# Patient Record
Sex: Male | Born: 1945
Health system: Southern US, Community
[De-identification: ages and names within clinical notes are randomized; demographics above are authoritative.]

## PROBLEM LIST (undated history)

## (undated) DIAGNOSIS — I10 Essential (primary) hypertension: Secondary | ICD-10-CM

## (undated) DIAGNOSIS — G8929 Other chronic pain: Secondary | ICD-10-CM

## (undated) DIAGNOSIS — R51 Headache: Secondary | ICD-10-CM

## (undated) DIAGNOSIS — I639 Cerebral infarction, unspecified: Secondary | ICD-10-CM

## (undated) HISTORY — DX: Essential (primary) hypertension: I10

## (undated) HISTORY — DX: Cerebral infarction, unspecified: I63.9

## (undated) HISTORY — DX: Headache: R51

## (undated) HISTORY — PX: HERNIA REPAIR: SHX51

## (undated) HISTORY — DX: Other chronic pain: G89.29

---

## 2016-03-18 ENCOUNTER — Ambulatory Visit: Payer: Self-pay | Admitting: Family Medicine

## 2016-03-25 ENCOUNTER — Ambulatory Visit (INDEPENDENT_AMBULATORY_CARE_PROVIDER_SITE_OTHER): Payer: Medicare Other | Admitting: Family Medicine

## 2016-03-25 ENCOUNTER — Encounter: Payer: Self-pay | Admitting: Family Medicine

## 2016-03-25 VITALS — BP 204/112 | HR 110 | Temp 98.3°F | Ht 70.0 in | Wt 224.4 lb

## 2016-03-25 DIAGNOSIS — R000 Tachycardia, unspecified: Secondary | ICD-10-CM

## 2016-03-25 DIAGNOSIS — I10 Essential (primary) hypertension: Secondary | ICD-10-CM

## 2016-03-25 DIAGNOSIS — N5089 Other specified disorders of the male genital organs: Secondary | ICD-10-CM

## 2016-03-25 DIAGNOSIS — Z1159 Encounter for screening for other viral diseases: Secondary | ICD-10-CM

## 2016-03-25 DIAGNOSIS — Z13220 Encounter for screening for lipoid disorders: Secondary | ICD-10-CM | POA: Diagnosis not present

## 2016-03-25 DIAGNOSIS — N509 Disorder of male genital organs, unspecified: Secondary | ICD-10-CM

## 2016-03-25 DIAGNOSIS — Z1211 Encounter for screening for malignant neoplasm of colon: Secondary | ICD-10-CM | POA: Diagnosis not present

## 2016-03-25 DIAGNOSIS — I517 Cardiomegaly: Secondary | ICD-10-CM

## 2016-03-25 LAB — LIPID PANEL
CHOLESTEROL: 142 mg/dL (ref 0–200)
HDL: 48.8 mg/dL (ref >39.00)
LDL CALC: 69 mg/dL (ref 0–99)
NonHDL: 92.75
TRIGLYCERIDES: 118 mg/dL (ref 0.0–149.0)
Total CHOL/HDL Ratio: 3
VLDL: 23.6 mg/dL (ref 0.0–40.0)

## 2016-03-25 LAB — COMPREHENSIVE METABOLIC PANEL
ALBUMIN: 4.2 g/dL (ref 3.5–5.2)
ALK PHOS: 75 U/L (ref 39–117)
ALT: 18 U/L (ref 0–53)
AST: 23 U/L (ref 0–37)
BUN: 13 mg/dL (ref 6–23)
CHLORIDE: 107 meq/L (ref 96–112)
CO2: 23 mEq/L (ref 19–32)
Calcium: 9.5 mg/dL (ref 8.4–10.5)
Creatinine, Ser: 1.29 mg/dL (ref 0.40–1.50)
GFR: 70.28 mL/min (ref >60.00)
Glucose, Bld: 93 mg/dL (ref 70–99)
POTASSIUM: 4 meq/L (ref 3.5–5.1)
SODIUM: 137 meq/L (ref 135–145)
TOTAL PROTEIN: 8.2 g/dL (ref 6.0–8.3)
Total Bilirubin: 0.8 mg/dL (ref 0.2–1.2)

## 2016-03-25 LAB — MICROALBUMIN / CREATININE URINE RATIO
Creatinine,U: 204 mg/dL
MICROALB UR: 175.4 mg/dL — AB (ref 0.0–1.9)
Microalb Creat Ratio: 86 mg/g — ABNORMAL HIGH (ref 0.0–30.0)

## 2016-03-25 LAB — HEPATITIS C ANTIBODY: HCV AB: REACTIVE — AB

## 2016-03-25 MED ORDER — LISINOPRIL 20 MG PO TABS: 20.0000 mg | ORAL_TABLET | Freq: Every day | ORAL | 0 refills | Status: DC

## 2016-03-25 NOTE — Patient Instructions (Signed)

## 2016-03-25 NOTE — Progress Notes (Signed)
Chief Complaint  Patient presents with  . Establish Care       New Patient Visit SUBJECTIVE: HPI: Andrew Cuevas is an 71 y.o.male who is being seen for establishing care.  The patient was previously seen at office in [place]. He has not seen a physician in many years.  Pt has never had a colonoscopy.  He has no known medical concerns and takes no medications routinely.  Blood pressure He does not monitor BP at home and has never been told he has had an elevated BP. He does not take any BP medications. He does not routinely exercise. His diet could be improved. He specifically denies vision changes, nose bleeds, chest pain, jaw pain, arm pain, or shortness of breath.  He denies exertional symptoms. He has have a family history of high blood pressure. Ddenies any history of heart attack, stroke, coronary artery disease, or peripheral vascular disease.  He also notes these been experiencing a hernia over the past couple months. It is not painful and his bowel movements are normal. There are no skin changes over the area.  No Known Allergies  Past Medical History:  Diagnosis Date  . Chronic headaches    Past Surgical History:  Procedure Laterality Date  . HERNIA REPAIR     Social History   Social History  . Marital status: Widowed   Social History Main Topics  . Smoking status: Current Some Day Smoker    Packs/day: 0.50    Types: Cigarettes  . Smokeless tobacco: Never Used  . Alcohol use No  . Drug use: No   Family History  Problem Relation Age of Onset  . Hypertension Mother   . Arthritis Mother   . Alzheimer's disease Father     Current Outpatient Prescriptions:  .  Cholecalciferol (VITAMIN D3) 5000 units CAPS, Take by mouth., Disp: , Rfl:  .  lisinopril (PRINIVIL,ZESTRIL) 20 MG tablet, Take 1 tablet (20 mg total) by mouth daily., Disp: 30 tablet, Rfl: 0  ROS Cardiovascular: Denies chest pain  Respiratory: Denies dyspnea   OBJECTIVE: BP (!) 204/112 (BP  Location: Left Arm)   Pulse (!) 110   Temp 98.3 F (36.8 C) (Oral)   Ht 5\' 10"  (1.778 m)   Wt 224 lb 6.4 oz (101.8 kg)   BMI 32.20 kg/m   Constitutional: -  VS reviewed -  Well developed, well nourished, appears stated age -  No apparent distress  Psychiatric: -  Oriented to person, place, and time -  Memory intact -  Affect and mood normal -  Fluent conversation, good eye contact -  Judgment and insight age appropriate  Eye: -  Conjunctivae clear, no discharge -  Pupils symmetric, round, reactive to light  ENMT: -  Ears are patent b/l without erythema or discharge. TM's are shiny and clear b/l without evidence of effusion or infection. -  Oral mucosa without lesions, tongue and uvula midline    Tonsils not enlarged, no erythema, no exudate, trachea midline    Pharynx moist, no lesions, no erythema  Neck: -  No gross swelling, no palpable masses -  Thyroid midline, not enlarged, mobile, no palpable masses  Cardiovascular: -  RRR, no murmurs -  No LE edema  Respiratory: -  Normal respiratory effort, no accessory muscle use, no retraction -  Breath sounds equal, no wheezes, no ronchi, no crackles  GU: -  Right testicle is normal, left testicle is very large, does not transmit light, and is nontender to palpation -  I do not appreciate any bulges or signs of hernia and inguinal canal bilaterally   Neurological:  -  CN II - XII grossly intact -  Sensation grossly intact to light touch, equal bilaterally  Musculoskeletal: -  No clubbing, no cyanosis -  Gait normal  Skin: -  No significant lesion on inspection -  Warm and dry to palpation   ASSESSMENT/PLAN: Severe uncontrolled hypertension - Plan: Comprehensive metabolic panel, Microalbumin / creatinine urine ratio, EKG 12-Lead, lisinopril (PRINIVIL,ZESTRIL) 20 MG tablet  Need for hepatitis C screening test - Plan: Hepatitis C Antibody  Screening cholesterol level - Plan: Lipid panel  Essential hypertension  Special  screening for malignant neoplasms, colon - Plan: Ambulatory referral to Gastroenterology  Tachycardia  Testicular mass - Plan: [place] Art/Ven Flow Abd Pelv Doppler, US Scrotum, CANCELED: US Scrotum  LVH (left ventricular hypertrophy) - Plan: ECHOCARDIOGRAM COMPLETE  Patient instructed to sign release of records form from his previous PCP. The patient's blood pressure is very high. He is not having any signs or symptoms that would make me believe he needs further evaluation in the emergency department. After review of his EKG, I will get an echo to assess the his heart further. There are some abnormalities in addition to suggestion of LV hypertrophy based on voltage criteria in V5. He continues to deny any exertional symptoms. These are likely chronic changes stemming from not having seen a physician in many years. [place] to further characterize L testicle. I am not convinced that it is a hernia based on PE.  Patient should return in 1 week- will likely add another agent, HCTZ. We'll recheck electrolytes in the meantime. Both he and his brother were instructed to seek care in the ED if he starts having chest pain, SOB, jaw/arm pain. The patient voiced understanding and agreement to the plan.   Jilda Roche [place], DO 03/25/16  12:12 PM

## 2016-03-25 NOTE — Progress Notes (Signed)
Pre visit review using our clinic review tool, if applicable. No additional management support is needed unless otherwise documented below in the visit note. 

## 2016-03-26 ENCOUNTER — Telehealth: Payer: Self-pay | Admitting: Family Medicine

## 2016-03-26 DIAGNOSIS — R9431 Abnormal electrocardiogram [ECG] [EKG]: Secondary | ICD-10-CM

## 2016-03-26 NOTE — Progress Notes (Signed)
Cardiology Office Note   Date:  03/28/2016   ID:  Andrew Cuevas, DOB June 19, 1945, MRN [id number]  PCP:  Sharlene Dory, DO  Cardiologist:   Rollene Rotunda, MD  Referring:  Sharlene Dory, DO  Chief Complaint  Patient presents with  . Hypertension      History of Present Illness: Andrew Cuevas is a 71 y.o. male who presents for evaluation of difficult to control hypertension. He moved here from [place] just recently. Very reports having had a CVA but he doesn't have any details. He apparently has had hypertension. He hasn't been on medications.  He recently saw a new primary provider and was noted to have a blood pressure 204/112. He was started on Prinivil. Labs ordered which demonstrated an unremarkable basic metabolic profile. Echocardiography is pending. EKG demonstrated LVH with repolarization changes.  He was sent here for further management of his significant hypertension.  The patient has had a headache off-and-on for the last few months. He otherwise denies cardiovascular symptoms. He's not had any chest pressure, neck or arm discomfort. He doesn't have palpitations, presyncope or syncope. He denies any shortness of breath, PND or orthopnea. He has no weight gain or edema. He doesn't report any prior cardiac history.   Past Medical History:  Diagnosis Date  . Chronic headaches   . CVA (cerebral vascular accident) (HCC)   . HTN (hypertension), malignant     Past Surgical History:  Procedure Laterality Date  . HERNIA REPAIR Bilateral      Current Outpatient Prescriptions  Medication Sig Dispense Refill  . Cholecalciferol (VITAMIN D3) 5000 units CAPS Take by mouth.    Marland Kitchen lisinopril (PRINIVIL,ZESTRIL) 20 MG tablet Take 1 tablet (20 mg total) by mouth daily. 30 tablet 0  . cloNIDine (CATAPRES) 0.1 MG tablet Take 1 tablet (0.1 mg total) by mouth 2 (two) times daily. 60 tablet 11   Current Facility-Administered Medications  Medication Dose Route Frequency  Provider Last Rate Last Dose  . cloNIDine (CATAPRES) tablet 0.1 mg  0.1 mg Oral Daily Rollene Rotunda, MD   0.1 mg at 03/27/16 4696    Allergies:   Patient has no known allergies.    Social History:  The patient  reports that he has been smoking Cigarettes.  He has been smoking about 0.50 packs per day. He has never used smokeless tobacco. He reports that he does not drink alcohol or use drugs.   Family History:  The patient's family history includes Alzheimer's disease in his father; Arthritis in his mother; Hypertension in his mother.    ROS:  Please see the history of present illness.   Otherwise, review of systems are positive for none.   All other systems are reviewed and negative.    PHYSICAL EXAM: VS:  BP (!) 175/105 (BP Location: Left Arm, Patient Position: Supine, Cuff Size: Large)   Pulse 77   Ht 5\' 10"  (1.778 m)   Wt 224 lb 12.8 oz (102 kg)   BMI 32.26 kg/m  , BMI Body mass index is 32.26 kg/m. GENERAL:  Well appearing HEENT:  Pupils equal round and reactive, fundi not visualized, oral mucosa unremarkable NECK:  No jugular venous distention, waveform within normal limits, carotid upstroke brisk and symmetric, no bruits, no thyromegaly LYMPHATICS:  No cervical, inguinal adenopathy LUNGS:  Clear to auscultation bilaterally BACK:  No CVA tenderness CHEST:  Unremarkable HEART:  PMI not displaced or sustained,S1 and S2 within normal limits, no S3, positive S4, no clicks, no  rubs, no murmurs ABD:  Flat, positive bowel sounds normal in frequency in pitch, no bruits, no rebound, no guarding, no midline pulsatile mass, no hepatomegaly, no splenomegaly EXT:  2 plus pulses throughout, no edema, no cyanosis no clubbing SKIN:  No rashes no nodules NEURO:  Cranial nerves II through XII grossly intact, motor grossly intact throughout PSYCH:  Cognitively intact, oriented to person place and time    EKG:  EKG is not ordered today.  EKG 03/25/16 with NSR, rate 104, axis within normal  limits, left ventricular hypertrophy with repolarization changes, right bundle branch block     Recent Labs: 03/25/2016: ALT 18; BUN 13; Creatinine, Ser 1.29; Potassium 4.0; Sodium 137    Lipid Panel    Component Value Date/Time   CHOL 142 03/25/2016 1141   TRIG 118.0 03/25/2016 1141   HDL 48.80 03/25/2016 1141   CHOLHDL 3 03/25/2016 1141   VLDL 23.6 03/25/2016 1141   LDLCALC 69 03/25/2016 1141      Wt Readings from Last 3 Encounters:  03/27/16 224 lb 12.8 oz (102 kg)  03/25/16 224 lb 6.4 oz (101.8 kg)      Other studies Reviewed: Additional studies/ records that were reviewed today include: Labs. Review of the above records demonstrates:  Please see elsewhere in the note.     ASSESSMENT AND PLAN:  DIFFICULT TO CONTROL HTN:   The patient's blood pressure down using clonidine in the office. Because this seemed to work and he has significantly elevated blood pressures are going to start with clonidine 0.1 mg twice daily. Ultimately of probably like to get him off of this medication as we increase his other regimen. I suspect she'll need 3 or 4 drugs for management. I'll await the results of the echocardiogram. At this point, considering further imaging pending his response to blood pressure medications. I like to manage him in conjunction with Sharlene DoryNicholas Paul Wendling, DO.  They will be seeing him next week and he should have a basic metabolic profile. We might consider adding spironolactone as the next agent suspecting he has hyperreninemia.  We would need to watch his last surgery closely. He will come back here in 2 weeks for further blood pressure management as well. He was given instructions on when necessary use of the clonidine which is another added benefit to prescribing this medication. He's going to keep a blood pressure diary.  ABNORMAL EKG:    His EKG is likely related to LVH with repolarization. No further testing is planned at this point but I will await the results of the  echo.   Current medicines are reviewed at length with the patient today.  The patient does not have concerns regarding medicines.  The following changes have been made:  As above.   Labs/ tests ordered today include:  No orders of the defined types were placed in this encounter.    Disposition:   FU with APP in 2 weeks.      Signed, Rollene RotundaJames Zephaniah Enyeart, MD  03/28/2016 3:07 PM    [facility]

## 2016-03-26 NOTE — Telephone Encounter (Signed)
Called pt to inform him that I am referring him to cardiology for further evaluation of his abn EKG. He voiced understanding and agreement.

## 2016-03-27 ENCOUNTER — Encounter: Payer: Self-pay | Admitting: Cardiology

## 2016-03-27 ENCOUNTER — Ambulatory Visit (INDEPENDENT_AMBULATORY_CARE_PROVIDER_SITE_OTHER): Payer: Medicare Other | Admitting: Cardiology

## 2016-03-27 VITALS — BP 175/105 | HR 77 | Ht 70.0 in | Wt 224.8 lb

## 2016-03-27 DIAGNOSIS — I119 Hypertensive heart disease without heart failure: Secondary | ICD-10-CM

## 2016-03-27 DIAGNOSIS — I10 Essential (primary) hypertension: Secondary | ICD-10-CM | POA: Diagnosis not present

## 2016-03-27 MED ORDER — CLONIDINE HCL 0.1 MG PO TABS: 0.1000 mg | ORAL_TABLET | Freq: Two times a day (BID) | ORAL | 11 refills | Status: DC

## 2016-03-27 MED ORDER — CLONIDINE HCL 0.1 MG PO TABS
0.1000 mg | ORAL_TABLET | Freq: Every day | ORAL | Status: DC
  Administered 2016-03-27: 0.1 mg via ORAL

## 2016-03-27 NOTE — Patient Instructions (Addendum)
Medication Instructions:  START- Clonidine 0.1 mg twice a day take an extra dose if Systolic greater than 180  Labwork: None Ordered  Testing/Procedures: None Ordered  Follow-Up: Your physician recommends that you schedule a follow-up appointment in: 2 Weeks with APP   Any Other Special Instructions Will Be Listed Below (If Applicable).   If you need a refill on your cardiac medications before your next appointment, please call your pharmacy.

## 2016-03-28 ENCOUNTER — Encounter: Payer: Self-pay | Admitting: Cardiology

## 2016-03-28 DIAGNOSIS — I119 Hypertensive heart disease without heart failure: Secondary | ICD-10-CM | POA: Insufficient documentation

## 2016-03-28 DIAGNOSIS — I10 Essential (primary) hypertension: Secondary | ICD-10-CM | POA: Insufficient documentation

## 2016-03-31 LAB — HEPATITIS C RNA QUANTITATIVE

## 2016-04-01 ENCOUNTER — Ambulatory Visit: Payer: Medicare Other | Admitting: Family Medicine

## 2016-04-01 ENCOUNTER — Ambulatory Visit (HOSPITAL_BASED_OUTPATIENT_CLINIC_OR_DEPARTMENT_OTHER): Payer: Medicare Other

## 2016-04-01 ENCOUNTER — Ambulatory Visit (HOSPITAL_BASED_OUTPATIENT_CLINIC_OR_DEPARTMENT_OTHER): Admission: RE | Admit: 2016-04-01 | Payer: Medicare Other | Source: Ambulatory Visit

## 2016-04-01 ENCOUNTER — Other Ambulatory Visit (HOSPITAL_BASED_OUTPATIENT_CLINIC_OR_DEPARTMENT_OTHER): Payer: Medicare Other

## 2016-04-02 ENCOUNTER — Emergency Department (HOSPITAL_COMMUNITY): Payer: Medicare Other

## 2016-04-02 ENCOUNTER — Encounter (HOSPITAL_COMMUNITY): Payer: Self-pay

## 2016-04-02 ENCOUNTER — Inpatient Hospital Stay (HOSPITAL_COMMUNITY)
Admission: EM | Admit: 2016-04-02 | Discharge: 2016-04-08 | DRG: 065 | Disposition: A | Discharge status: Rehabilitation Facility | Payer: Medicare Other | Attending: Neurology | Admitting: Neurology

## 2016-04-02 DIAGNOSIS — F1410 Cocaine abuse, uncomplicated: Secondary | ICD-10-CM

## 2016-04-02 DIAGNOSIS — Z8249 Family history of ischemic heart disease and other diseases of the circulatory system: Secondary | ICD-10-CM | POA: Diagnosis not present

## 2016-04-02 DIAGNOSIS — Z6831 Body mass index (BMI) 31.0-31.9, adult: Secondary | ICD-10-CM

## 2016-04-02 DIAGNOSIS — D62 Acute posthemorrhagic anemia: Secondary | ICD-10-CM | POA: Diagnosis not present

## 2016-04-02 DIAGNOSIS — Z720 Tobacco use: Secondary | ICD-10-CM

## 2016-04-02 DIAGNOSIS — R066 Hiccough: Secondary | ICD-10-CM | POA: Diagnosis not present

## 2016-04-02 DIAGNOSIS — I6930 Unspecified sequelae of cerebral infarction: Secondary | ICD-10-CM | POA: Diagnosis not present

## 2016-04-02 DIAGNOSIS — R0902 Hypoxemia: Secondary | ICD-10-CM

## 2016-04-02 DIAGNOSIS — I161 Hypertensive emergency: Secondary | ICD-10-CM | POA: Diagnosis present

## 2016-04-02 DIAGNOSIS — F17210 Nicotine dependence, cigarettes, uncomplicated: Secondary | ICD-10-CM | POA: Diagnosis present

## 2016-04-02 DIAGNOSIS — N183 Chronic kidney disease, stage 3 (moderate): Secondary | ICD-10-CM | POA: Diagnosis not present

## 2016-04-02 DIAGNOSIS — G8929 Other chronic pain: Secondary | ICD-10-CM

## 2016-04-02 DIAGNOSIS — E669 Obesity, unspecified: Secondary | ICD-10-CM | POA: Diagnosis present

## 2016-04-02 DIAGNOSIS — I129 Hypertensive chronic kidney disease with stage 1 through stage 4 chronic kidney disease, or unspecified chronic kidney disease: Secondary | ICD-10-CM | POA: Diagnosis present

## 2016-04-02 DIAGNOSIS — I69393 Ataxia following cerebral infarction: Secondary | ICD-10-CM | POA: Diagnosis not present

## 2016-04-02 DIAGNOSIS — R938 Abnormal findings on diagnostic imaging of other specified body structures: Secondary | ICD-10-CM | POA: Diagnosis not present

## 2016-04-02 DIAGNOSIS — R51 Headache: Secondary | ICD-10-CM | POA: Diagnosis present

## 2016-04-02 DIAGNOSIS — I619 Nontraumatic intracerebral hemorrhage, unspecified: Secondary | ICD-10-CM

## 2016-04-02 DIAGNOSIS — F17200 Nicotine dependence, unspecified, uncomplicated: Secondary | ICD-10-CM | POA: Diagnosis not present

## 2016-04-02 DIAGNOSIS — N433 Hydrocele, unspecified: Secondary | ICD-10-CM | POA: Diagnosis present

## 2016-04-02 DIAGNOSIS — I639 Cerebral infarction, unspecified: Secondary | ICD-10-CM | POA: Diagnosis present

## 2016-04-02 DIAGNOSIS — I69398 Other sequelae of cerebral infarction: Secondary | ICD-10-CM | POA: Diagnosis not present

## 2016-04-02 DIAGNOSIS — E538 Deficiency of other specified B group vitamins: Secondary | ICD-10-CM

## 2016-04-02 DIAGNOSIS — F331 Major depressive disorder, recurrent, moderate: Secondary | ICD-10-CM | POA: Diagnosis not present

## 2016-04-02 DIAGNOSIS — I610 Nontraumatic intracerebral hemorrhage in hemisphere, subcortical: Principal | ICD-10-CM | POA: Diagnosis present

## 2016-04-02 DIAGNOSIS — R29810 Facial weakness: Secondary | ICD-10-CM | POA: Diagnosis present

## 2016-04-02 DIAGNOSIS — I10 Essential (primary) hypertension: Secondary | ICD-10-CM | POA: Diagnosis not present

## 2016-04-02 DIAGNOSIS — R05 Cough: Secondary | ICD-10-CM | POA: Diagnosis not present

## 2016-04-02 DIAGNOSIS — I5189 Other ill-defined heart diseases: Secondary | ICD-10-CM

## 2016-04-02 DIAGNOSIS — G8191 Hemiplegia, unspecified affecting right dominant side: Secondary | ICD-10-CM | POA: Diagnosis present

## 2016-04-02 DIAGNOSIS — N4400 Torsion of testis, unspecified: Secondary | ICD-10-CM | POA: Diagnosis present

## 2016-04-02 DIAGNOSIS — F329 Major depressive disorder, single episode, unspecified: Secondary | ICD-10-CM | POA: Diagnosis not present

## 2016-04-02 DIAGNOSIS — R471 Dysarthria and anarthria: Secondary | ICD-10-CM | POA: Diagnosis present

## 2016-04-02 DIAGNOSIS — Z820 Family history of epilepsy and other diseases of the nervous system: Secondary | ICD-10-CM | POA: Diagnosis not present

## 2016-04-02 DIAGNOSIS — N182 Chronic kidney disease, stage 2 (mild): Secondary | ICD-10-CM | POA: Diagnosis present

## 2016-04-02 DIAGNOSIS — I6789 Other cerebrovascular disease: Secondary | ICD-10-CM | POA: Diagnosis not present

## 2016-04-02 LAB — I-STAT TROPONIN, ED: TROPONIN I, POC: 0 ng/mL (ref 0.00–0.08)

## 2016-04-02 LAB — HEPATIC FUNCTION PANEL
ALK PHOS: 64 U/L (ref 38–126)
ALT: 17 U/L (ref 17–63)
AST: 24 U/L (ref 15–41)
Albumin: 3.8 g/dL (ref 3.5–5.0)
BILIRUBIN DIRECT: 0.1 mg/dL (ref 0.1–0.5)
BILIRUBIN INDIRECT: 0.7 mg/dL (ref 0.3–0.9)
BILIRUBIN TOTAL: 0.8 mg/dL (ref 0.3–1.2)
TOTAL PROTEIN: 7.6 g/dL (ref 6.5–8.1)

## 2016-04-02 LAB — CBC WITH DIFFERENTIAL/PLATELET
BASOS ABS: 0 10*3/uL (ref 0.0–0.1)
Basophils Relative: 0 %
EOS PCT: 1 %
Eosinophils Absolute: 0 10*3/uL (ref 0.0–0.7)
HEMATOCRIT: 37.2 % — AB (ref 39.0–52.0)
Hemoglobin: 12 g/dL — ABNORMAL LOW (ref 13.0–17.0)
LYMPHS ABS: 2.7 10*3/uL (ref 0.7–4.0)
LYMPHS PCT: 53 %
MCH: 25.4 pg — ABNORMAL LOW (ref 26.0–34.0)
MCHC: 32.3 g/dL (ref 30.0–36.0)
MCV: 78.6 fL (ref 78.0–100.0)
MONO ABS: 0.5 10*3/uL (ref 0.1–1.0)
MONOS PCT: 10 %
NEUTROS ABS: 1.9 10*3/uL (ref 1.7–7.7)
Neutrophils Relative %: 36 %
PLATELETS: 234 10*3/uL (ref 150–400)
RBC: 4.73 MIL/uL (ref 4.22–5.81)
RDW: 16 % — AB (ref 11.5–15.5)
WBC: 5.2 10*3/uL (ref 4.0–10.5)

## 2016-04-02 LAB — BASIC METABOLIC PANEL
ANION GAP: 10 (ref 5–15)
BUN: 13 mg/dL (ref 6–20)
CALCIUM: 9.5 mg/dL (ref 8.9–10.3)
CO2: 20 mmol/L — AB (ref 22–32)
Chloride: 105 mmol/L (ref 101–111)
Creatinine, Ser: 1.39 mg/dL — ABNORMAL HIGH (ref 0.61–1.24)
GFR calc Af Amer: 57 mL/min — ABNORMAL LOW (ref >60)
GFR calc non Af Amer: 49 mL/min — ABNORMAL LOW (ref >60)
GLUCOSE: 92 mg/dL (ref 65–99)
Potassium: 3.6 mmol/L (ref 3.5–5.1)
Sodium: 135 mmol/L (ref 135–145)

## 2016-04-02 LAB — PROTIME-INR
INR: 1.02
Prothrombin Time: 13.4 seconds (ref 11.4–15.2)

## 2016-04-02 LAB — MRSA PCR SCREENING: MRSA BY PCR: NEGATIVE

## 2016-04-02 MED ORDER — SENNOSIDES-DOCUSATE SODIUM 8.6-50 MG PO TABS
1.0000 | ORAL_TABLET | Freq: Two times a day (BID) | ORAL | Status: DC
  Administered 2016-04-02 – 2016-04-08 (×11): 1 via ORAL
  Filled 2016-04-02 (×11): qty 1

## 2016-04-02 MED ORDER — PANTOPRAZOLE SODIUM 40 MG IV SOLR
40.0000 mg | Freq: Every day | INTRAVENOUS | Status: DC
  Administered 2016-04-02 – 2016-04-03 (×2): 40 mg via INTRAVENOUS
  Filled 2016-04-02 (×2): qty 40

## 2016-04-02 MED ORDER — ACETAMINOPHEN 325 MG PO TABS
650.0000 mg | ORAL_TABLET | ORAL | Status: DC | PRN
  Administered 2016-04-06: 650 mg via ORAL
  Filled 2016-04-02: qty 2

## 2016-04-02 MED ORDER — NICARDIPINE HCL IN NACL 20-0.86 MG/200ML-% IV SOLN
3.0000 mg/h | INTRAVENOUS | Status: DC
  Administered 2016-04-02: 11.5 mg/h via INTRAVENOUS
  Administered 2016-04-02: 14 mg/h via INTRAVENOUS
  Administered 2016-04-02: 3 mg/h via INTRAVENOUS
  Administered 2016-04-03 (×2): 4.5 mg/h via INTRAVENOUS
  Administered 2016-04-03: 10 mg/h via INTRAVENOUS
  Administered 2016-04-03: 9.5 mg/h via INTRAVENOUS
  Administered 2016-04-03: 4.5 mg/h via INTRAVENOUS
  Administered 2016-04-03: 5 mg/h via INTRAVENOUS
  Administered 2016-04-03: 9.5 mg/h via INTRAVENOUS
  Administered 2016-04-04: 8 mg/h via INTRAVENOUS
  Administered 2016-04-04: 15 mg/h via INTRAVENOUS
  Administered 2016-04-04: 6 mg/h via INTRAVENOUS
  Filled 2016-04-02 (×5): qty 200
  Filled 2016-04-02: qty 400
  Filled 2016-04-02: qty 200
  Filled 2016-04-02: qty 600
  Filled 2016-04-02: qty 400

## 2016-04-02 MED ORDER — STROKE: EARLY STAGES OF RECOVERY BOOK
Freq: Once | Status: AC
  Administered 2016-04-02
  Filled 2016-04-02 (×2): qty 1

## 2016-04-02 MED ORDER — ACETAMINOPHEN 650 MG RE SUPP: 650.0000 mg | RECTAL | Status: DC | PRN

## 2016-04-02 MED ORDER — ACETAMINOPHEN 160 MG/5ML PO SOLN: 650.0000 mg | ORAL | Status: DC | PRN

## 2016-04-02 NOTE — ED Provider Notes (Signed)
[facility]-EMERGENCY DEPT Provider Note   CSN: [id number] Arrival date & time: 04/02/16  1255     History   Chief Complaint No chief complaint on file.   HPI Andrew Cuevas is a 71 y.o. male.  Andrew Cuevas is a 71 y.o. Male with a history of CVA, and HTN who presents to the ED complaining of scrotal pain for the past one to two months. He reports his pain is worse on his left inguinal area and radiates into his scrotum. He is a poor historian. He also reports a headache for several days. He has a history of a CVA and reports history of some right arm weakness. He denies any trouble speaking. He just recently established care with family medicine last week. He denies any trouble urinating. He denies fevers, chest pain, shortness of breath, abdominal pain, nausea, vomiting, difficulty urinating, dysuria, hematuria or rashes.   The history is provided by the patient and medical records. No language interpreter was used.    Past Medical History:  Diagnosis Date  . Chronic headaches   . CVA (cerebral vascular accident) (HCC)   . HTN (hypertension), malignant     Patient Active Problem List   Diagnosis Date Noted  . Stroke (cerebrum) (HCC) 04/02/2016  . Essential hypertension, malignant 03/28/2016  . Hypertensive heart disease without heart failure 03/28/2016    Past Surgical History:  Procedure Laterality Date  . HERNIA REPAIR Bilateral        Home Medications    Prior to Admission medications   Medication Sig Start Date End Date Taking? Authorizing Provider  acetaminophen (TYLENOL) 325 MG tablet Take 650 mg by mouth every 6 (six) hours as needed for mild pain.   Yes Historical Provider, MD  cloNIDine (CATAPRES) 0.1 MG tablet Take 1 tablet (0.1 mg total) by mouth 2 (two) times daily. 03/27/16  Yes Rollene Rotunda, MD  lisinopril (PRINIVIL,ZESTRIL) 20 MG tablet Take 1 tablet (20 mg total) by mouth daily. 03/25/16  Yes Sharlene Dory, DO    Family History Family History    Problem Relation Age of Onset  . Hypertension Mother   . Arthritis Mother   . Alzheimer's disease Father     Social History Social History  Substance Use Topics  . Smoking status: Current Some Day Smoker    Packs/day: 0.50    Types: Cigarettes  . Smokeless tobacco: Never Used  . Alcohol use No     Allergies   Patient has no known allergies.   Review of Systems Review of Systems  Constitutional: Negative for chills and fever.  HENT: Negative for congestion and sore throat.   Eyes: Negative for visual disturbance.  Respiratory: Negative for cough and shortness of breath.   Cardiovascular: Negative for chest pain.  Gastrointestinal: Negative for abdominal pain, diarrhea, nausea and vomiting.  Genitourinary: Positive for scrotal swelling and testicular pain. Negative for difficulty urinating, dysuria, hematuria and penile pain.  Musculoskeletal: Negative for back pain.  Skin: Negative for rash.  Neurological: Positive for headaches. Negative for dizziness.     Physical Exam Updated Vital Signs BP 122/65   Pulse 80   Temp 97.8 F (36.6 C) (Axillary)   Resp 12   SpO2 91%   Physical Exam  Constitutional: He appears well-developed and well-nourished. No distress.  Nontoxic appearing.  HENT:  Head: Normocephalic and atraumatic.  Mouth/Throat: Oropharynx is clear and moist.  Eyes: Conjunctivae and EOM are normal. Pupils are equal, round, and reactive to light. Right eye exhibits no  discharge. Left eye exhibits no discharge.  Neck: Neck supple.  Cardiovascular: Normal rate, regular rhythm, normal heart sounds and intact distal pulses.  Exam reveals no gallop and no friction rub.   No murmur heard. Pulmonary/Chest: Effort normal and breath sounds normal. No respiratory distress. He has no wheezes. He has no rales.  Abdominal: Soft. Bowel sounds are normal. He exhibits no mass. There is no tenderness. There is no guarding.  Abdomen is soft and nontender to palpation.   Genitourinary: Penis normal.  Genitourinary Comments: Patient has left-sided scrotal mass concerning for hernia versus hydrocele. Penis is uncircumcised. No GU rashes noted. No crepitus.  Musculoskeletal: He exhibits no edema.  Lymphadenopathy:    He has no cervical adenopathy.  Neurological: He is alert. Coordination normal.  Patient mumbles at times and can be difficult to understand. He has right grip strength weakness and some difficulty with right finger to nose. Unequal smile. Normal tongue protrusion. No drooling. Mild right leg weakness and normal left leg strength.   Skin: Skin is warm and dry. Capillary refill takes less than 2 seconds. No rash noted. He is not diaphoretic. No erythema. No pallor.  Psychiatric: He has a normal mood and affect. His behavior is normal.  Nursing note and vitals reviewed.    ED Treatments / Results  Labs (all labs ordered are listed, but only abnormal results are displayed) Labs Reviewed  CBC WITH DIFFERENTIAL/PLATELET - Abnormal; Notable for the following:       Result Value   Hemoglobin 12.0 (*)    HCT 37.2 (*)    MCH 25.4 (*)    RDW 16.0 (*)    All other components within normal limits  BASIC METABOLIC PANEL - Abnormal; Notable for the following:    CO2 20 (*)    Creatinine, Ser 1.39 (*)    GFR calc non Af Amer 49 (*)    GFR calc Af Amer 57 (*)    All other components within normal limits  MRSA PCR SCREENING  PROTIME-INR  HEPATIC FUNCTION PANEL  I-STAT TROPOININ, ED    EKG  EKG Interpretation  Date/Time:  Thursday April 02 2016 15:50:33 EST Ventricular Rate:  73 PR Interval:    QRS Duration: 151 QT Interval:  399 QTC Calculation: 440 R Axis:   56 Text Interpretation:  Sinus rhythm Probable left atrial enlargement Right bundle branch block Repol abnrm, probable ischemia, lateral leads Left ventricular hypertrophy Confirmed by Lincoln Brigham [phone]) on 04/02/2016 4:18:20 PM       Radiology Ct Head Wo Contrast  Result Date:  04/02/2016 CLINICAL DATA:  Headache, hypertension, facial droop EXAM: CT HEAD WITHOUT CONTRAST TECHNIQUE: Contiguous axial images were obtained from the base of the skull through the vertex without intravenous contrast. COMPARISON:  None. FINDINGS: Brain: There is and acute left basal ganglia hemorrhage measuring 4.4 x 1.6 cm with some surrounding edema. The ventricular system is diffusely prominent as are the cortical sulci indicative of diffuse atrophy. Moderate small vessel ischemic changes noted in the periventricular white matter. Normal variation cavum septum is noted. Vascular: No vascular abnormality is seen on this unenhanced study. The left middle cerebral artery is slightly more dense medially in the right of questionable significance. If further assessment is warranted MRI would be recommended. Skull: No acute calvarial abnormality is noted. Sinuses/Orbits: There does appear to be a small left frontal sinus osteoma present. Some mucosal thickening is present within a few left ethmoid air cells and there is fluid layering in the  posterior right maxillary sinus. Other: None IMPRESSION: 1. Acute left basal ganglial hemorrhage with mild surrounding edema. This may be hypertensive in origin. 2. Slightly more dense left middle cerebral artery proximally of questionable significance. Consider MRI if warranted. 3. Diffuse atrophy and small vessel disease. 4. Fluid within the right maxillary sinus. I called this report to Dr. Marijo File at the time of interpretation. Electronically Signed   By: Dwyane Dee M.D.   On: 04/02/2016 17:09   US Scrotum  Result Date: 04/02/2016 CLINICAL DATA:  One month history of left-sided scrotal pain. EXAM: SCROTAL ULTRASOUND DOPPLER ULTRASOUND OF THE TESTICLES TECHNIQUE: Complete ultrasound examination of the testicles, epididymis, and other scrotal structures was performed. Color and spectral Doppler ultrasound were also utilized to evaluate blood flow to the testicles. COMPARISON:   None. FINDINGS: Right testicle Measurements: 4.2 x 2.4 x 2.5 cm. Symmetric and homogeneous echotexture without focal lesion. Tiny, 3 mm, intratesticular cyst is noted. Patent arterial and venous blood flow. Left testicle Measurements: 5.6 x 2.8 x 2.7 cm. 1.3 x 1.1 x 1.5 cm intratesticular cyst. This is in capsulated and well circumscribed and consistent with a benign lesion. Right epididymis: Multiple epididymal cysts. The largest measures 1.5 x 1.3 x 1.3 cm. Left epididymis:  Multiple small epididymal cysts. Hydrocele:  Very large left hydrocele. Varicocele:  None visualized. Pulsed Doppler interrogation of both testes demonstrates normal low resistance arterial and venous waveforms bilaterally. IMPRESSION: 1. Small benign-appearing bilateral intratesticular cysts. 2. Numerous bilateral epididymal cysts. 3. Very large left-sided hydrocele. Electronically Signed   By: Rudie Meyer M.D.   On: 04/02/2016 17:08   [place] Art/ven Flow Abd Pelv Doppler  Result Date: 04/02/2016 CLINICAL DATA:  One month history of left-sided scrotal pain. EXAM: SCROTAL ULTRASOUND DOPPLER ULTRASOUND OF THE TESTICLES TECHNIQUE: Complete ultrasound examination of the testicles, epididymis, and other scrotal structures was performed. Color and spectral Doppler ultrasound were also utilized to evaluate blood flow to the testicles. COMPARISON:  None. FINDINGS: Right testicle Measurements: 4.2 x 2.4 x 2.5 cm. Symmetric and homogeneous echotexture without focal lesion. Tiny, 3 mm, intratesticular cyst is noted. Patent arterial and venous blood flow. Left testicle Measurements: 5.6 x 2.8 x 2.7 cm. 1.3 x 1.1 x 1.5 cm intratesticular cyst. This is in capsulated and well circumscribed and consistent with a benign lesion. Right epididymis: Multiple epididymal cysts. The largest measures 1.5 x 1.3 x 1.3 cm. Left epididymis:  Multiple small epididymal cysts. Hydrocele:  Very large left hydrocele. Varicocele:  None visualized. Pulsed Doppler interrogation  of both testes demonstrates normal low resistance arterial and venous waveforms bilaterally. IMPRESSION: 1. Small benign-appearing bilateral intratesticular cysts. 2. Numerous bilateral epididymal cysts. 3. Very large left-sided hydrocele. Electronically Signed   By: Rudie Meyer M.D.   On: 04/02/2016 17:08    Procedures Procedures (including critical care time)  Medications Ordered in ED Medications  nicardipine (CARDENE) 20mg  in 0.86% saline IV infusion (0.1 mg/ml) (14 mg/hr Intravenous New Bag/Given 04/02/16 2244)   stroke: mapping our early stages of recovery book (not administered)  acetaminophen (TYLENOL) tablet 650 mg (not administered)    Or  acetaminophen (TYLENOL) solution 650 mg (not administered)    Or  acetaminophen (TYLENOL) suppository 650 mg (not administered)  senna-docusate (Senokot-S) tablet 1 tablet (1 tablet Oral Given 04/02/16 2157)  pantoprazole (PROTONIX) injection 40 mg (40 mg Intravenous Given 04/02/16 2157)     Initial Impression / Assessment and Plan / ED Course  I have reviewed the triage vital signs and the  nursing notes.  Pertinent labs & imaging results that were available during my care of the patient were reviewed by me and considered in my medical decision making (see chart for details).   This  is a 71 y.o. Male with a history of CVA, and HTN who presents to the ED complaining of scrotal pain for the past one to two months. He reports his pain is worse on his left inguinal area and radiates into his scrotum. He is a poor historian. He also reports a headache for several days. He has a history of a CVA and reports history of some right arm weakness. He denies any trouble speaking.  On exam the patient is nontoxic-appearing. He is hypertensive in the 200s systolic. He is hard to understand at times and is mumbling speech. He has unequal smile. Right arm and leg weakness. Patient is unable to tell me how long this has been present for. He does have a  history of previous CVA. No mention of weakness in his most recent family medicine visit. Initially, unable to get ahold of family. No family at bedside. Will obtain stat CT head.  CT head shows acute left basal ganglial hemorrhage with mild surrounding edema. Likely hypertensive in origin. Patient is hypertensive. Place consult to neurology. Will start on Cardene drip with goal of systolic BP<140.   I consulted with neurohospitalist Dr. Amada JupiterKirkpatrick who will be by to see the patient. He agrees with plan for Cardene drip and goal of SBP <140.   I was able to get in contact with his brother who reports he has been off balance for a few days, but he is not sure if this was due to his scrotal pain. He denies any recent speech changes. This is likely new.   Scrotal ultrasound shows a very large left-sided hydrocele. No torsion.  Dr. Amada JupiterKirkpatrick at bedside and will admit patient to ICU. Patient and family agree with plan.   This patient was discussed with and evaluated by Dr. Madilyn Hookees who agrees with assessment and plan.    CRITICAL CARE Performed by: Lawana Chambersansie,Aracelys Glade Duncan   Total critical care time: 45 minutes  Critical care time was exclusive of separately billable procedures and treating other patients.  Critical care was necessary to treat or prevent imminent or life-threatening deterioration.  Critical care was time spent personally by me on the following activities: development of treatment plan with patient and/or surrogate as well as nursing, discussions with consultants, evaluation of patient's response to treatment, examination of patient, obtaining history from patient or surrogate, ordering and performing treatments and interventions, ordering and review of laboratory studies, ordering and review of radiographic studies, pulse oximetry and re-evaluation of patient's condition.  Final Clinical Impressions(s) / ED Diagnoses   Final diagnoses:  Left hydrocele  Hemorrhagic stroke [facility](HCC)     New Prescriptions Current Discharge Medication List       Everlene FarrierWilliam Shariyah Eland, PA-C 04/02/16 2355    Tilden FossaElizabeth Rees, MD 04/07/16 1037

## 2016-04-02 NOTE — ED Notes (Signed)
Patient transported to Ultrasound 

## 2016-04-02 NOTE — ED Triage Notes (Signed)
Patient here with scrotal pain intermittently x 1 month. denies dysuria. Found on arrival to be hypertensive, states that he takes his BP meds daily, alert and oriented.

## 2016-04-02 NOTE — ED Notes (Signed)
Brother at bedside speaking with EDP.

## 2016-04-02 NOTE — ED Notes (Signed)
Attempted report 

## 2016-04-02 NOTE — H&P (Addendum)
Neurology H&P  CC: Right-sided weakness  History is obtained from: Patient  HPI: Andrew Cuevas is a 71 y.o. male with an unclear last seen well given that he did not really notice the symptoms that much but does state that he has had worse difficult to walking recently. It is suspected that after arriving here, he developed right-sided mild hemiparesis but has been stable over the past few hours. A CT was done which showed a basal ganglia hemorrhage.  He actually presented due to testicular pain which the ED evaluated and feels is hydrocele with no need for acute management.   LKW: Unclear tpa given?: no, ICH ICH Score: 0   ROS: A 14 point ROS was performed and is negative except as noted in the HPI.  Past Medical History:  Diagnosis Date  . Chronic headaches   . CVA (cerebral vascular accident) (HCC)   . HTN (hypertension), malignant      Family History  Problem Relation Age of Onset  . Hypertension Mother   . Arthritis Mother   . Alzheimer's disease Father      Social History:  reports that he has been smoking Cigarettes.  He has been smoking about 0.50 packs per day. He has never used smokeless tobacco. He reports that he does not drink alcohol or use drugs.   Exam: Current vital signs: BP 150/65   Pulse 99   Temp 97.5 F (36.4 C) (Oral)   Resp 14   SpO2 97%  Vital signs in last 24 hours: Temp:  [97.5 F (36.4 C)] 97.5 F (36.4 C) (02/01 1307) Pulse Rate:  [72-99] 99 (02/01 1835) Resp:  [13-30] 14 (02/01 1835) BP: (148-245)/(65-133) 150/65 (02/01 1835) SpO2:  [96 %-100 %] 97 % (02/01 1835)  Physical Exam  Constitutional: Appears well-developed and well-nourished.  Psych: Affect appropriate to situation Eyes: No scleral injection HENT: No OP obstrucion Head: Normocephalic.  Cardiovascular: Normal rate and regular rhythm.  Respiratory: Effort normal and breath sounds normal to anterior ascultation GI: Soft.  No distension. There is no tenderness.  Skin:  WDI  Neuro: Mental Status: Patient is awake, alert, interactive and appropriate Patient is able to give a clear and coherent history. No signs of aphasia or neglect Cranial Nerves: II: Visual Fields are full. Pupils are equal, round, and reactive to light.   III,IV, VI: EOMI without ptosis or diploplia.  V: Facial sensation is symmetric to temperature VII: Facial movement with mild right facial weakness VIII: hearing is intact to voice X: Uvula elevates symmetrically XI: Shoulder shrug is symmetric. XII: tongue is midline without atrophy or fasciculations.  Motor: Tone is normal. Bulk is normal. 5/5 strength was present on the left. On the right, he has a mild 4/5 paresis of the arm and leg.  Sensory: Sensation is symmetric to light touch and temperature in the arms and legs. Cerebellar: FNF consistent with weakness on the right, intact on the left   I have reviewed labs in epic and the results pertinent to this consultation are: Cr 1.4  I have reviewed the images obtained:CT head- left BG hemorrhage.   Impression: 71 year old male with left basal ganglia hemorrhage. I suspect that this is a hypertensive hemorrhage. He is being admitted to ICU for further management.  Recommendations: 1) Admit to ICU 2) no antiplatelets or anticoagulants 3) blood pressure control with goal systolic 120 -140 4) Frequent neuro checks 5) If symptoms worsen or there is decreased mental status, repeat stat head CT 6) PT,OT,ST  This patient is critically ill and at significant risk of neurological worsening, death and care requires constant monitoring of vital signs, hemodynamics,respiratory and cardiac monitoring, neurological assessment, discussion with family, other specialists and medical decision making of high complexity. I spent 45 minutes of neurocritical care time  in the care of  this patient.  Ritta SlotMcNeill Kirkpatrick, MD [facility] [phone]  If 7pm- 7am, please page  neurology on call as listed in AMION. 04/02/2016  7:02 PM

## 2016-04-03 ENCOUNTER — Inpatient Hospital Stay (HOSPITAL_COMMUNITY): Payer: Medicare Other

## 2016-04-03 DIAGNOSIS — I161 Hypertensive emergency: Secondary | ICD-10-CM

## 2016-04-03 DIAGNOSIS — N5082 Scrotal pain: Secondary | ICD-10-CM

## 2016-04-03 DIAGNOSIS — I6789 Other cerebrovascular disease: Secondary | ICD-10-CM

## 2016-04-03 DIAGNOSIS — F17200 Nicotine dependence, unspecified, uncomplicated: Secondary | ICD-10-CM

## 2016-04-03 LAB — CBC
HCT: 35.9 % — ABNORMAL LOW (ref 39.0–52.0)
Hemoglobin: 11.5 g/dL — ABNORMAL LOW (ref 13.0–17.0)
MCH: 25.2 pg — ABNORMAL LOW (ref 26.0–34.0)
MCHC: 32 g/dL (ref 30.0–36.0)
MCV: 78.7 fL (ref 78.0–100.0)
PLATELETS: 227 10*3/uL (ref 150–400)
RBC: 4.56 MIL/uL (ref 4.22–5.81)
RDW: 16.6 % — AB (ref 11.5–15.5)
WBC: 3.9 10*3/uL — AB (ref 4.0–10.5)

## 2016-04-03 LAB — VITAMIN B12: VITAMIN B 12: 136 pg/mL — AB (ref 180–914)

## 2016-04-03 LAB — RAPID URINE DRUG SCREEN, HOSP PERFORMED
Amphetamines: NOT DETECTED
Barbiturates: NOT DETECTED
Benzodiazepines: NOT DETECTED
COCAINE: POSITIVE — AB
OPIATES: NOT DETECTED
TETRAHYDROCANNABINOL: NOT DETECTED

## 2016-04-03 LAB — ECHOCARDIOGRAM COMPLETE
Height: 70 in
WEIGHTICAEL: 3460.34 [oz_av]

## 2016-04-03 LAB — BASIC METABOLIC PANEL
ANION GAP: 15 (ref 5–15)
BUN: 11 mg/dL (ref 6–20)
CALCIUM: 9.4 mg/dL (ref 8.9–10.3)
CO2: 22 mmol/L (ref 22–32)
Chloride: 102 mmol/L (ref 101–111)
Creatinine, Ser: 1.34 mg/dL — ABNORMAL HIGH (ref 0.61–1.24)
GFR, EST AFRICAN AMERICAN: 59 mL/min — AB (ref >60)
GFR, EST NON AFRICAN AMERICAN: 51 mL/min — AB (ref >60)
Glucose, Bld: 94 mg/dL (ref 65–99)
POTASSIUM: 4.1 mmol/L (ref 3.5–5.1)
SODIUM: 139 mmol/L (ref 135–145)

## 2016-04-03 LAB — TSH: TSH: 1.32 u[IU]/mL (ref 0.350–4.500)

## 2016-04-03 MED ORDER — LISINOPRIL 20 MG PO TABS
20.0000 mg | ORAL_TABLET | Freq: Every day | ORAL | Status: DC
  Administered 2016-04-04 – 2016-04-08 (×5): 20 mg via ORAL
  Filled 2016-04-03 (×5): qty 1

## 2016-04-03 MED ORDER — IOPAMIDOL (ISOVUE-370) INJECTION 76%
INTRAVENOUS | Status: AC
  Administered 2016-04-03: 50 mL
  Filled 2016-04-03: qty 50

## 2016-04-03 MED ORDER — CLONIDINE HCL 0.1 MG PO TABS
0.1000 mg | ORAL_TABLET | Freq: Two times a day (BID) | ORAL | Status: DC
  Administered 2016-04-03 – 2016-04-08 (×10): 0.1 mg via ORAL
  Filled 2016-04-03 (×10): qty 1

## 2016-04-03 NOTE — Progress Notes (Signed)
OT Cancellation Note  Patient Details Name: Andrew AbbottJohn Chill MRN: [id number] DOB: Nov 12, 1945   Cancelled Treatment:    Reason Eval/Treat Not Completed: Patient not medically ready. Pt on strict bedrest. Please update activity orders when appropriate for therapy. Thanks.  [facility] CenterWARD,HILLARY  Graiden Henes, OT/L  [phone] 04/03/2016 04/03/2016, 7:49 AM

## 2016-04-03 NOTE — Progress Notes (Signed)
PT Cancellation Note  Patient Details Name: Andrew AbbottJohn Cuevas MRN: [id number] DOB: [birth year]   Cancelled Treatment:    Reason Eval/Treat Not Completed: Patient not medically ready  Pt on bedrest. Will await increase in activity orders prior to PT evaluation.  Blake DivineShauna A Ridhaan Dreibelbis 04/03/2016, 7:14 AM Mylo RedShauna Secilia Apps, PT, DPT [phone]

## 2016-04-03 NOTE — Evaluation (Signed)
Speech Language Pathology Evaluation Patient Details Name: Andrew AbbottJohn Cuevas MRN: [id number] DOB: [birth year] Today's Date: 04/03/2016 Time: 1142-1209 SLP Time Calculation (min) (ACUTE ONLY): 27 min  Problem List:  Patient Active Problem List   Diagnosis Date Noted  . Stroke (cerebrum) (HCC) 04/02/2016  . Essential hypertension, malignant 03/28/2016  . Hypertensive heart disease without heart failure 03/28/2016   Past Medical History:  Past Medical History:  Diagnosis Date  . Chronic headaches   . CVA (cerebral vascular accident) (HCC)   . HTN (hypertension), malignant    Past Surgical History:  Past Surgical History:  Procedure Laterality Date  . HERNIA REPAIR Bilateral    HPI:  Pt is a 71 y.o.malewith PMH of chronic headaches, CVA, HTN, and cigarette smoker, who presents with right-sided weakness. Suspected that after arriving in ED, he developed right-sided mild hemiparesis but has been stable over the past few hours. CT of head showed acute left basal ganglial hemorrhage with mild surrounding edema (may be hypertensive in origin), slightly more dense left MCA proximally of questionable significance, diffuse atrophy, small vessel disease, and fluid within right maxillary sinus.   Assessment / Plan / Recommendation Clinical Impression  Andrew Cuevas denied difficulty with his cognition/thinking and speech abilities. Oral motor assessment revealed a mild impairment (reduced right facial ROM and abnormal symmetry on right). SLP administered the Cognistat to assess the pt's cognitive and language abilities. Results revealed deficits in the following domains: mild-moderate attentional impairments and severe impairments in repetition, memory, calculations, and reasoning (similarities). Category prompts and multiple choice cues were not beneficial (except for one instance) on the memory task. No family member or caregiver was present to comment on Andrew Cuevas baseline level of cognitive  functioning. ST cogntive therapy is recommended to address noted deficits in memory, attention, and reasoning by utilizing interventions such as memory strategies/aids and functional tasks.     SLP Assessment  Patient needs continued Speech Lanaguage Pathology Services    Follow Up Recommendations  Other (comment) (TBD)    Frequency and Duration min 2x/week  2 weeks      SLP Evaluation Cognition  Overall Cognitive Status: No family/caregiver present to determine baseline cognitive functioning Arousal/Alertness: Awake/alert Orientation Level: Oriented to person;Oriented to place;Oriented to situation;Disoriented to time Attention: Sustained Sustained Attention: Impaired Sustained Attention Impairment: Verbal basic Memory: Impaired Memory Impairment: Storage deficit;Retrieval deficit;Decreased short term memory Decreased Short Term Memory: Verbal basic Awareness: Impaired Awareness Impairment: Intellectual impairment Problem Solving: Appears intact Safety/Judgment: Appears intact       Comprehension  Auditory Comprehension Overall Auditory Comprehension: Appears within functional limits for tasks assessed Yes/No Questions: Not tested Commands: Within Functional Limits Conversation: Complex Visual Recognition/Discrimination Discrimination: Not tested Reading Comprehension Reading Status: Not tested    Expression Expression Primary Mode of Expression: Verbal Verbal Expression Overall Verbal Expression: Impaired Initiation: No impairment Level of Generative/Spontaneous Verbalization: Sentence Repetition: Impaired Level of Impairment: Sentence level Naming: No impairment Pragmatics: No impairment Written Expression Written Expression: Not tested   Oral / Motor  Oral Motor/Sensory Function Overall Oral Motor/Sensory Function: Mild impairment Facial ROM: Reduced right Facial Symmetry: Abnormal symmetry right Facial Strength: Within Functional Limits Facial Sensation:  Reduced right (mild) Lingual ROM: Within Functional Limits Lingual Symmetry: Within Functional Limits Lingual Strength: Within Functional Limits Velum: Within Functional Limits Motor Speech Overall Motor Speech: Appears within functional limits for tasks assessed Respiration: Within functional limits Phonation: Low vocal intensity Resonance: Within functional limits Articulation: Within functional limitis Intelligibility: Intelligible Motor Planning: Witnin functional limits  GO                    Macarthur Critchley , Student-SLP 04/03/2016, 2:56 PM

## 2016-04-03 NOTE — Progress Notes (Signed)
  Echocardiogram 2D Echocardiogram has been performed.  Andrew Cuevas, Tony 04/03/2016, 10:42 AM

## 2016-04-03 NOTE — Progress Notes (Addendum)
STROKE TEAM PROGRESS NOTE   HISTORY OF PRESENT ILLNESS (per record) Andrew Cuevas is a 71 y.o. male with an unclear last seen well given that he did not really notice the symptoms that much but does state that he has had worse difficult to walking recently. It is suspected that after arriving here, he developed right-sided mild hemiparesis but has been stable over the past few hours. A CT was done which showed a basal ganglia hemorrhage.He actually presented due to testicular pain which the ED evaluated and feels is hydrocele with no need for acute management. He was last known well is unclear. ICH Score: 0. He was admitted to the neuro ICU for further evaluation and treatment.   SUBJECTIVE (INTERVAL HISTORY) No family at bedside. Pt lying in bed with no distress. Mild right facial and right hand dexterity difficulty. Still complains of scrotal pain but mild now.    OBJECTIVE Temp:  [97.5 F (36.4 C)-98.4 F (36.9 C)] 98 F (36.7 C) (02/02 0800) Pulse Rate:  [68-106] 69 (02/02 0700) Cardiac Rhythm: Normal sinus rhythm (02/02 0636) Resp:  [12-30] 15 (02/02 0700) BP: (116-245)/(61-133) 124/65 (02/02 0700) SpO2:  [90 %-100 %] 91 % (02/02 0700) Weight:  [98.1 kg (216 lb 4.3 oz)] 98.1 kg (216 lb 4.3 oz) (02/01 2056)  CBC:  Recent Labs Lab 04/02/16 1308 04/03/16 0809  WBC 5.2 3.9*  NEUTROABS 1.9  --   HGB 12.0* 11.5*  HCT 37.2* 35.9*  MCV 78.6 78.7  PLT 234 227    Basic Metabolic Panel:  Recent Labs Lab 04/02/16 1308 04/03/16 0809  NA 135 139  K 3.6 4.1  CL 105 102  CO2 20* 22  GLUCOSE 92 94  BUN 13 11  CREATININE 1.39* 1.34*  CALCIUM 9.5 9.4    Lipid Panel:    Component Value Date/Time   CHOL 142 03/25/2016 1141   TRIG 118.0 03/25/2016 1141   HDL 48.80 03/25/2016 1141   CHOLHDL 3 03/25/2016 1141   VLDL 23.6 03/25/2016 1141   LDLCALC 69 03/25/2016 1141   HgbA1c: No results found for: HGBA1C Urine Drug Screen:    Component Value Date/Time   LABOPIA NONE DETECTED  04/03/2016 0800   COCAINSCRNUR POSITIVE (A) 04/03/2016 0800   LABBENZ NONE DETECTED 04/03/2016 0800   AMPHETMU NONE DETECTED 04/03/2016 0800   THCU NONE DETECTED 04/03/2016 0800   LABBARB NONE DETECTED 04/03/2016 0800      IMAGING I have personally reviewed the radiological images below and agree with the radiology interpretations.  Ct Head Wo Contrast 04/02/2016 1. Acute left basal ganglial hemorrhage with mild surrounding edema. This may be hypertensive in origin. 2. Slightly more dense left middle cerebral artery proximally of questionable significance. Consider MRI if warranted. 3. Diffuse atrophy and small vessel disease. 4. Fluid within the right maxillary sinus.   US Scrotum US Art/ven Flow Abd Pelv Doppler 04/02/2016 1. Small benign-appearing bilateral intratesticular cysts. 2. Numerous bilateral epididymal cysts. 3. Very large left-sided hydrocele.   CT angiogram head and neck No change in size of left basal ganglia intraparenchymal hematoma. The vessels are tortuous as might be seen with hypertension, but there is no evidence of atherosclerotic plaque, stenosis, dissection, aneurysm or vascular malformation.  2-D echocardiogram Left ventricle: The cavity size was normal. There was mild   concentric hypertrophy. Systolic function was vigorous. The   estimated ejection fraction was in the range of 65% to 70%. Wall   motion was normal; there were no regional wall motion  abnormalities. Features are consistent with a pseudonormal left   ventricular filling pattern, with concomitant abnormal relaxation   and increased filling pressure (grade 2 diastolic dysfunction).   Doppler parameters are consistent with high ventricular filling   pressure. - Aortic valve: Poorly visualized. Trileaflet; mildly thickened,   mildly calcified leaflets. - Aorta: Aortic root dimension: 45 mm (ED). - Aortic root: The aortic root was moderately dilated. - Left atrium: The atrium was severely  dilated. - Pulmonary arteries: Systolic pressure could not be accurately   estimated.   PHYSICAL EXAM  Temp:  [97.7 F (36.5 C)-98.4 F (36.9 C)] 98 F (36.7 C) (02/02 2000) Pulse Rate:  [68-103] 79 (02/02 2045) Resp:  [11-22] 15 (02/02 2045) BP: (114-178)/(61-110) 137/63 (02/02 2045) SpO2:  [89 %-98 %] 89 % (02/02 2045)  General - Well nourished, well developed, in no apparent distress.  Ophthalmologic - Fundi not visualized due to noncooperative.  Cardiovascular - Regular rate and rhythm.  Male reproductive system - enlarged scrotum bilaterally, no significant tenderness on palpation.   Mental Status -  Level of arousal and orientation to year, age, place, and person were intact, but not orientated to month. Language including expression, naming, repetition, comprehension was assessed and found intact, but mild dysarthria.  Cranial Nerves II - XII - II - Visual field intact OU. III, IV, VI - Extraocular movements intact. V - Facial sensation intact bilaterally. VII - right facial droop. VIII - Hearing & vestibular intact bilaterally. X - Palate elevates symmetrically, mild dysarthria. XI - Chin turning & shoulder shrug intact bilaterally. XII - Tongue protrusion intact.  Motor Strength - The patient's strength was normal in all extremities except right hand mild dexterity difficulty and pronator drift was absent.  Bulk was normal and fasciculations were absent.   Motor Tone - Muscle tone was assessed at the neck and appendages and was normal.  Reflexes - The patient's reflexes were 1+ in all extremities and he had no pathological reflexes  Sensory - Light touch, temperature/pinprick were assessed and were symmetrical.    Coordination - The patient had normal movements in the hands with no ataxia or dysmetria.  Tremor was absent.  Gait and Station - not tested.   ASSESSMENT/PLAN [name] is a 71 y.o. male with history of hypertension and chronic headaches  presenting with scrotal pain and difficulty walking. Developed right hemiparesis over the past few hours. CT found a L basal ganglia hemorrhage.  Stroke:  Left basal ganglia hemorrhage secondary to hypertensive emergency and cocaine abuse  Resultant  Right facial droop and right hand dexterity difficulty  CT head L basal ganglia hemorrhage   CTA head and neck  No AVM or aneurysm, stable hematoma  2D Echo  EF 65-70%  LDL 69  HgbA1c pending  SCDs for VTE prophylaxis  Diet NPO time specified  No antithrombotic prior to admission, now on No antithrombotic due to ICH  Ongoing aggressive stroke risk factor management  Therapy recommendations:  Pending  Disposition:  Pending  Cocaine abuse  UDS positive for cocaine  Cessation counseling will be provided  Hypertensive emergency  Blood pressure on arrival, 245/133) setting of neurologic symptom development   On cardene drip  Resume home meds with lisinopril and clonidine  Wean off cardene as able   Long-term BP goal normotensive  Tobacco abuse  Current smoker  Smoking cessation counseling provided  Pt is willing to quit  Scrotal pain  [place] - large left-sided hydrocele  No acute intervention  Follow up with PCP as outpt  Other Stroke Risk Factors  Advanced age  Obesity, Body mass index is 31.03 kg/m., recommend weight loss, diet and exercise as appropriate   Other Active Problems  CKD, stage I  Hospital day # 1  This patient is critically ill due to ICH, hypertensive emergency, cocaine use and at significant risk of neurological worsening, death form hematoma expansion, heart failure, brain edema. This patient's care requires constant monitoring of vital signs, hemodynamics, respiratory and cardiac monitoring, review of multiple databases, neurological assessment, discussion with family, other specialists and medical decision making of high complexity. I spent 35 minutes of neurocritical care time in the  care of this patient.  Andrew Cuevas PlanJindong Dekota Kirlin, MD PhD Stroke Neurology 04/03/2016 10:08 PM   To contact Stroke Continuity provider, please refer to [email or web address]. After hours, contact General Neurology

## 2016-04-03 NOTE — Care Management Note (Signed)
Case Management Note  Patient Details  Name: Andrew AbbottJohn Cuevas MRN: [id number] Date of Birth: [birth year]  Subjective/Objective:  Pt admitted on 04/02/16 with a Lt basal ganglia hemorrhage.  PTA, pt resided at home with sister.                    Action/Plan: Pt currently remains on strict BR; awaiting PT/OT consults to determine LOC needed at dc.  Will follow.    Expected Discharge Date:                  Expected Discharge Plan:  IP Rehab Facility  In-House Referral:     Discharge planning Services  CM Consult  Post Acute Care Choice:    Choice offered to:     DME Arranged:    DME Agency:     HH Arranged:    HH Agency:     Status of Service:  In process, will continue to follow  If discussed at Long Length of Stay Meetings, dates discussed:    Additional Comments:  Quintella BatonJulie W. Emoree Sasaki, RN, BSN  Trauma/Neuro ICU Case Manager [phone]

## 2016-04-04 DIAGNOSIS — I619 Nontraumatic intracerebral hemorrhage, unspecified: Secondary | ICD-10-CM

## 2016-04-04 LAB — HEMOGLOBIN A1C
Hgb A1c MFr Bld: 5.7 % — ABNORMAL HIGH (ref 4.8–5.6)
MEAN PLASMA GLUCOSE: 117 mg/dL

## 2016-04-04 LAB — BASIC METABOLIC PANEL
Anion gap: 9 (ref 5–15)
BUN: 12 mg/dL (ref 6–20)
CHLORIDE: 107 mmol/L (ref 101–111)
CO2: 23 mmol/L (ref 22–32)
CREATININE: 1.26 mg/dL — AB (ref 0.61–1.24)
Calcium: 9 mg/dL (ref 8.9–10.3)
GFR calc Af Amer: >60 mL/min (ref >60)
GFR calc non Af Amer: 55 mL/min — ABNORMAL LOW (ref >60)
Glucose, Bld: 93 mg/dL (ref 65–99)
POTASSIUM: 3.9 mmol/L (ref 3.5–5.1)
Sodium: 139 mmol/L (ref 135–145)

## 2016-04-04 LAB — CBC
HEMATOCRIT: 34.9 % — AB (ref 39.0–52.0)
HEMOGLOBIN: 11 g/dL — AB (ref 13.0–17.0)
MCH: 25 pg — AB (ref 26.0–34.0)
MCHC: 31.5 g/dL (ref 30.0–36.0)
MCV: 79.3 fL (ref 78.0–100.0)
Platelets: 224 10*3/uL (ref 150–400)
RBC: 4.4 MIL/uL (ref 4.22–5.81)
RDW: 16.5 % — ABNORMAL HIGH (ref 11.5–15.5)
WBC: 4.6 10*3/uL (ref 4.0–10.5)

## 2016-04-04 MED ORDER — LABETALOL HCL 5 MG/ML IV SOLN
10.0000 mg | INTRAVENOUS | Status: DC | PRN
  Administered 2016-04-05 (×2): 10 mg via INTRAVENOUS
  Filled 2016-04-04: qty 4

## 2016-04-04 MED ORDER — PANTOPRAZOLE SODIUM 40 MG PO TBEC
40.0000 mg | DELAYED_RELEASE_TABLET | Freq: Every day | ORAL | Status: DC
  Administered 2016-04-04 – 2016-04-08 (×5): 40 mg via ORAL
  Filled 2016-04-04 (×5): qty 1

## 2016-04-04 MED ORDER — ORAL CARE MOUTH RINSE
15.0000 mL | Freq: Two times a day (BID) | OROMUCOSAL | Status: DC
  Administered 2016-04-05 – 2016-04-08 (×5): 15 mL via OROMUCOSAL

## 2016-04-04 MED ORDER — NICARDIPINE HCL IN NACL 40-0.83 MG/200ML-% IV SOLN
3.0000 mg/h | INTRAVENOUS | Status: DC
  Administered 2016-04-04: 15 mg/h via INTRAVENOUS
  Administered 2016-04-04 (×2): 7.5 mg/h via INTRAVENOUS
  Administered 2016-04-04: 15 mg/h via INTRAVENOUS
  Filled 2016-04-04 (×3): qty 200

## 2016-04-04 NOTE — Progress Notes (Signed)
PT Cancellation Note  Patient Details Name: Andrew AbbottJohn Cuevas MRN: [id number] DOB: 03-06-1945   Cancelled Treatment:    Reason Eval/Treat Not Completed: Patient not medically ready.  Pt remains on bedrest.  Please advance activity order to allow for PT and mobility.     Alison MurrayMegan F Horrace [place], South CarolinaPT [phone] 04/04/2016, 11:28 AM

## 2016-04-04 NOTE — Progress Notes (Signed)
PHARMACIST - PHYSICIAN COMMUNICATION DR:   Roda ShuttersXu CONCERNING: Protonix IV to Oral Route Change Policy  RECOMMENDATION: This patient is receiving Protonix by the intravenous route.  Based on criteria approved by the Pharmacy and Therapeutics Committee, this drug is being converted to the equivalent oral dose form(s).  DESCRIPTION: These criteria include:  The patient is eating (either orally or via tube) and/or has been taking other orally administered medications for a least 24 hours  There is no active GI bleed or impaired GI absorption noted.   If you have questions about this conversion, please contact the Pharmacy Department  []   [phone]( [phone] )  [facility] [x]   [phone]( [phone] )  [facility]  []   [phone]  [facility] []   [phone]( [phone] )  [facility]

## 2016-04-04 NOTE — Progress Notes (Signed)
STROKE TEAM PROGRESS NOTE   HISTORY OF PRESENT ILLNESS (per record) Andrew Cuevas is a 71 y.o. male with an unclear last seen well given that he did not really notice the symptoms that much but does state that he has had worse difficult to walking recently. It is suspected that after arriving here, he developed right-sided mild hemiparesis but has been stable over the past few hours. A CT was done which showed a basal ganglia hemorrhage.He actually presented due to testicular pain which the ED evaluated and feels is hydrocele with no need for acute management. He was last known well is unclear. ICH Score: 0. He was admitted to the neuro ICU for further evaluation and treatment.   SUBJECTIVE (INTERVAL HISTORY) No family at bedside. Pt lying in bed with no distress. Mild right facial and right hand dexterity difficulty. Has no complaints at this time.  RN reports patient is on maximum dose of Cardene; however first dose of Lisinopril just given prior to discussion  OBJECTIVE Temp:  [98 F (36.7 C)-98.4 F (36.9 C)] 98.2 F (36.8 C) (02/03 1200) Pulse Rate:  [74-92] 85 (02/03 1200) Cardiac Rhythm: Normal sinus rhythm (02/03 1000) Resp:  [11-23] 13 (02/03 1200) BP: (114-178)/(60-119) 137/78 (02/03 1200) SpO2:  [89 %-98 %] 94 % (02/03 1200)  CBC:   Recent Labs Lab 04/02/16 1308 04/03/16 0809 04/04/16 0226  WBC 5.2 3.9* 4.6  NEUTROABS 1.9  --   --   HGB 12.0* 11.5* 11.0*  HCT 37.2* 35.9* 34.9*  MCV 78.6 78.7 79.3  PLT 234 227 224    Basic Metabolic Panel:   Recent Labs Lab 04/03/16 0809 04/04/16 0226  NA 139 139  K 4.1 3.9  CL 102 107  CO2 22 23  GLUCOSE 94 93  BUN 11 12  CREATININE 1.34* 1.26*  CALCIUM 9.4 9.0    Lipid Panel:     Component Value Date/Time   CHOL 142 03/25/2016 1141   TRIG 118.0 03/25/2016 1141   HDL 48.80 03/25/2016 1141   CHOLHDL 3 03/25/2016 1141   VLDL 23.6 03/25/2016 1141   LDLCALC 69 03/25/2016 1141   HgbA1c:  Lab Results  Component Value  Date   HGBA1C 5.7 (H) 04/03/2016   Urine Drug Screen:     Component Value Date/Time   LABOPIA NONE DETECTED 04/03/2016 0800   COCAINSCRNUR POSITIVE (A) 04/03/2016 0800   LABBENZ NONE DETECTED 04/03/2016 0800   AMPHETMU NONE DETECTED 04/03/2016 0800   THCU NONE DETECTED 04/03/2016 0800   LABBARB NONE DETECTED 04/03/2016 0800      IMAGING I have personally reviewed the radiological images below and agree with the radiology interpretations.  Ct Head Wo Contrast 04/02/2016 1. Acute left basal ganglial hemorrhage with mild surrounding edema. This may be hypertensive in origin. 2. Slightly more dense left middle cerebral artery proximally of questionable significance. Consider MRI if warranted. 3. Diffuse atrophy and small vessel disease. 4. Fluid within the right maxillary sinus.   US Scrotum US Art/ven Flow Abd Pelv Doppler 04/02/2016 1. Small benign-appearing bilateral intratesticular cysts. 2. Numerous bilateral epididymal cysts. 3. Very large left-sided hydrocele.   CT angiogram head and neck No change in size of left basal ganglia intraparenchymal hematoma. The vessels are tortuous as might be seen with hypertension, but there is no evidence of atherosclerotic plaque, stenosis, dissection, aneurysm or vascular malformation.  2-D echocardiogram Left ventricle: The cavity size was normal. There was mild   concentric hypertrophy. Systolic function was vigorous. The   estimated  ejection fraction was in the range of 65% to 70%. Wall   motion was normal; there were no regional wall motion   abnormalities. Features are consistent with a pseudonormal left   ventricular filling pattern, with concomitant abnormal relaxation   and increased filling pressure (grade 2 diastolic dysfunction).   Doppler parameters are consistent with high ventricular filling   pressure. - Aortic valve: Poorly visualized. Trileaflet; mildly thickened,   mildly calcified leaflets. - Aorta: Aortic root  dimension: 45 mm (ED). - Aortic root: The aortic root was moderately dilated. - Left atrium: The atrium was severely dilated. - Pulmonary arteries: Systolic pressure could not be accurately   estimated.   PHYSICAL EXAM  Temp:  [98 F (36.7 C)-98.4 F (36.9 C)] 98.2 F (36.8 C) (02/03 1200) Pulse Rate:  [74-92] 85 (02/03 1200) Resp:  [11-23] 13 (02/03 1200) BP: (114-178)/(60-119) 137/78 (02/03 1200) SpO2:  [89 %-98 %] 94 % (02/03 1200)  General - Well nourished, well developed, in no apparent distress.  Cardiovascular - Regular rate and rhythm. Pulmonary:  CTA; with exception of very mild end-expiratory wheeze GI:  Soft , NT , ND, normal bowel sounds Extremities:  No C/C/E  Male reproductive system - enlarged scrotum bilaterally, no significant tenderness on palpation.   Mental Status -  Level of arousal and orientation to year, age, place, and person were intact, but not orientated to month. Language including expression, naming, repetition, comprehension was assessed and found intact, but mild dysarthria.  Cranial Nerves II - XII - II - Visual field intact OU. III, IV, VI - Extraocular movements intact. V - Facial sensation intact bilaterally. VII - right facial droop. VIII - Hearing & vestibular intact bilaterally. X - Palate elevates symmetrically, mild dysarthria. XI - Chin turning & shoulder shrug intact bilaterally. XII - Tongue protrusion intact.  Motor Strength - The patient's strength was normal in all extremities except right hand mild dexterity difficulty and pronator drift was absent.  Bulk was normal and fasciculations were absent.   Motor Tone - Muscle tone was assessed at the neck and appendages and was normal.  Sensory - Light touch, temperature/pinprick were assessed and were symmetrical.    Coordination - The patient had normal movements in the hands with no ataxia or dysmetria.  Tremor was absent.  Gait and Station - not  tested.   ASSESSMENT/PLAN [name] is a 71 y.o. male with history of hypertension and chronic headaches presenting with scrotal pain and difficulty walking. Developed right hemiparesis over the past few hours. CT found a L basal ganglia hemorrhage.  Stroke:  Left basal ganglia hemorrhage secondary to hypertensive emergency and cocaine abuse  Resultant  Right facial droop and right hand dexterity difficulty  CT head L basal ganglia hemorrhage   CTA head and neck  No AVM or aneurysm, stable hematoma  2D Echo  EF 65-70%  LDL 69  HgbA1c 5.7  SCDs for VTE prophylaxis Diet regular Room service appropriate? Yes; Fluid consistency: Thin  No antithrombotic prior to admission, now on No antithrombotic due to ICH  Ongoing aggressive stroke risk factor management  Therapy recommendations:  Pending  Disposition:  Pending  Cocaine abuse  UDS positive for cocaine  Cessation counseling will be provided  Hypertensive emergency  Blood pressure on arrival, 245/133) setting of neurologic symptom development   On cardene drip  Resume home meds with lisinopril and clonidine  Wean off cardene as able   Long-term BP goal normotensive  Tobacco abuse  Current smoker  Smoking cessation counseling provided  Pt is willing to quit  Scrotal pain  US - large left-sided hydrocele  No acute intervention  Follow up with PCP as outpt  Other Stroke Risk Factors  Advanced age  Obesity, Body mass index is 31.03 kg/m., recommend weight loss, diet and exercise as appropriate   Other Active Problems  CKD, stage I  Hospital day # 2  This patient is critically ill due to ICH, hypertensive emergency, cocaine use and at significant risk of neurological worsening, death form hematoma expansion, heart failure, brain edema. This patient's care requires constant monitoring of vital signs, hemodynamics, respiratory and cardiac monitoring, review of multiple databases, neurological  assessment, discussion with family, other specialists and medical decision making of high complexity. I spent 35 minutes of neurocritical care time in the care of this patient.  To contact Stroke Continuity provider, please refer to [email or web address]. After hours, contact General Neurology

## 2016-04-05 ENCOUNTER — Inpatient Hospital Stay (HOSPITAL_COMMUNITY): Payer: Medicare Other

## 2016-04-05 DIAGNOSIS — R066 Hiccough: Secondary | ICD-10-CM

## 2016-04-05 DIAGNOSIS — R0902 Hypoxemia: Secondary | ICD-10-CM

## 2016-04-05 LAB — CBC
HCT: 33.8 % — ABNORMAL LOW (ref 39.0–52.0)
Hemoglobin: 10.7 g/dL — ABNORMAL LOW (ref 13.0–17.0)
MCH: 25.1 pg — AB (ref 26.0–34.0)
MCHC: 31.7 g/dL (ref 30.0–36.0)
MCV: 79.2 fL (ref 78.0–100.0)
PLATELETS: 216 10*3/uL (ref 150–400)
RBC: 4.27 MIL/uL (ref 4.22–5.81)
RDW: 16.3 % — AB (ref 11.5–15.5)
WBC: 6.8 10*3/uL (ref 4.0–10.5)

## 2016-04-05 LAB — BASIC METABOLIC PANEL
Anion gap: 8 (ref 5–15)
BUN: 11 mg/dL (ref 6–20)
CALCIUM: 9 mg/dL (ref 8.9–10.3)
CO2: 22 mmol/L (ref 22–32)
Chloride: 107 mmol/L (ref 101–111)
Creatinine, Ser: 1.29 mg/dL — ABNORMAL HIGH (ref 0.61–1.24)
GFR calc Af Amer: >60 mL/min (ref >60)
GFR, EST NON AFRICAN AMERICAN: 54 mL/min — AB (ref >60)
GLUCOSE: 108 mg/dL — AB (ref 65–99)
Potassium: 3.9 mmol/L (ref 3.5–5.1)
Sodium: 137 mmol/L (ref 135–145)

## 2016-04-05 NOTE — Evaluation (Signed)
Physical Therapy Evaluation Patient Details Name: Andrew Cuevas Sandra MRN: [id number] DOB: 1945/09/13 Today's Date: 04/05/2016   History of Present Illness  pt presents with L Basal Ganglia Infarct, HTN Emergency, with positive UDS for Cocaine.  pt had initially presented to ED with testicular pain and was found to have L Hydrocele.  pt with hx of Polysubstance, HTN, CVA, and Chronic Headaches.    Clinical Impression  Pt very soft spoken and difficult to understand at times.  Pt with difficulty following multi step directions and folling directions during mobility.  Pt able to ambulate, but benefited from having 2nd person present due to lines and level of A needed.  Feel pt would benefit from CIR level of therapies at D/C.  Will continue to follow.      Follow Up Recommendations CIR    Equipment Recommendations  None recommended by PT    Recommendations for Other Services Rehab consult     Precautions / Restrictions Precautions Precautions: Fall Restrictions Weight Bearing Restrictions: No      Mobility  Bed Mobility Overal bed mobility: Needs Assistance Bed Mobility: Supine to Sit     Supine to sit: Mod assist;HOB elevated     General bed mobility comments: pt initiates coming to sitting with only verbal cue, but is unable to bring trunk upright and needs A with trunk and scooting hips closer to EOB.    Transfers Overall transfer level: Needs assistance Equipment used: Rolling walker (2 wheeled) Transfers: Sit to/from Stand Sit to Stand: Mod assist;+2 safety/equipment         General transfer comment: Max cueing for safe technique.  pt tends to grab RW and use momentum to bring himself to standing and then uncontrolled descent to sitting.    Ambulation/Gait Ambulation/Gait assistance: Mod assist;+2 safety/equipment Ambulation Distance (Feet): 40 Feet Assistive device: Rolling walker (2 wheeled) Gait Pattern/deviations: Step-to pattern;Decreased step length -  right;Decreased dorsiflexion - right;Drifts right/left;Trunk flexed     General Gait Details: pt needs consistent ModA for balance and management of RW.  pt tends to push RW very far out in front of him and steps outside RW with turns despite max cueing and PT A with movement of RW.  2nd person present for management of lines, IV pole, and for safety.    Stairs            Wheelchair Mobility    Modified Rankin (Stroke Patients Only) Modified Rankin (Stroke Patients Only) Pre-Morbid Rankin Score: No significant disability (May need to clarify this with family) Modified Rankin: Moderately severe disability     Balance Overall balance assessment: Needs assistance Sitting-balance support: No upper extremity supported;Single extremity supported;Feet supported Sitting balance-Leahy Scale: Fair Sitting balance - Comments: pt needs UE support for dynamic balance.     Standing balance support: Bilateral upper extremity supported;During functional activity Standing balance-Leahy Scale: Poor                               Pertinent Vitals/Pain Pain Assessment: No/denies pain    Home Living Family/patient expects to be discharged to:: Inpatient rehab Living Arrangements: Other relatives               Additional Comments: pt idnicates he lives with his sister.    Prior Function Level of Independence: Needs assistance   Gait / Transfers Assistance Needed: pt indicates he has a RW, but that he is able to ambulate longer distances.  ADL's /  Homemaking Assistance Needed: pt states he performs his own ADLs, but that sister helps with homemaking tasks.    Comments: pt with difficulty answering questions, so may need to clarify PLOF with family.       Hand Dominance        Extremity/Trunk Assessment   Upper Extremity Assessment Upper Extremity Assessment: Defer to OT evaluation    Lower Extremity Assessment Lower Extremity Assessment: Generalized weakness;RLE  deficits/detail RLE Deficits / Details: Sensation intact, but difficulties with coordination and strength grossly 4-/5.   RLE Coordination: decreased fine motor;decreased gross motor    Cervical / Trunk Assessment Cervical / Trunk Assessment: Kyphotic  Communication   Communication: Expressive difficulties (Very soft spoken and mumbles.  Difficult to understand.  )  Cognition Arousal/Alertness: Awake/alert Behavior During Therapy: Flat affect Overall Cognitive Status: Impaired/Different from baseline Area of Impairment: Orientation;Attention;Memory;Following commands;Safety/judgement;Awareness;Problem solving Orientation Level: Disoriented to (pt knows hospital in [place], not name) Current Attention Level: Selective Memory: Decreased short-term memory Following Commands: Follows one step commands with increased time;Follows multi-step commands inconsistently Safety/Judgement: Decreased awareness of safety;Decreased awareness of deficits Awareness: Intellectual Problem Solving: Slow processing;Decreased initiation;Difficulty sequencing;Requires verbal cues;Requires tactile cues General Comments: Unclear pt's baseline level of cognition, but is currently impaired.  pt mentioned living in [place], but then states he has been living with his sister for "a while".      General Comments      Exercises     Assessment/Plan    PT Assessment Patient needs continued PT services  PT Problem List Decreased strength;Decreased activity tolerance;Decreased balance;Decreased mobility;Decreased coordination;Decreased cognition;Decreased knowledge of use of DME;Decreased safety awareness          PT Treatment Interventions DME instruction;Gait training;Stair training;Functional mobility training;Therapeutic activities;Therapeutic exercise;Balance training;Neuromuscular re-education;Cognitive remediation;Patient/family education    PT Goals (Current goals can be found in the Care Plan section)  Acute  Rehab PT Goals Patient Stated Goal: Get better. PT Goal Formulation: With patient Time For Goal Achievement: 04/19/16 Potential to Achieve Goals: Good    Frequency Min 4X/week   Barriers to discharge        Co-evaluation PT/OT/SLP Co-Evaluation/Treatment: Yes Reason for Co-Treatment: Complexity of the patient's impairments (multi-system involvement);For patient/therapist safety PT goals addressed during session: Mobility/safety with mobility;Balance;Proper use of DME         End of Session Equipment Utilized During Treatment: Gait belt Activity Tolerance: Patient tolerated treatment well Patient left: in chair;with call bell/phone within reach;with chair alarm set Nurse Communication: Mobility status         Time: 4098-1191 PT Time Calculation (min) (ACUTE ONLY): 29 min   Charges:   PT Evaluation $PT Eval Moderate Complexity: 1 Procedure     PT G CodesAlison Murray Gal Feldhaus, PT  [phone] 04/05/2016, 10:11 AM

## 2016-04-05 NOTE — Progress Notes (Addendum)
STROKE TEAM PROGRESS NOTE   HISTORY OF PRESENT ILLNESS (per record) Baran Kuhrt is a 71 y.o. male with an unclear last seen well given that he did not really notice the symptoms that much but does state that he has had worse difficult to walking recently. It is suspected that after arriving here, he developed right-sided mild hemiparesis but has been stable over the past few hours. A CT was done which showed a basal ganglia hemorrhage.He actually presented due to testicular pain which the ED evaluated and feels is hydrocele with no need for acute management. He was last known well is unclear. ICH Score: 0. He was admitted to the neuro ICU for further evaluation and treatment.   SUBJECTIVE (INTERVAL HISTORY) No family at bedside. Pt sitting up in chair.  Noted to have desaturation in the mid to high 80's.  On 2 liters [place] improved but still less than 92%.  Patient had no complaints on ROS except for ongoing hiccups.  STAT CXR ordered.  Off Cardene since 1AM today 04/05/2016   OBJECTIVE Temp:  [98 F (36.7 C)-99.5 F (37.5 C)] 98.5 F (36.9 C) (02/04 0802) Pulse Rate:  [76-101] 101 (02/04 0900) Cardiac Rhythm: Normal sinus rhythm (02/04 0400) Resp:  [12-27] 20 (02/04 0900) BP: (107-173)/(40-90) (P) 157/92 (02/04 0900) SpO2:  [87 %-99 %] (P) 90 % (02/04 0900)  CBC:   Recent Labs Lab 04/02/16 1308  04/04/16 0226 04/05/16 0328  WBC 5.2  < > 4.6 6.8  NEUTROABS 1.9  --   --   --   HGB 12.0*  < > 11.0* 10.7*  HCT 37.2*  < > 34.9* 33.8*  MCV 78.6  < > 79.3 79.2  PLT 234  < > 224 216  < > = values in this interval not displayed.  Basic Metabolic Panel:   Recent Labs Lab 04/04/16 0226 04/05/16 0328  NA 139 137  K 3.9 3.9  CL 107 107  CO2 23 22  GLUCOSE 93 108*  BUN 12 11  CREATININE 1.26* 1.29*  CALCIUM 9.0 9.0    Lipid Panel:     Component Value Date/Time   CHOL 142 03/25/2016 1141   TRIG 118.0 03/25/2016 1141   HDL 48.80 03/25/2016 1141   CHOLHDL 3 03/25/2016 1141    VLDL 23.6 03/25/2016 1141   LDLCALC 69 03/25/2016 1141   HgbA1c:  Lab Results  Component Value Date   HGBA1C 5.7 (H) 04/03/2016   Urine Drug Screen:     Component Value Date/Time   LABOPIA NONE DETECTED 04/03/2016 0800   COCAINSCRNUR POSITIVE (A) 04/03/2016 0800   LABBENZ NONE DETECTED 04/03/2016 0800   AMPHETMU NONE DETECTED 04/03/2016 0800   THCU NONE DETECTED 04/03/2016 0800   LABBARB NONE DETECTED 04/03/2016 0800      IMAGING I have personally reviewed the radiological images below and agree with the radiology interpretations.  Ct Head Wo Contrast 04/02/2016 1. Acute left basal ganglial hemorrhage with mild surrounding edema. This may be hypertensive in origin. 2. Slightly more dense left middle cerebral artery proximally of questionable significance. Consider MRI if warranted. 3. Diffuse atrophy and small vessel disease. 4. Fluid within the right maxillary sinus.   US Scrotum US Art/ven Flow Abd Pelv Doppler 04/02/2016 1. Small benign-appearing bilateral intratesticular cysts. 2. Numerous bilateral epididymal cysts. 3. Very large left-sided hydrocele.   CT angiogram head and neck No change in size of left basal ganglia intraparenchymal hematoma. The vessels are tortuous as might be seen with hypertension, but  there is no evidence of atherosclerotic plaque, stenosis, dissection, aneurysm or vascular malformation.  2-D echocardiogram Left ventricle: The cavity size was normal. There was mild   concentric hypertrophy. Systolic function was vigorous. The   estimated ejection fraction was in the range of 65% to 70%. Wall   motion was normal; there were no regional wall motion   abnormalities. Features are consistent with a pseudonormal left   ventricular filling pattern, with concomitant abnormal relaxation   and increased filling pressure (grade 2 diastolic dysfunction).   Doppler parameters are consistent with high ventricular filling   pressure. - Aortic valve: Poorly  visualized. Trileaflet; mildly thickened,   mildly calcified leaflets. - Aorta: Aortic root dimension: 45 mm (ED). - Aortic root: The aortic root was moderately dilated. - Left atrium: The atrium was severely dilated. - Pulmonary arteries: Systolic pressure could not be accurately   estimated.   PHYSICAL EXAM  Temp:  [98 F (36.7 C)-99.5 F (37.5 C)] 98.5 F (36.9 C) (02/04 0802) Pulse Rate:  [76-101] 101 (02/04 0900) Resp:  [12-27] 20 (02/04 0900) BP: (107-173)/(40-90) (P) 157/92 (02/04 0900) SpO2:  [87 %-99 %] (P) 90 % (02/04 0900)  General - Well nourished, well developed, in no apparent distress.  Cardiovascular - Regular rate and rhythm. Pulmonary:  Distant breath sounds GI:  Soft , NT , ND, normal bowel sounds Extremities:  No C/C/E  Male reproductive system - enlarged scrotum bilaterally, no significant tenderness on palpation.   Mental Status -  Level of arousal and orientation to year, age, place, and person were intact, but not orientated to month. Language including expression, naming, repetition, comprehension was assessed and found intact, but mild dysarthria.  Cranial Nerves II - XII - II - Visual field intact OU. III, IV, VI - Extraocular movements intact. V - Facial sensation intact bilaterally. VII - right facial droop. VIII - Hearing & vestibular intact bilaterally. X - Palate elevates symmetrically, mild dysarthria. XI - Chin turning & shoulder shrug intact bilaterally. XII - Tongue protrusion intact.  Motor Strength - The patient's strength was normal in all extremities except right hand mild dexterity difficulty and pronator drift was absent.  Bulk was normal and fasciculations were absent.   Motor Tone - Muscle tone was assessed at the neck and appendages and was normal.  Sensory - Light touch, temperature/pinprick were assessed and were symmetrical.    Coordination - The patient had normal movements in the hands with no ataxia or dysmetria.    Gait and Station - not tested.   ASSESSMENT/PLAN Mr. Carlis AbbottJohn Blevens is a 71 y.o. male with history of hypertension and chronic headaches presenting with scrotal pain and difficulty walking. Developed right hemiparesis over the past few hours. CT found a L basal ganglia hemorrhage.  Stroke:  Left basal ganglia hemorrhage secondary to hypertensive emergency and cocaine abuse  Resultant  Right facial droop and right hand dexterity difficulty  CT head L basal ganglia hemorrhage   CTA head and neck  No AVM or aneurysm, stable hematoma  2D Echo  EF 65-70%  LDL 69  HgbA1c 5.7  SCDs for VTE prophylaxis Diet regular Room service appropriate? Yes; Fluid consistency: Thin  No antithrombotic prior to admission, now on No antithrombotic due to ICH  Ongoing aggressive stroke risk factor management  Therapy recommendations:  Pending  Disposition:  Pending  Cocaine abuse  UDS positive for cocaine  Cessation counseling will be provided  Hypertensive emergency  Blood pressure on arrival, 245/133) setting of  neurologic symptom development   On cardene drip  Resume home meds with lisinopril and clonidine  Wean off cardene as able   Long-term BP goal normotensive  Tobacco abuse  Current smoker  Smoking cessation counseling provided  Pt is willing to quit  Scrotal pain  [place] - large left-sided hydrocele  No acute intervention  Follow up with PCP as outpt  Other Stroke Risk Factors  Advanced age  Obesity, Body mass index is 31.03 kg/m., recommend weight loss, diet and exercise as appropriate   Other Active Problems  CKD, stage I  New onset oxygen destaration  Hospital day # 3  This patient is critically ill due to ICH, hypertensive emergency, cocaine use and at significant risk of neurological worsening, death form hematoma expansion, heart failure, brain edema. This patient's care requires constant monitoring of vital signs, hemodynamics, respiratory and  cardiac monitoring, review of multiple databases, neurological assessment, discussion with family, other specialists and medical decision making of high complexity. I spent 35 minutes of neurocritical care time in the care of this patient.  To contact Stroke Continuity provider, please refer to [email or web address]. After hours, contact General Neurology

## 2016-04-05 NOTE — Evaluation (Signed)
Occupational Therapy Evaluation Patient Details Name: Andrew Cuevas MRN: [id number] DOB: 1945-08-04 Today's Date: 04/05/2016    History of Present Illness pt presents with L Basal Ganglia Infarct, HTN Emergency, with positive UDS for Cocaine.  pt had initially presented to ED with testicular pain and was found to have L Hydrocele.  pt with hx of Polysubstance, HTN, CVA, and Chronic Headaches.     Clinical Impression   Pt reports he was independent with BADL PTA. Currently pt requires mod assist +2 for functional mobility and min assist overall for ADL. Pt presenting with RUE weakness, impaired cognition, requires cues for sequencing and safety during all tasks, and poor standing balance impacting his independence and safety with ADL and functional mobility. Recommending CIR level therapies to maximize independence and safety with ADL and functional mobility prior to return home. Pt would benefit from continued skilled OT to address established goals.    Follow Up Recommendations  CIR;Supervision/Assistance - 24 hour    Equipment Recommendations  Other (comment) (TBD)    Recommendations for Other Services Rehab consult     Precautions / Restrictions Precautions Precautions: Fall Restrictions Weight Bearing Restrictions: No      Mobility Bed Mobility Overal bed mobility: Needs Assistance Bed Mobility: Supine to Sit     Supine to sit: Mod assist;HOB elevated     General bed mobility comments: pt initiates coming to sitting with only verbal cue, but is unable to bring trunk upright and needs A with trunk and scooting hips closer to EOB.    Transfers Overall transfer level: Needs assistance Equipment used: Rolling walker (2 wheeled) Transfers: Sit to/from Stand Sit to Stand: Mod assist;+2 safety/equipment         General transfer comment: Max cueing for safe technique.  pt tends to grab RW and use momentum to bring himself to standing and then uncontrolled descent to  sitting.      Balance Overall balance assessment: Needs assistance Sitting-balance support: No upper extremity supported;Single extremity supported;Feet supported Sitting balance-Leahy Scale: Fair Sitting balance - Comments: pt needs UE support for dynamic balance.     Standing balance support: Bilateral upper extremity supported;During functional activity Standing balance-Leahy Scale: Poor                              ADL Overall ADL's : Needs assistance/impaired Eating/Feeding: Set up;Sitting   Grooming: Min guard;Sitting   Upper Body Bathing: Min guard;Sitting   Lower Body Bathing: Minimal assistance;Sit to/from stand   Upper Body Dressing : Minimal assistance;Sitting   Lower Body Dressing: Minimal assistance;Sit to/from stand Lower Body Dressing Details (indicate cue type and reason): Pt able to don socks sitting EOB with min guard assist. Anticipate he would require assist in standing due to poor balance Toilet Transfer: Moderate assistance;+2 for safety/equipment;Ambulation;RW;Cueing for safety;Cueing for sequencing Toilet Transfer Details (indicate cue type and reason): Simulated by sit to stand from EOB with functional mobility         Functional mobility during ADLs: Moderate assistance;+2 for safety/equipment;Rolling walker;Cueing for safety;Cueing for sequencing       Vision Additional Comments: Needs further assessment   Perception     Praxis      Pertinent Vitals/Pain Pain Assessment: No/denies pain     Hand Dominance     Extremity/Trunk Assessment Upper Extremity Assessment Upper Extremity Assessment: RUE deficits/detail;Generalized weakness RUE Deficits / Details: grossly 3+/5. pt does not report sensation changes   Lower Extremity  Assessment Lower Extremity Assessment: Defer to PT evaluation   Cervical / Trunk Assessment Cervical / Trunk Assessment: Kyphotic   Communication Communication Communication: Expressive difficulties  (Very soft spoken and mumbles.  Difficult to understand.  )   Cognition Arousal/Alertness: Awake/alert Behavior During Therapy: Flat affect Overall Cognitive Status: Impaired/Different from baseline Area of Impairment: Orientation;Attention;Memory;Following commands;Safety/judgement;Awareness;Problem solving Orientation Level: Disoriented to (pt knows hospital in [place], not name) Current Attention Level: Selective Memory: Decreased short-term memory Following Commands: Follows one step commands with increased time;Follows multi-step commands inconsistently Safety/Judgement: Decreased awareness of safety;Decreased awareness of deficits Awareness: Intellectual Problem Solving: Slow processing;Decreased initiation;Difficulty sequencing;Requires verbal cues;Requires tactile cues General Comments: Unclear pt's baseline level of cognition, but is currently impaired.  pt mentioned living in [place], but then states he has been living with his sister for "a while".     General Comments       Exercises       Shoulder Instructions      Home Living Family/patient expects to be discharged to:: Inpatient rehab Living Arrangements: Other relatives                               Additional Comments: pt idnicates he lives with his sister.      Prior Functioning/Environment Level of Independence: Needs assistance  Gait / Transfers Assistance Needed: pt indicates he has a RW, but that he is able to ambulate longer distances. ADL's / Homemaking Assistance Needed: pt states he performs his own ADLs, but that sister helps with homemaking tasks.     Comments: pt with difficulty answering questions, so may need to clarify PLOF with family.          OT Problem List: Decreased strength;Decreased activity tolerance;Impaired balance (sitting and/or standing);Impaired vision/perception;Decreased coordination;Decreased cognition;Decreased safety awareness;Decreased knowledge of use of DME or  AE;Decreased knowledge of precautions;Impaired UE functional use   OT Treatment/Interventions: Self-care/ADL training;Therapeutic exercise;Neuromuscular education;Energy conservation;DME and/or AE instruction;Therapeutic activities;Cognitive remediation/compensation;Visual/perceptual remediation/compensation;Patient/family education;Balance training    OT Goals(Current goals can be found in the care plan section) Acute Rehab OT Goals Patient Stated Goal: Get better. OT Goal Formulation: With patient Time For Goal Achievement: 04/19/16 Potential to Achieve Goals: Good ADL Goals Pt Will Perform Grooming: with min guard assist;standing Pt Will Perform Upper Body Bathing: with supervision;sitting Pt Will Perform Lower Body Bathing: with min guard assist;sit to/from stand Pt Will Transfer to Toilet: with min guard assist;ambulating;bedside commode Pt Will Perform Toileting - Clothing Manipulation and hygiene: with min guard assist;sit to/from stand  OT Frequency: Min 2X/week   Barriers to D/C:            Co-evaluation PT/OT/SLP Co-Evaluation/Treatment: Yes Reason for Co-Treatment: Complexity of the patient's impairments (multi-system involvement);For patient/therapist safety   OT goals addressed during session: ADL's and self-care      End of Session Equipment Utilized During Treatment: Gait belt;Rolling walker Nurse Communication: Mobility status  Activity Tolerance: Patient tolerated treatment well Patient left: in chair;with call bell/phone within reach;with chair alarm set   Time: [id number] OT Time Calculation (min): 25 min Charges:  OT General Charges $OT Visit: 1 Procedure OT Evaluation $OT Eval Moderate Complexity: 1 Procedure G-Codes:     Gaye AlkenBailey A Riniyah Speich M.S., OTR/L Pager: [phone]  04/05/2016, 2:15 PM

## 2016-04-06 ENCOUNTER — Inpatient Hospital Stay (HOSPITAL_COMMUNITY): Payer: Medicare Other

## 2016-04-06 DIAGNOSIS — I5189 Other ill-defined heart diseases: Secondary | ICD-10-CM

## 2016-04-06 DIAGNOSIS — N433 Hydrocele, unspecified: Secondary | ICD-10-CM

## 2016-04-06 DIAGNOSIS — I519 Heart disease, unspecified: Secondary | ICD-10-CM

## 2016-04-06 DIAGNOSIS — Z720 Tobacco use: Secondary | ICD-10-CM

## 2016-04-06 DIAGNOSIS — D62 Acute posthemorrhagic anemia: Secondary | ICD-10-CM

## 2016-04-06 DIAGNOSIS — I610 Nontraumatic intracerebral hemorrhage in hemisphere, subcortical: Principal | ICD-10-CM

## 2016-04-06 DIAGNOSIS — I6930 Unspecified sequelae of cerebral infarction: Secondary | ICD-10-CM

## 2016-04-06 DIAGNOSIS — R51 Headache: Secondary | ICD-10-CM

## 2016-04-06 DIAGNOSIS — R519 Headache, unspecified: Secondary | ICD-10-CM

## 2016-04-06 DIAGNOSIS — I10 Essential (primary) hypertension: Secondary | ICD-10-CM

## 2016-04-06 DIAGNOSIS — I619 Nontraumatic intracerebral hemorrhage, unspecified: Secondary | ICD-10-CM

## 2016-04-06 DIAGNOSIS — G8929 Other chronic pain: Secondary | ICD-10-CM

## 2016-04-06 DIAGNOSIS — N182 Chronic kidney disease, stage 2 (mild): Secondary | ICD-10-CM

## 2016-04-06 DIAGNOSIS — R0902 Hypoxemia: Secondary | ICD-10-CM

## 2016-04-06 LAB — CBC
HEMATOCRIT: 32.6 % — AB (ref 39.0–52.0)
HEMOGLOBIN: 10.4 g/dL — AB (ref 13.0–17.0)
MCH: 25.1 pg — ABNORMAL LOW (ref 26.0–34.0)
MCHC: 31.9 g/dL (ref 30.0–36.0)
MCV: 78.7 fL (ref 78.0–100.0)
Platelets: 213 10*3/uL (ref 150–400)
RBC: 4.14 MIL/uL — ABNORMAL LOW (ref 4.22–5.81)
RDW: 16.3 % — AB (ref 11.5–15.5)
WBC: 5.7 10*3/uL (ref 4.0–10.5)

## 2016-04-06 LAB — BASIC METABOLIC PANEL
Anion gap: 7 (ref 5–15)
BUN: 11 mg/dL (ref 6–20)
CALCIUM: 8.8 mg/dL — AB (ref 8.9–10.3)
CHLORIDE: 106 mmol/L (ref 101–111)
CO2: 23 mmol/L (ref 22–32)
CREATININE: 1.3 mg/dL — AB (ref 0.61–1.24)
GFR calc Af Amer: >60 mL/min (ref >60)
GFR calc non Af Amer: 53 mL/min — ABNORMAL LOW (ref >60)
GLUCOSE: 96 mg/dL (ref 65–99)
Potassium: 3.8 mmol/L (ref 3.5–5.1)
Sodium: 136 mmol/L (ref 135–145)

## 2016-04-06 NOTE — Progress Notes (Signed)
Rehab Admissions Coordinator Note:  Patient was screened by Trish MageLogue, Jaidah Lomax M for appropriateness for an Inpatient Acute Rehab Consult.  At this time, we are recommending Inpatient Rehab consult.  Lelon FrohlichLogue, Nolen Lindamood M 04/06/2016, 8:24 AM  I can be reached at [phone].

## 2016-04-06 NOTE — Progress Notes (Signed)
Physical Therapy Treatment Patient Details Name: Andrew Cuevas MRN: [id number] DOB: [birth year] Today's Date: 04/06/2016    History of Present Illness pt presents with L Basal Ganglia Infarct, HTN Emergency, with positive UDS for Cocaine.  pt had initially presented to ED with testicular pain and was found to have L Hydrocele.  pt with hx of Polysubstance, HTN, CVA, and Chronic Headaches.      PT Comments    Pt agreeable to mobility.  Pt needs consistent cueing for safe technique and A for management of RW and balance during mobility.  Pt is not retaining information given to him by staff.  Continue to feel pt would benefit from CIR level of therapies at D/C.  Will continue to follow.    Follow Up Recommendations  CIR     Equipment Recommendations  None recommended by PT    Recommendations for Other Services       Precautions / Restrictions Precautions Precautions: Fall Restrictions Weight Bearing Restrictions: No    Mobility  Bed Mobility Overal bed mobility: Needs Assistance Bed Mobility: Supine to Sit     Supine to sit: Min assist     General bed mobility comments: A with coming to sitting and steadying him while scooting to EOB.    Transfers Overall transfer level: Needs assistance Equipment used: Rolling walker (2 wheeled) Transfers: Sit to/from Stand Sit to Stand: Mod assist;+2 safety/equipment         General transfer comment: cues for UE use and A for balance with coming to standing.  pt tends to reach out for RW and is uncoordinated at times.    Ambulation/Gait Ambulation/Gait assistance: Mod assist;+2 safety/equipment Ambulation Distance (Feet): 50 Feet Assistive device: Rolling walker (2 wheeled) Gait Pattern/deviations: Step-through pattern;Decreased stance time - right;Decreased step length - left;Decreased dorsiflexion - right;Trunk flexed;Drifts right/left     General Gait Details: pt with very small steps and pushes RW far out in front of him  despite verbal and tactile cues.  At times pt almost appears to have a festinating step and does better when given rhythmic R and L cues for moving each foot.  2nd person present for management of O2 and lines.     Stairs            Wheelchair Mobility    Modified Rankin (Stroke Patients Only) Modified Rankin (Stroke Patients Only) Pre-Morbid Rankin Score: No significant disability Modified Rankin: Moderately severe disability     Balance Overall balance assessment: Needs assistance Sitting-balance support: No upper extremity supported;Single extremity supported;Feet supported Sitting balance-Leahy Scale: Fair     Standing balance support: Bilateral upper extremity supported;During functional activity Standing balance-Leahy Scale: Poor                      Cognition Arousal/Alertness: Awake/alert Behavior During Therapy: Flat affect Overall Cognitive Status: Impaired/Different from baseline Area of Impairment: Orientation;Attention;Memory;Following commands;Safety/judgement;Awareness;Problem solving Orientation Level: Disoriented to (pt not retaining name of hospital after given to him.) Current Attention Level: Selective Memory: Decreased short-term memory Following Commands: Follows one step commands with increased time;Follows multi-step commands inconsistently Safety/Judgement: Decreased awareness of safety;Decreased awareness of deficits Awareness: Intellectual Problem Solving: Slow processing;Decreased initiation;Difficulty sequencing;Requires verbal cues;Requires tactile cues      Exercises      General Comments        Pertinent Vitals/Pain Pain Assessment: No/denies pain    Home Living  Prior Function            PT Goals (current goals can now be found in the care plan section) Acute Rehab PT Goals Patient Stated Goal: Get better. PT Goal Formulation: With patient Time For Goal Achievement: 04/19/16 Potential  to Achieve Goals: Good Progress towards PT goals: Progressing toward goals    Frequency    Min 4X/week      PT Plan Current plan remains appropriate    Co-evaluation             End of Session Equipment Utilized During Treatment: Gait belt Activity Tolerance: Patient tolerated treatment well Patient left: in chair;with call bell/phone within reach;with chair alarm set     Time: [id number] PT Time Calculation (min) (ACUTE ONLY): 28 min  Charges:  $Gait Training: 8-22 mins $Therapeutic Activity: 8-22 mins                    G CodesSunny Schlein:      Ranulfo Kall F Kayzen Kendzierski, [place] CarolinaPT [phone] 04/06/2016, 10:39 AM

## 2016-04-06 NOTE — Progress Notes (Signed)
*  Preliminary Results* Bilateral lower extremity venous duplex completed. Bilateral lower extremities are negative for deep vein thrombosis. There is no evidence of Baker's cyst bilaterally.  04/06/2016 1:13 PM Gertie FeyMichelle Aislinn Feliz, BS, RVT, RDCS, RDMS

## 2016-04-06 NOTE — Progress Notes (Signed)
STROKE TEAM PROGRESS NOTE   HISTORY OF PRESENT ILLNESS (per record) Terrence Wishon is a 71 y.o. male with an unclear last seen well given that he did not really notice the symptoms that much but does state that he has had worse difficult to walking recently. It is suspected that after arriving here, he developed right-sided mild hemiparesis but has been stable over the past few hours. A CT was done which showed a basal ganglia hemorrhage.He actually presented due to testicular pain which the ED evaluated and feels is hydrocele with no need for acute management. He was last known well is unclear. ICH Score: 0. He was admitted to the neuro ICU for further evaluation and treatment.   SUBJECTIVE (INTERVAL HISTORY) Patient lying in bed. No  Obvious distress. Medically stable to be transferred to the floor awaiting bed assignment. No family at the bedside.   OBJECTIVE Temp:  [97.4 F (36.3 C)-98.4 F (36.9 C)] 97.9 F (36.6 C) (02/05 0800) Pulse Rate:  [66-97] 78 (02/05 1000) Cardiac Rhythm: Normal sinus rhythm (02/04 2000) Resp:  [11-19] 17 (02/05 1000) BP: (114-170)/(74-107) 130/82 (02/05 1000) SpO2:  [92 %-100 %] 93 % (02/05 1000)  CBC:   Recent Labs Lab 04/02/16 1308  04/05/16 0328 04/06/16 0239  WBC 5.2  < > 6.8 5.7  NEUTROABS 1.9  --   --   --   HGB 12.0*  < > 10.7* 10.4*  HCT 37.2*  < > 33.8* 32.6*  MCV 78.6  < > 79.2 78.7  PLT 234  < > 216 213  < > = values in this interval not displayed.  Basic Metabolic Panel:   Recent Labs Lab 04/05/16 0328 04/06/16 0239  NA 137 136  K 3.9 3.8  CL 107 106  CO2 22 23  GLUCOSE 108* 96  BUN 11 11  CREATININE 1.29* 1.30*  CALCIUM 9.0 8.8*    Lipid Panel:     Component Value Date/Time   CHOL 142 03/25/2016 1141   TRIG 118.0 03/25/2016 1141   HDL 48.80 03/25/2016 1141   CHOLHDL 3 03/25/2016 1141   VLDL 23.6 03/25/2016 1141   LDLCALC 69 03/25/2016 1141   HgbA1c:  Lab Results  Component Value Date   HGBA1C 5.7 (H) 04/03/2016    Urine Drug Screen:     Component Value Date/Time   LABOPIA NONE DETECTED 04/03/2016 0800   COCAINSCRNUR POSITIVE (A) 04/03/2016 0800   LABBENZ NONE DETECTED 04/03/2016 0800   AMPHETMU NONE DETECTED 04/03/2016 0800   THCU NONE DETECTED 04/03/2016 0800   LABBARB NONE DETECTED 04/03/2016 0800      IMAGING  Ct Head Wo Contrast 04/02/2016 1. Acute left basal ganglial hemorrhage with mild surrounding edema. This may be hypertensive in origin. 2. Slightly more dense left middle cerebral artery proximally of questionable significance. Consider MRI if warranted. 3. Diffuse atrophy and small vessel disease. 4. Fluid within the right maxillary sinus.   CT angiogram head and neck No change in size of left basal ganglia intraparenchymal hematoma. The vessels are tortuous as might be seen with hypertension, but there is no evidence of atherosclerotic plaque, stenosis, dissection, aneurysm or vascular malformation.  2-D echocardiogram - Left ventricle: The cavity size was normal. There was mild concentric hypertrophy. Systolic function was vigorous. The estimated ejection fraction was in the range of 65% to 70%. Wall motion was normal; there were no regional wall motion abnormalities. Features are consistent with a pseudonormal left ventricular filling pattern, with concomitant abnormal relaxation and increased  filling pressure (grade 2 diastolic dysfunction). Doppler parameters are consistent with high ventricular filling pressure. - Aortic valve: Poorly visualized. Trileaflet; mildly thickened, mildly calcified leaflets. - Aorta: Aortic root dimension: 45 mm (ED). - Aortic root: The aortic root was moderately dilated. - Left atrium: The atrium was severely dilated. - Pulmonary arteries: Systolic pressure could not be accurately estimated.  [place] Scrotum [place] Art/ven Flow Abd Pelv Doppler 04/02/2016 1. Small benign-appearing bilateral intratesticular cysts. 2. Numerous bilateral epididymal cysts. 3.  Very large left-sided hydrocele.   PHYSICAL EXAM General - Well nourished, well developed, in no apparent distress.  Cardiovascular - Regular rate and rhythm. Pulmonary:  Distant breath sounds GI:  Soft , NT , ND, normal bowel sounds Extremities:  No C/C/E     Mental Status -  Level of arousal and orientation to year, age, place, and person were intact, but not orientated to month.  mild dysarthria. Follows commands well.  Cranial Nerves II - XII - II - Visual field intact OU. III, IV, VI - Extraocular movements intact. V - Facial sensation intact bilaterally. VII - right facial droop. VIII - Hearing & vestibular intact bilaterally. X - Palate elevates symmetrically, mild dysarthria. XI - Chin turning & shoulder shrug intact bilaterally. XII - Tongue protrusion intact.  Motor Strength - The patient's strength was normal in all extremities except right hand mild dexterity difficulty and pronator drift was absent.  Bulk was normal and fasciculations were absent.   Motor Tone - Muscle tone was assessed at the neck and appendages and was normal.  Sensory - Light touch, temperature/pinprick were assessed and were symmetrical.    Coordination - The patient had normal movements in the hands with no ataxia or dysmetria.   Gait and Station - not tested.   ASSESSMENT/PLAN Mr. Carlis AbbottJohn Jagielski is a 71 y.o. male with history of hypertension and chronic headaches presenting with scrotal pain and difficulty walking. Developed right hemiparesis over the past few hours. CT found a L basal ganglia hemorrhage.  Stroke:  Left basal ganglia hemorrhage secondary to hypertensive emergency and cocaine abuse  Resultant  Right facial droop and right hand dexterity difficulty  CT head L basal ganglia hemorrhage   CTA head and neck  No AVM or aneurysm, stable hematoma  2D Echo  EF 65-70%  LDL 69  HgbA1c 5.7  SCDs for VTE prophylaxis Diet regular Room service appropriate? Yes; Fluid consistency:  Thin  No antithrombotic prior to admission, now on No antithrombotic due to ICH  Ongoing aggressive stroke risk factor management  Therapy recommendations:  CIR. Consult order in place  Disposition:  Pending  Transfer to the floor  Cocaine abuse  UDS positive for cocaine  Cessation counseling to be provided  Hypertensive emergency  Blood pressure on arrival, 245/133) setting of neurologic symptom development   Initially treated with cardene drip, now off  SBP goal < 150. Increase goal to < 180  BP controlled on lisinopril and clonidine  Long-term BP goal normotensive  Tobacco abuse  Current smoker  Smoking cessation counseling provided  Pt is willing to quit  Scrotal pain  US - large left-sided hydrocele  No acute intervention  Follow up with PCP as outpt  Other Stroke Risk Factors  Advanced age  Obesity, Body mass index is 31.03 kg/m., recommend weight loss, diet and exercise as appropriate   Other Active Problems  CKD, stage I  New onset oxygen destaration  Hospital day # 4  BIBY,SHARON  [facility]  See Amion for Pager information 04/06/2016 11:27 AM  I have personally examined this patient, reviewed notes, independently viewed imaging studies, participated in medical decision making and plan of care.ROS completed by me personally and pertinent positives fully documented  I have made any additions or clarifications directly to the above note. Agree with note above. Transfer to the neurological floor. Mobilize out of bed. Therapy and rehabilitation consults. Greater than 50% time during this 35 minute visit was spent on counseling and coordination of care about his hemorrhage and plan for treatment and answering questions.  Delia Heady, MD Medical Director [facility] Pager: [phone] 04/06/2016 3:57 PM  To contact Stroke Continuity provider, please refer to [email or web address]. After hours, contact General Neurology

## 2016-04-06 NOTE — Consult Note (Signed)
Physical Medicine and Rehabilitation Consult   Reason for Consult: right sided weakness Referring Physician: Dr. Pearlean BrownieSethi   HPI: Andrew AbbottJohn Cuevas is a 71 y.o. male with history of HTN, chronic HA, prior CVA with residual deficits who presented to ED on 04/02/16 with one month history of testicular pain and was found to have right sided weakness, soft voice and mild facial weakness. History taken from chart review and patient. BP elevated at admission 245/133 and he was started on cardene drip. Left testicular pain felt to be due to large hydrocele--no intervention needed.  CT head reviewed, showing acute left basal ganglial hemorrhage with mild edema and atrophy.  CTA head and neck without stenosis, dissection, plaque or AVM. 2D echo with EF 65-70% with mild concentric LVH and grade 2 diastolic dysfunction. UDS positive for cocaine. Dr. Roda ShuttersXu felt that bleed due to HTN emergency and cocaine abuse. PT/OT evaluations done yesterday revealing difficulty with ability to follow commands, poor safety with ADL tasks and problems sequencing, balance deficits and difficulty walking due to right sided weakness. CIR recommended for follow up therapy.     Review of Systems  HENT: Negative for hearing loss and tinnitus.   Eyes: Negative for blurred vision and double vision.  Respiratory: Negative for cough and shortness of breath.   Cardiovascular: Negative for chest pain and palpitations.  Gastrointestinal: Negative for constipation and heartburn.  Genitourinary: Negative for frequency and urgency.  Musculoskeletal: Negative for back pain, joint pain and myalgias.  Neurological: Positive for tremors. Negative for sensory change and headaches.  Psychiatric/Behavioral: The patient does not have insomnia.   All other systems reviewed and are negative.     Past Medical History:  Diagnosis Date  . Chronic headaches   . CVA (cerebral vascular accident) (HCC)   . HTN (hypertension), malignant     Past  Surgical History:  Procedure Laterality Date  . HERNIA REPAIR Bilateral     Family History  Problem Relation Age of Onset  . Hypertension Mother   . Arthritis Mother   . Alzheimer's disease Father     Social History:  Single. Lives with sister. 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 Sister retired and can provide supervision? He reports that he has been smoking Cigarettes 1/2 PPD since age 71.   He has been smoking about 0.50 packs per day. He has never used smokeless tobacco. He reports that he does not drink alcohol or use drugs.    Allergies: No Known Allergies    Facility-Administered Medications Prior to Admission  Medication Dose Route Frequency Provider Last Rate Last Dose  . cloNIDine (CATAPRES) tablet 0.1 mg  0.1 mg Oral Daily Rollene RotundaJames Hochrein, MD   0.1 mg at 03/27/16 [id number]   Medications Prior to Admission  Medication Sig Dispense Refill  . acetaminophen (TYLENOL) 325 MG tablet Take 650 mg by mouth every 6 (six) hours as needed for mild pain.    . cloNIDine (CATAPRES) 0.1 MG tablet Take 1 tablet (0.1 mg total) by mouth 2 (two) times daily. 60 tablet 11  . lisinopril (PRINIVIL,ZESTRIL) 20 MG tablet Take 1 tablet (20 mg total) by mouth daily. 30 tablet 0    Home: Home Living Family/patient expects to be discharged to:: Inpatient rehab Living Arrangements: Other relatives Additional Comments: pt idnicates he lives with his sister.  Lives With: Family (sister)  Functional History: Prior Function Level of Independence: Needs assistance Gait / Transfers Assistance Needed: pt indicates he has a RW,  but that he is able to ambulate longer distances. ADL's / Homemaking Assistance Needed: pt states he performs his own ADLs, but that sister helps with homemaking tasks.   Comments: pt with difficulty answering questions, so may need to clarify PLOF with family.   Functional Status:  Mobility: Bed Mobility Overal bed mobility: Needs Assistance Bed Mobility: Supine to  Sit Supine to sit: Mod assist, HOB elevated General bed mobility comments: pt initiates coming to sitting with only verbal cue, but is unable to bring trunk upright and needs A with trunk and scooting hips closer to EOB.   Transfers Overall transfer level: Needs assistance Equipment used: Rolling walker (2 wheeled) Transfers: Sit to/from Stand Sit to Stand: Mod assist, +2 safety/equipment General transfer comment: Max cueing for safe technique.  pt tends to grab RW and use momentum to bring himself to standing and then uncontrolled descent to sitting.   Ambulation/Gait Ambulation/Gait assistance: Mod assist, +2 safety/equipment Ambulation Distance (Feet): 40 Feet Assistive device: Rolling walker (2 wheeled) Gait Pattern/deviations: Step-to pattern, Decreased step length - right, Decreased dorsiflexion - right, Drifts right/left, Trunk flexed General Gait Details: pt needs consistent ModA for balance and management of RW.  pt tends to push RW very far out in front of him and steps outside RW with turns despite max cueing and PT A with movement of RW.  2nd person present for management of lines, IV pole, and for safety.      ADL: ADL Overall ADL's : Needs assistance/impaired Eating/Feeding: Set up, Sitting Grooming: Min guard, Sitting Upper Body Bathing: Min guard, Sitting Lower Body Bathing: Minimal assistance, Sit to/from stand Upper Body Dressing : Minimal assistance, Sitting Lower Body Dressing: Minimal assistance, Sit to/from stand Lower Body Dressing Details (indicate cue type and reason): Pt able to don socks sitting EOB with min guard assist. Anticipate he would require assist in standing due to poor balance Toilet Transfer: Moderate assistance, +2 for safety/equipment, Ambulation, RW, Cueing for safety, Cueing for sequencing Toilet Transfer Details (indicate cue type and reason): Simulated by sit to stand from EOB with functional mobility Functional mobility during ADLs: Moderate  assistance, +2 for safety/equipment, Rolling walker, Cueing for safety, Cueing for sequencing  Cognition: Cognition Overall Cognitive Status: Impaired/Different from baseline Arousal/Alertness: Awake/alert Orientation Level: Oriented X4 Attention: Sustained Sustained Attention: Impaired Sustained Attention Impairment: Verbal basic Memory: Impaired Memory Impairment: Storage deficit, Retrieval deficit, Decreased short term memory Decreased Short Term Memory: Verbal basic Awareness: Impaired Awareness Impairment: Intellectual impairment Problem Solving: Appears intact Safety/Judgment: Appears intact Cognition Arousal/Alertness: Awake/alert Behavior During Therapy: Flat affect Overall Cognitive Status: Impaired/Different from baseline Area of Impairment: Orientation, Attention, Memory, Following commands, Safety/judgement, Awareness, Problem solving Orientation Level: Disoriented to (pt knows hospital in [place], not name) Current Attention Level: Selective Memory: Decreased short-term memory Following Commands: Follows one step commands with increased time, Follows multi-step commands inconsistently Safety/Judgement: Decreased awareness of safety, Decreased awareness of deficits Awareness: Intellectual Problem Solving: Slow processing, Decreased initiation, Difficulty sequencing, Requires verbal cues, Requires tactile cues General Comments: Unclear pt's baseline level of cognition, but is currently impaired.  pt mentioned living in [place], but then states he has been living with his sister for "a while".    Blood pressure 135/83, pulse 66, temperature 97.7 F (36.5 C), temperature source Oral, resp. rate 12, height 5\' 10"  (1.778 m), weight 98.1 kg (216 lb 4.3 oz), SpO2 93 %. Physical Exam  Nursing note and vitals reviewed. Constitutional: He is oriented to person, place, and time. He appears  well-developed.  Obese  HENT:  Head: Normocephalic and atraumatic.  Mouth/Throat: Oropharynx is  clear and moist.  Eyes: Conjunctivae and EOM are normal. Pupils are equal, round, and reactive to light.  Neck: Normal range of motion. Neck supple.  Cardiovascular: Normal rate and regular rhythm.   Respiratory: Effort normal and breath sounds normal. No stridor. No respiratory distress. He has no wheezes.  GI: Soft. He exhibits no distension. There is no tenderness.  Musculoskeletal: He exhibits no edema or tenderness.  Neurological: He is alert and oriented to person, place, and time. Coordination abnormal.  Flat affect with soft but clear voice.  Minimal right facial weakness.  Delayed responses.  Able to follow simple one and two step motor commands. Motor: RUE/RLE: 4+/5 proximal to distal LUE/LLE: 5/5 proximal to distal Sensation intact to light touch DTRs symmetric  Skin: Skin is warm and dry.  Psychiatric: His affect is blunt. His speech is delayed. He is slowed.    Results for orders placed or performed during the hospital encounter of 04/02/16 (from the past 24 hour(s))  CBC     Status: Abnormal   Collection Time: 04/06/16  2:39 AM  Result Value Ref Range   WBC 5.7 4.0 - 10.5 K/uL   RBC 4.14 (L) 4.22 - 5.81 MIL/uL   Hemoglobin 10.4 (L) 13.0 - 17.0 g/dL   HCT 16.1 (L) 09.6 - 04.5 %   MCV 78.7 78.0 - 100.0 fL   MCH 25.1 (L) 26.0 - 34.0 pg   MCHC 31.9 30.0 - 36.0 g/dL   RDW 40.9 (H) 81.1 - 91.4 %   Platelets 213 150 - 400 K/uL  Basic metabolic panel     Status: Abnormal   Collection Time: 04/06/16  2:39 AM  Result Value Ref Range   Sodium 136 135 - 145 mmol/L   Potassium 3.8 3.5 - 5.1 mmol/L   Chloride 106 101 - 111 mmol/L   CO2 23 22 - 32 mmol/L   Glucose, Bld 96 65 - 99 mg/dL   BUN 11 6 - 20 mg/dL   Creatinine, Ser 7.82 (H) 0.61 - 1.24 mg/dL   Calcium 8.8 (L) 8.9 - 10.3 mg/dL   GFR calc non Af Amer 53 (L) >60 mL/min   GFR calc Af Amer >60 >60 mL/min   Anion gap 7 5 - 15   Dg Chest Port 1 View  Result Date: 04/05/2016 CLINICAL DATA:  Oxygen desaturation EXAM:  PORTABLE CHEST 1 VIEW COMPARISON:  None. FINDINGS: No pneumothorax. Mild opacity in the right lung base is somewhat platelike in appearance. Cardiomegaly. The hila and mediastinum are unremarkable given portable technique. No other abnormalities. IMPRESSION: Opacity in the right lung base is somewhat platelike and may represent atelectasis. Recommend follow-up to resolution. No other acute abnormalities. Electronically Signed   By: Gerome Sam III M.D   On: 04/05/2016 10:29    Assessment/Plan: Diagnosis: Left basal ganglial hemorrhage Labs and images independently reviewed.  Records reviewed and summated above. Stroke: Continue secondary stroke prophylaxis and Risk Factor Modification listed below:   Blood Pressure Management:  Continue current medication with prn's with permisive HTN per primary team Prediabetes management:   Tobacco abuse:   Right sided hemiparesis  Does the need for close, 24 hr/day medical supervision in concert with the patient's rehab needs make it unreasonable for this patient to be served in a less intensive setting? Yes  Co-Morbidities requiring supervision/potential complications: Tobacco abuse (counsel), cocaine abuse (counsel), grade 2 diastolic dysfunction (monitor for signs/symptoms  of fluid overload), HTN (monitor and provide prns in accordance with increased physical exertion and pain), chronic HA (monitor, treat as necessary), prior CVA with residual deficits, CKD (avoid nephrotoxic meds), ABLA (transfuse if necessary to ensure appropriate perfusion for increased activity tolerance 1. Due to safety, disease management, medication administration and patient education, does the patient require 24 hr/day rehab nursing? Yes 2. Does the patient require coordinated care of a physician, rehab nurse, PT (1-2 hrs/day, 5 days/week), OT (1-2 hrs/day, 5 days/week) and SLP (1-2 hrs/day, 5 days/week) to address physical and functional deficits in the context of the above  medical diagnosis(es)? Yes Addressing deficits in the following areas: balance, endurance, locomotion, strength, transferring, bathing, dressing, toileting, cognition and psychosocial support 3. Can the patient actively participate in an intensive therapy program of at least 3 hrs of therapy per day at least 5 days per week? Yes 4. The potential for patient to make measurable gains while on inpatient rehab is excellent 5. Anticipated functional outcomes upon discharge from inpatient rehab are supervision  with PT, supervision with OT, supervision with SLP. 6. Estimated rehab length of stay to reach the above functional goals is: 12-16 days. 7. Does the patient have adequate social supports and living environment to accommodate these discharge functional goals? Potentially 8. Anticipated D/C setting: Home 9. Anticipated post D/C treatments: HH therapy and Home excercise program 10. Overall Rehab/Functional Prognosis: good  RECOMMENDATIONS: This patient's condition is appropriate for continued rehabilitative care in the following setting: CIR if caregiver support available upon discharge. Patient has agreed to participate in recommended program. Potentially Note that insurance prior authorization may be required for reimbursement for recommended care.  Comment: Rehab Admissions Coordinator to follow up.  Maryla Morrow, MD, Earlie Counts, [place] 04/06/2016

## 2016-04-07 NOTE — Progress Notes (Signed)
Occupational Therapy Treatment Patient Details Name: Andrew Cuevas MRN: [id number] DOB: 01-Dec-1945 Today's Date: 04/07/2016    History of present illness pt presents with L Basal Ganglia Infarct, HTN Emergency, with positive UDS for Cocaine.  pt had initially presented to ED with testicular pain and was found to have L Hydrocele.  pt with hx of Polysubstance, HTN, CVA, and Chronic Headaches.     OT comments  Pt progressing toward OT goals. He continues to require verbal cues for initiation and problem solving during BADL tasks. Pt able to sequence tooth brushing ADL sitting in recliner with min verbal cueing this session. Additionally, he was able to complete stand-pivot toilet transfer with mod assist this session. D/C plan remains appropriate and continue to feel that pt is an excellent CIR candidate. OT will continue to follow acutely.   Follow Up Recommendations  CIR;Supervision/Assistance - 24 hour    Equipment Recommendations  Other (comment) (TBD at next venue of care)    Recommendations for Other Services Rehab consult    Precautions / Restrictions Precautions Precautions: Fall Restrictions Weight Bearing Restrictions: No       Mobility Bed Mobility Overal bed mobility: Needs Assistance Bed Mobility: Supine to Sit     Supine to sit: Min assist     General bed mobility comments: Min assist with raising trunk from bed and  to scoot to EOB; increased time required.   Transfers Overall transfer level: Needs assistance Equipment used: Rolling walker (2 wheeled) Transfers: Sit to/from Stand Sit to Stand: Mod assist         General transfer comment: VC's for safe use of RW.    Balance Overall balance assessment: Needs assistance Sitting-balance support: No upper extremity supported;Single extremity supported;Feet supported Sitting balance-Leahy Scale: Fair     Standing balance support: Bilateral upper extremity supported;During functional activity Standing  balance-Leahy Scale: Poor                     ADL Overall ADL's : Needs assistance/impaired     Grooming: Supervision/safety;Sitting Grooming Details (indicate cue type and reason): Verbal and tactile cues for sequencing.                 Toilet Transfer: Moderate assistance;Stand-pivot;RW;BSC Toilet Transfer Details (indicate cue type and reason): Simulated from bed to recliner.  Toileting- Clothing Manipulation and Hygiene: Minimal assistance;Sit to/from stand Toileting - Clothing Manipulation Details (indicate cue type and reason): Mod assist for sit<>stand. Min assist during task to maintain balance.       General ADL Comments: Pt with difficulty problem solving that he needed to remove blankets prior to sitting at EOB. He required verbal and tactile cues for initiation and sequencing of basic ADL tasks this session.       Vision                 Additional Comments: Able to identify items on tray accurately in entire visual field. Will continue to assess.   Perception     Praxis      Cognition   Behavior During Therapy: Flat affect Overall Cognitive Status: Impaired/Different from baseline Area of Impairment: Attention;Memory;Following commands;Safety/judgement;Awareness;Problem solving   Current Attention Level: Selective Memory: Decreased short-term memory  Following Commands: Follows one step commands with increased time;Follows multi-step commands inconsistently Safety/Judgement: Decreased awareness of safety;Decreased awareness of deficits Awareness: Intellectual Problem Solving: Slow processing;Decreased initiation;Difficulty sequencing;Requires verbal cues;Requires tactile cues General Comments: Difficulty sequencing noted with tooth brushing task. Pt required verbal and tactile  cues to identify and correct errors.    Extremity/Trunk Assessment               Exercises     Shoulder Instructions       General Comments       Pertinent Vitals/ Pain       Pain Assessment: No/denies pain  Home Living                                          Prior Functioning/Environment              Frequency  Min 2X/week        Progress Toward Goals  OT Goals(current goals can now be found in the care plan section)  Progress towards OT goals: Progressing toward goals  Acute Rehab OT Goals Patient Stated Goal: Get better. OT Goal Formulation: With patient Time For Goal Achievement: 04/19/16 Potential to Achieve Goals: Good ADL Goals Pt Will Perform Grooming: with min guard assist;standing Pt Will Perform Upper Body Bathing: with supervision;sitting Pt Will Perform Lower Body Bathing: with min guard assist;sit to/from stand Pt Will Transfer to Toilet: with min guard assist;ambulating;bedside commode Pt Will Perform Toileting - Clothing Manipulation and hygiene: with min guard assist;sit to/from stand  Plan Discharge plan remains appropriate    Co-evaluation                 End of Session Equipment Utilized During Treatment: Gait belt;Rolling walker   Activity Tolerance Patient tolerated treatment well   Patient Left in chair;with call bell/phone within reach;with chair alarm set   Nurse Communication Mobility status        Time: 9528-4132 OT Time Calculation (min): 22 min  Charges: OT General Charges $OT Visit: 1 Procedure OT Treatments $Self Care/Home Management : 8-22 mins  Doristine Section, OTR/L [phone] 04/07/2016, 3:54 PM

## 2016-04-07 NOTE — PMR Pre-admission (Signed)
PMR Admission Coordinator Pre-Admission Assessment  Patient: Andrew Cuevas is an 71 y.o., male MRN: [id number] DOB: Jul 02, 1945 Height: 5' 10"  (177.8 cm) Weight: 98.1 kg (216 lb 4.3 oz)              Insurance Information HMO: Yes    PPO:       PCP:       IPA:       80/20:       OTHER:  Group # [id number] PRIMARY: UHC medicare      Policy#: [id number]      Subscriber:  [name] CM Name: [name]      Phone#: [phone]     Fax#: [phone] (Has EPIC access) Pre-Cert#: [id number]      Employer: Retired Benefits:  Phone #: [phone]     Name:  Hanley Seamen. Date:  03/02/16     Deduct:  $0      Out of Pocket Max: $6200 (met $0)      Life Max: unlimited CIR: $325 days 1-5      SNF: $0 days 1-20; $160 days 21-59; $0 days 60-100 Outpatient: Medical necessity     Co-Pay: $25/visit Home Health: 100%      Co-Pay: none DME: 90%     Co-Pay: 10% Providers:  In network  Medicaid Application Date:        Case Manager:   Disability Application Date:        Case Worker:    Emergency Contact Information Contact Information    Name Relation Home Work Mobile   Ehlert,[name] Sister [phone]     Goodloe,Gerardus Brother [phone]       Current Medical History  Patient Admitting Diagnosis:  Left BG hemorrhage  History of Present Illness: a 71 y.o.malewith history of HTN, chronic HA, prior CVA with residual deficits who presented to ED on 04/02/16 with one month history of testicular pain and was found to have right sided weakness, soft voice and mild facial weakness. History taken from chart review and patient. BP elevated at admission 245/133 and he was started on cardene drip. Left testicular pain felt to be due to very large left sided hydrocele--no intervention needed. CT head reviewed, showing acute left basal ganglial hemorrhage with mild edema and atrophy. CTA head and neck without stenosis, dissection, plaque or AVM. 2D echo with EF 65-70% with mild concentric LVH and grade 2 diastolic  dysfunction. UDS positive for cocaine. BLE dopplers negative for DVT. Dr. Erlinda Hong felt that bleed due to HTN emergency and cocaine abuse. Therapy ongoing and patient noted to have poor safety with ADL tasks, problems sequencing, balance deficits with festinating gait. CIR recommended by MD and rehab team.    Total: 1=NIH  Past Medical History  Past Medical History:  Diagnosis Date  . Chronic headaches   . CVA (cerebral vascular accident) ([place])   . HTN (hypertension), malignant     Family History  family history includes Alzheimer's disease in his father; Arthritis in his mother; Hypertension in his mother.  Prior Rehab/Hospitalizations: Had a previous CVA in 2003 while in [place].  Has the patient had major surgery during 100 days prior to admission? No  Current Medications   Current Facility-Administered Medications:  .  acetaminophen (TYLENOL) tablet 650 mg, 650 mg, Oral, Q4H PRN, 650 mg at 04/06/16 1819 **OR** acetaminophen (TYLENOL) solution 650 mg, 650 mg, Per Tube, Q4H PRN **OR** acetaminophen (TYLENOL) suppository 650 mg, 650 mg, Rectal, Q4H PRN, [name], MD .  cloNIDine (  CATAPRES) tablet 0.1 mg, 0.1 mg, Oral, BID, [name], MD, 0.1 mg at 04/07/16 1041 .  labetalol (NORMODYNE,TRANDATE) injection 10 mg, 10 mg, Intravenous, Q15 min PRN, Catha Gosselin, MD, 10 mg at 04/05/16 1830 .  lisinopril (PRINIVIL,ZESTRIL) tablet 20 mg, 20 mg, Oral, Daily, [name], MD, 20 mg at 04/07/16 1040 .  MEDLINE mouth rinse, 15 mL, Mouth Rinse, BID, [name], MD, 15 mL at 04/07/16 1106 .  pantoprazole (PROTONIX) EC tablet 40 mg, 40 mg, Oral, Daily, [name], RPH, 40 mg at 04/07/16 1040 .  senna-docusate (Senokot-S) tablet 1 tablet, 1 tablet, Oral, BID, [name], MD, 1 tablet at 04/07/16 1040  Patients Current Diet: Diet regular Room service appropriate? Yes; Fluid consistency: Thin  Precautions / Restrictions Precautions Precautions: Fall Restrictions Weight  Bearing Restrictions: No   Has the patient had 2 or more falls or a fall with injury in the past year?No  Prior Activity Level Household: Went to MD appointments and an occasional family get together about 2 X a month.  Home Assistive Devices / Equipment    Prior Device Use: Indicate devices/aids used by the patient prior to current illness, exacerbation or injury? None.  Sister reports that she tried to get patient to use a cane or a walker, but he has not been using a device at home.  Prior Functional Level Prior Function Level of Independence: Needs assistance Gait / Transfers Assistance Needed: pt indicates he has a RW, but that he is able to ambulate longer distances. ADL's / Homemaking Assistance Needed: pt states he performs his own ADLs, but that sister helps with homemaking tasks.   Comments: pt with difficulty answering questions, so may need to clarify PLOF with family.    Self Care: Did the patient need help bathing, dressing, using the toilet or eating?  Independent  Indoor Mobility: Did the patient need assistance with walking from room to room (with or without device)? Independent  Stairs: Did the patient need assistance with internal or external stairs (with or without device)? Independent  Functional Cognition: Did the patient need help planning regular tasks such as shopping or remembering to take medications? Independent  Current Functional Level Cognition  Arousal/Alertness: Awake/alert Overall Cognitive Status: Impaired/Different from baseline Current Attention Level: Selective Orientation Level: Oriented X4 Following Commands: Follows one step commands with increased time, Follows multi-step commands inconsistently Safety/Judgement: Decreased awareness of safety, Decreased awareness of deficits General Comments: Unclear pt's baseline level of cognition, but is currently impaired.  pt mentioned living in [place], but then states he has been living with his sister  for "a while".   Attention: Sustained Sustained Attention: Impaired Sustained Attention Impairment: Verbal basic Memory: Impaired Memory Impairment: Storage deficit, Retrieval deficit, Decreased short term memory Decreased Short Term Memory: Verbal basic Awareness: Impaired Awareness Impairment: Intellectual impairment Problem Solving: Appears intact Safety/Judgment: Appears intact    Extremity Assessment (includes Sensation/Coordination)  Upper Extremity Assessment: RUE deficits/detail, Generalized weakness RUE Deficits / Details: grossly 3+/5. pt does not report sensation changes  Lower Extremity Assessment: Defer to PT evaluation RLE Deficits / Details: Sensation intact, but difficulties with coordination and strength grossly 4-/5.   RLE Coordination: decreased fine motor, decreased gross motor    ADLs  Overall ADL's : Needs assistance/impaired Eating/Feeding: Set up, Sitting Grooming: Min guard, Sitting Upper Body Bathing: Min guard, Sitting Lower Body Bathing: Minimal assistance, Sit to/from stand Upper Body Dressing : Minimal assistance, Sitting Lower Body Dressing: Minimal assistance, Sit to/from stand Lower  Body Dressing Details (indicate cue type and reason): Pt able to don socks sitting EOB with min guard assist. Anticipate he would require assist in standing due to poor balance Toilet Transfer: Moderate assistance, +2 for safety/equipment, Ambulation, RW, Cueing for safety, Cueing for sequencing Toilet Transfer Details (indicate cue type and reason): Simulated by sit to stand from EOB with functional mobility Functional mobility during ADLs: Moderate assistance, +2 for safety/equipment, Rolling walker, Cueing for safety, Cueing for sequencing    Mobility  Overal bed mobility: Needs Assistance Bed Mobility: Supine to Sit Supine to sit: Min assist General bed mobility comments: A with coming to sitting and steadying him while scooting to EOB.      Transfers  Overall  transfer level: Needs assistance Equipment used: Rolling walker (2 wheeled) Transfers: Sit to/from Stand Sit to Stand: Mod assist, +2 safety/equipment General transfer comment: cues for UE use and A for balance with coming to standing.  pt tends to reach out for RW and is uncoordinated at times.      Ambulation / Gait / Stairs / Wheelchair Mobility  Ambulation/Gait Ambulation/Gait assistance: Mod assist, +2 safety/equipment Ambulation Distance (Feet): 50 Feet Assistive device: Rolling walker (2 wheeled) Gait Pattern/deviations: Step-through pattern, Decreased stance time - right, Decreased step length - left, Decreased dorsiflexion - right, Trunk flexed, Drifts right/left General Gait Details: pt with very small steps and pushes RW far out in front of him despite verbal and tactile cues.  At times pt almost appears to have a festinating step and does better when given rhythmic R and L cues for moving each foot.  2nd person present for management of O2 and lines.      Posture / Balance Dynamic Sitting Balance Sitting balance - Comments: pt needs UE support for dynamic balance.   Balance Overall balance assessment: Needs assistance Sitting-balance support: No upper extremity supported, Single extremity supported, Feet supported Sitting balance-Leahy Scale: Fair Sitting balance - Comments: pt needs UE support for dynamic balance.   Standing balance support: Bilateral upper extremity supported, During functional activity Standing balance-Leahy Scale: Poor    Special needs/care consideration BiPAP/CPAP No CPM No Continuous Drip IV No Dialysis No       Life Vest No Oxygen No Special Bed No Trach Size No Wound Vac (area) No      Skin No                           Bowel mgmt: Last BM 04/06/16  Bladder mgmt: Incontinence at times Diabetic mgmt No    Previous Home Environment Living Arrangements: Other relatives  Lives With: Family (sister) Additional Comments: pt idnicates he lives  with his sister.  Discharge Living Setting Plans for Discharge Living Setting: House, Lives with (comment) (Lives with his sister.) Type of Home at Discharge: House Discharge Home Layout: One level Discharge Home Access: Stairs to enter Entrance Stairs-Number of Steps: 3 steps up and then 3 steps from porch into home. Does the patient have any problems obtaining your medications?: No  Social/Family/Support Systems Patient Roles: Other (Comment) (Has a sister, brother, step daughter.) Contact Information: Blanch Media Ambs Anticipated Caregiver: sister Anticipated Caregiver's Contact Information: Blanch Media - sister- [phone] Ability/Limitations of Caregiver: Sister is retired and can provide supervision for patient after rehab discharge. Caregiver Availability: 24/7 Discharge Plan Discussed with Primary Caregiver: Yes Is Caregiver In Agreement with Plan?: Yes Does Caregiver/Family have Issues with Lodging/Transportation while Pt is in Rehab?: No  Goals/Additional Needs  Patient/Family Goal for Rehab: PT/OT/SLP supervision goals Expected length of stay: 12-16 days Cultural Considerations: None Dietary Needs: Regular diet, thin liquids Equipment Needs: TBD Additional Information: Patient is from [place].  He has been in [place] about 1 month with his sister.  Has wife passed away 09-16-15. Pt/Family Agrees to Admission and willing to participate: Yes Program Orientation Provided & Reviewed with Pt/Caregiver Including Roles  & Responsibilities: Yes Additional Information Needs: Sister would like to inquire about possibly getting Medicaid for patient. Information Needs to be Provided By: Social worker to assist sister with information related to applying for Medicaid.  Decrease burden of Care through IP rehab admission: N/A  Possible need for SNF placement upon discharge: Not anticipated.  Patient Condition: This patient's medical and functional status has changed since the consult dated:  04/06/16 in which the Rehabilitation Physician determined and documented that the patient's condition is appropriate for intensive rehabilitative care in an inpatient rehabilitation facility. See "History of Present Illness" (above) for medical update. Functional changes are:  Currently requiring mod assist to ambulate 50 feet RW. Note patient's sister plans to provide supervision at home after rehab discharge.  Patient's medical and functional status update has been discussed with the Rehabilitation physician and patient remains appropriate for inpatient rehabilitation. Will admit to inpatient rehab today.  Preadmission Screen Completed By:  [name], 04/07/2016 1:53 PM ______________________________________________________________________   Discussed status with Dr. Posey Pronto on 04/08/16 at 1011 and received telephone approval for admission today.  Admission Coordinator:  [name], time1011/Date02/07/18

## 2016-04-07 NOTE — Progress Notes (Signed)
Speech Language Pathology Treatment: Cognitive-Linquistic  Patient Details Name: Carlis AbbottJohn Steinruck MRN: [id number] DOB: [birth year] Today's Date: 04/07/2016 Time: [id number] SLP Time Calculation (min) (ACUTE ONLY): 16 min  Assessment / Plan / Recommendation Clinical Impression  Pt needs Mod cues for intellectual awareness of cognitive and physical impairments s/p acute CVA. He participated in structured, cognitive-linguistic tasks with Mod cues needed for sustained attention and mildly complex verbal problem solving. Mod cues also provided for functional problem solving for use of call bell to control TV. Recommend CIR level therapy for f/u.    HPI HPI: Pt is a 71 y.o.malewith PMH of chronic headaches, CVA, HTN, and cigarette smoker, who presents with right-sided weakness. Suspected that after arriving in ED, he developed right-sided mild hemiparesis but has been stable over the past few hours. CT of head showed acute left basal ganglial hemorrhage with mild surrounding edema (may be hypertensive in origin), slightly more dense left MCA proximally of questionable significance, diffuse atrophy, small vessel disease, and fluid within right maxillary sinus.      SLP Plan  Continue with current plan of care     Recommendations                   Follow up Recommendations: Inpatient Rehab Plan: Continue with current plan of care       GO                Maxcine Hamaiewonsky, Clement Deneault 04/07/2016, 4:52 PM  Maxcine HamLaura Paiewonsky, M.A. CCC-SLP [phone]

## 2016-04-07 NOTE — Progress Notes (Signed)
STROKE TEAM PROGRESS NOTE   HISTORY OF PRESENT ILLNESS (per record) Andrew Cuevas is a 71 y.o. male with an unclear last seen well given that he did not really notice the symptoms that much but does state that he has had worse difficult to walking recently. It is suspected that after arriving here, he developed right-sided mild hemiparesis but has been stable over the past few hours. A CT was done which showed a basal ganglia hemorrhage.He actually presented due to testicular pain which the ED evaluated and feels is hydrocele with no need for acute management. He was last known well is unclear. ICH Score: 0. He was admitted to the neuro ICU for further evaluation and treatment.   SUBJECTIVE (INTERVAL HISTORY) Patient lying in bed. No  complaints.  . No family at the bedside.   OBJECTIVE Temp:  [98.3 F (36.8 C)-99.8 F (37.7 C)] 98.3 F (36.8 C) (02/06 1009) Pulse Rate:  [59-84] 84 (02/06 1009) Resp:  [18-20] 20 (02/06 1009) BP: (145-163)/(75-93) 145/83 (02/06 1009) SpO2:  [94 %-99 %] 96 % (02/06 1009)  CBC:   Recent Labs Lab 04/02/16 1308  04/05/16 0328 04/06/16 0239  WBC 5.2  < > 6.8 5.7  NEUTROABS 1.9  --   --   --   HGB 12.0*  < > 10.7* 10.4*  HCT 37.2*  < > 33.8* 32.6*  MCV 78.6  < > 79.2 78.7  PLT 234  < > 216 213  < > = values in this interval not displayed.  Basic Metabolic Panel:   Recent Labs Lab 04/05/16 0328 04/06/16 0239  NA 137 136  K 3.9 3.8  CL 107 106  CO2 22 23  GLUCOSE 108* 96  BUN 11 11  CREATININE 1.29* 1.30*  CALCIUM 9.0 8.8*    Lipid Panel:     Component Value Date/Time   CHOL 142 03/25/2016 1141   TRIG 118.0 03/25/2016 1141   HDL 48.80 03/25/2016 1141   CHOLHDL 3 03/25/2016 1141   VLDL 23.6 03/25/2016 1141   LDLCALC 69 03/25/2016 1141   HgbA1c:  Lab Results  Component Value Date   HGBA1C 5.7 (H) 04/03/2016   Urine Drug Screen:     Component Value Date/Time   LABOPIA NONE DETECTED 04/03/2016 0800   COCAINSCRNUR POSITIVE (A)  04/03/2016 0800   LABBENZ NONE DETECTED 04/03/2016 0800   AMPHETMU NONE DETECTED 04/03/2016 0800   THCU NONE DETECTED 04/03/2016 0800   LABBARB NONE DETECTED 04/03/2016 0800    LE venous doppler : No evidence of deep vein thrombosis involving the right lower   extremity and left lower extremity. - No evidence of Baker&'s cyst on the right or left.  IMAGING  Ct Head Wo Contrast 04/02/2016 1. Acute left basal ganglial hemorrhage with mild surrounding edema. This may be hypertensive in origin. 2. Slightly more dense left middle cerebral artery proximally of questionable significance. Consider MRI if warranted. 3. Diffuse atrophy and small vessel disease. 4. Fluid within the right maxillary sinus.   CT angiogram head and neck No change in size of left basal ganglia intraparenchymal hematoma. The vessels are tortuous as might be seen with hypertension, but there is no evidence of atherosclerotic plaque, stenosis, dissection, aneurysm or vascular malformation.  2-D echocardiogram - Left ventricle: The cavity size was normal. There was mild concentric hypertrophy. Systolic function was vigorous. The estimated ejection fraction was in the range of 65% to 70%. Wall motion was normal; there were no regional wall motion abnormalities. Features are  consistent with a pseudonormal left ventricular filling pattern, with concomitant abnormal relaxation and increased filling pressure (grade 2 diastolic dysfunction). Doppler parameters are consistent with high ventricular filling pressure. - Aortic valve: Poorly visualized. Trileaflet; mildly thickened, mildly calcified leaflets. - Aorta: Aortic root dimension: 45 mm (ED). - Aortic root: The aortic root was moderately dilated. - Left atrium: The atrium was severely dilated. - Pulmonary arteries: Systolic pressure could not be accurately estimated.  Kore[place] Scrotum Koreas Art/ven Flow Abd Pelv Doppler 04/02/2016 1. Small benign-appearing bilateral intratesticular  cysts. 2. Numerous bilateral epididymal cysts. 3. Very large left-sided hydrocele.   PHYSICAL EXAM General - Well nourished, well developed, in no apparent distress.  Cardiovascular - Regular rate and rhythm. Pulmonary:  Distant breath sounds GI:  Soft , NT , ND, normal bowel sounds Extremities:  No C/C/E     Mental Status -  Level of arousal and orientation to year, age, place, and person were intact, but not orientated to month.  mild dysarthria. Follows commands well.  Cranial Nerves II - XII - II - Visual field intact OU. III, IV, VI - Extraocular movements intact. V - Facial sensation intact bilaterally. VII - right facial droop. VIII - Hearing & vestibular intact bilaterally. X - Palate elevates symmetrically, mild dysarthria. XI - Chin turning & shoulder shrug intact bilaterally. XII - Tongue protrusion intact.  Motor Strength - The patient's strength was normal in all extremities except right hand mild dexterity difficulty and pronator drift was absent.  Bulk was normal and fasciculations were absent.   Motor Tone - Muscle tone was assessed at the neck and appendages and was normal.  Sensory - Light touch, temperature/pinprick were assessed and were symmetrical.    Coordination - The patient had normal movements in the hands with no ataxia or dysmetria.   Gait and Station - not tested.   ASSESSMENT/PLAN [name] is a 71 y.o. male with history of hypertension and chronic headaches presenting with scrotal pain and difficulty walking. Developed right hemiparesis over the past few hours. CT found a L basal ganglia hemorrhage.  Stroke:  Left basal ganglia hemorrhage secondary to hypertensive emergency and cocaine abuse  Resultant  Right facial droop and right hand dexterity difficulty  CT head L basal ganglia hemorrhage   CTA head and neck  No AVM or aneurysm, stable hematoma  2D Echo  EF 65-70%  LDL 69  HgbA1c 5.7  SCDs for VTE prophylaxis Diet regular  Room service appropriate? Yes; Fluid consistency: Thin  No antithrombotic prior to admission, now on No antithrombotic due to ICH  Ongoing aggressive stroke risk factor management  Therapy recommendations:  CIR. Consult order in place  Disposition:  CLR  Cocaine abuse  UDS positive for cocaine  Cessation counseling to be provided  Hypertensive emergency  Blood pressure on arrival, 245/133) setting of neurologic symptom development   Initially treated with cardene drip, now off  SBP goal < 150. Increase goal to < 180  BP controlled on lisinopril and clonidine  Long-term BP goal normotensive  Tobacco abuse  Current smoker  Smoking cessation counseling provided  Pt is willing to quit  Scrotal pain  US - large left-sided hydrocele  No acute intervention  Follow up with PCP as outpt  Other Stroke Risk Factors  Advanced age  Obesity, Body mass index is 31.03 kg/m., recommend weight loss, diet and exercise as appropriate   Other Active Problems  CKD, stage I  New onset oxygen destaration  Hospital day #  5    I have personally examined this patient, reviewed notes, independently viewed imaging studies, participated in medical decision making and plan of care.ROS completed by me personally and pertinent positives fully documented  I have made any additions or clarifications directly to the above note.  . Transfer to the neurological floor. Mobilize out of bed. Therapy and rehabilitation transfer when bed available. Stroke team will sign off. Call for questions. F/U as outpatient in stroke clinic in 6 weeks.   Delia Heady, MD Medical Director [facility] Pager: [phone] 04/07/2016 1:05 PM  To contact Stroke Continuity provider, please refer to [email or web address]. After hours, contact General Neurology

## 2016-04-07 NOTE — Care Management Important Message (Signed)
Important Message  Patient Details  Name: Andrew AbbottJohn Cuevas MRN: [id number] Date of Birth: [birth year]   Medicare Important Message Given:  Yes    [name] 04/07/2016, 1:07 PM

## 2016-04-07 NOTE — Progress Notes (Signed)
Rehab admissions - I met with patient.  He continues to tell me that he wants to go home.  I spoke with his sister who he lives with.  Sister says patient needs rehab first and then can return to her home.  I do have approval for inpatient rehab admission.  I explained copays to patient and his sister.  Sister feels rehab here at [facility] would be best.  Sister was asking questions about patient applying for Medicaid.  Currently rehab beds are full.  I hope to have a rehab bed available tomorrow.  In my absence tomorrow, my partner, Gerlean Ren, will be covering and can be reached at [phone]

## 2016-04-07 NOTE — Progress Notes (Signed)
Rehab admissions - I met with patient yesterday and then called his sister.  Sister and family wanted to talk about rehab venues and then decide best for patient.  I have called and opened the case with [facility] medicare requesting acute inpatient rehab admission.  I will follow up with patient and his sister again today.  Call me for questions.  #[phone]

## 2016-04-08 ENCOUNTER — Inpatient Hospital Stay (HOSPITAL_COMMUNITY)
Admission: RE | Admit: 2016-04-08 | Discharge: 2016-04-18 | DRG: 057 | Disposition: A | Discharge status: Home Health | Payer: Medicare Other | Source: Intra-hospital | Attending: Physical Medicine & Rehabilitation | Admitting: Physical Medicine & Rehabilitation

## 2016-04-08 DIAGNOSIS — F17210 Nicotine dependence, cigarettes, uncomplicated: Secondary | ICD-10-CM | POA: Diagnosis not present

## 2016-04-08 DIAGNOSIS — N183 Chronic kidney disease, stage 3 unspecified: Secondary | ICD-10-CM

## 2016-04-08 DIAGNOSIS — I69192 Facial weakness following nontraumatic intracerebral hemorrhage: Secondary | ICD-10-CM

## 2016-04-08 DIAGNOSIS — I69122 Dysarthria following nontraumatic intracerebral hemorrhage: Secondary | ICD-10-CM | POA: Diagnosis not present

## 2016-04-08 DIAGNOSIS — G8191 Hemiplegia, unspecified affecting right dominant side: Secondary | ICD-10-CM

## 2016-04-08 DIAGNOSIS — F329 Major depressive disorder, single episode, unspecified: Secondary | ICD-10-CM

## 2016-04-08 DIAGNOSIS — N433 Hydrocele, unspecified: Secondary | ICD-10-CM

## 2016-04-08 DIAGNOSIS — D62 Acute posthemorrhagic anemia: Secondary | ICD-10-CM | POA: Diagnosis not present

## 2016-04-08 DIAGNOSIS — F1410 Cocaine abuse, uncomplicated: Secondary | ICD-10-CM

## 2016-04-08 DIAGNOSIS — E538 Deficiency of other specified B group vitamins: Secondary | ICD-10-CM | POA: Diagnosis not present

## 2016-04-08 DIAGNOSIS — I69151 Hemiplegia and hemiparesis following nontraumatic intracerebral hemorrhage affecting right dominant side: Secondary | ICD-10-CM | POA: Diagnosis present

## 2016-04-08 DIAGNOSIS — I619 Nontraumatic intracerebral hemorrhage, unspecified: Secondary | ICD-10-CM | POA: Diagnosis not present

## 2016-04-08 DIAGNOSIS — E669 Obesity, unspecified: Secondary | ICD-10-CM | POA: Diagnosis not present

## 2016-04-08 DIAGNOSIS — R05 Cough: Secondary | ICD-10-CM | POA: Diagnosis not present

## 2016-04-08 DIAGNOSIS — N182 Chronic kidney disease, stage 2 (mild): Secondary | ICD-10-CM | POA: Diagnosis not present

## 2016-04-08 DIAGNOSIS — Z6831 Body mass index (BMI) 31.0-31.9, adult: Secondary | ICD-10-CM | POA: Diagnosis not present

## 2016-04-08 DIAGNOSIS — F331 Major depressive disorder, recurrent, moderate: Secondary | ICD-10-CM | POA: Diagnosis not present

## 2016-04-08 DIAGNOSIS — Z79899 Other long term (current) drug therapy: Secondary | ICD-10-CM

## 2016-04-08 DIAGNOSIS — J9811 Atelectasis: Secondary | ICD-10-CM

## 2016-04-08 DIAGNOSIS — I10 Essential (primary) hypertension: Secondary | ICD-10-CM | POA: Diagnosis not present

## 2016-04-08 DIAGNOSIS — R938 Abnormal findings on diagnostic imaging of other specified body structures: Secondary | ICD-10-CM | POA: Diagnosis not present

## 2016-04-08 DIAGNOSIS — I69393 Ataxia following cerebral infarction: Secondary | ICD-10-CM | POA: Diagnosis not present

## 2016-04-08 DIAGNOSIS — D638 Anemia in other chronic diseases classified elsewhere: Secondary | ICD-10-CM | POA: Diagnosis not present

## 2016-04-08 DIAGNOSIS — R059 Cough, unspecified: Secondary | ICD-10-CM

## 2016-04-08 DIAGNOSIS — D519 Vitamin B12 deficiency anemia, unspecified: Secondary | ICD-10-CM | POA: Diagnosis not present

## 2016-04-08 DIAGNOSIS — I129 Hypertensive chronic kidney disease with stage 1 through stage 4 chronic kidney disease, or unspecified chronic kidney disease: Secondary | ICD-10-CM

## 2016-04-08 DIAGNOSIS — Z8673 Personal history of transient ischemic attack (TIA), and cerebral infarction without residual deficits: Secondary | ICD-10-CM | POA: Diagnosis not present

## 2016-04-08 DIAGNOSIS — R9389 Abnormal findings on diagnostic imaging of other specified body structures: Secondary | ICD-10-CM

## 2016-04-08 LAB — CREATININE, SERUM
Creatinine, Ser: 1.33 mg/dL — ABNORMAL HIGH (ref 0.61–1.24)
GFR, EST NON AFRICAN AMERICAN: 52 mL/min — AB (ref >60)

## 2016-04-08 MED ORDER — ENOXAPARIN SODIUM 40 MG/0.4ML ~~LOC~~ SOLN
40.0000 mg | SUBCUTANEOUS | Status: DC
  Administered 2016-04-08 – 2016-04-17 (×10): 40 mg via SUBCUTANEOUS
  Filled 2016-04-08 (×10): qty 0.4

## 2016-04-08 MED ORDER — CLONIDINE HCL 0.1 MG PO TABS
0.1000 mg | ORAL_TABLET | Freq: Two times a day (BID) | ORAL | Status: DC
  Administered 2016-04-08 – 2016-04-18 (×20): 0.1 mg via ORAL
  Filled 2016-04-08 (×21): qty 1

## 2016-04-08 MED ORDER — PROCHLORPERAZINE MALEATE 5 MG PO TABS: 5.0000 mg | ORAL_TABLET | Freq: Four times a day (QID) | ORAL | Status: DC | PRN

## 2016-04-08 MED ORDER — BISACODYL 10 MG RE SUPP: 10.0000 mg | Freq: Every day | RECTAL | Status: DC | PRN

## 2016-04-08 MED ORDER — SENNOSIDES-DOCUSATE SODIUM 8.6-50 MG PO TABS
1.0000 | ORAL_TABLET | Freq: Two times a day (BID) | ORAL | Status: DC
  Administered 2016-04-08 – 2016-04-18 (×20): 1 via ORAL
  Filled 2016-04-08 (×20): qty 1

## 2016-04-08 MED ORDER — FLEET ENEMA 7-19 GM/118ML RE ENEM: 1.0000 | ENEMA | Freq: Once | RECTAL | Status: DC | PRN

## 2016-04-08 MED ORDER — ALUM & MAG HYDROXIDE-SIMETH 200-200-20 MG/5ML PO SUSP: 30.0000 mL | ORAL | Status: DC | PRN

## 2016-04-08 MED ORDER — PANTOPRAZOLE SODIUM 40 MG PO TBEC
40.0000 mg | DELAYED_RELEASE_TABLET | Freq: Every day | ORAL | Status: DC
  Administered 2016-04-09 – 2016-04-18 (×10): 40 mg via ORAL
  Filled 2016-04-08: qty 1
  Filled 2016-04-08: qty 2
  Filled 2016-04-08 (×8): qty 1

## 2016-04-08 MED ORDER — PROCHLORPERAZINE 25 MG RE SUPP: 12.5000 mg | Freq: Four times a day (QID) | RECTAL | Status: DC | PRN

## 2016-04-08 MED ORDER — DIPHENHYDRAMINE HCL 12.5 MG/5ML PO ELIX: 12.5000 mg | ORAL_SOLUTION | Freq: Four times a day (QID) | ORAL | Status: DC | PRN

## 2016-04-08 MED ORDER — GUAIFENESIN-DM 100-10 MG/5ML PO SYRP: 5.0000 mL | ORAL_SOLUTION | Freq: Four times a day (QID) | ORAL | Status: DC | PRN

## 2016-04-08 MED ORDER — LISINOPRIL 20 MG PO TABS
20.0000 mg | ORAL_TABLET | Freq: Every day | ORAL | Status: DC
  Administered 2016-04-09 – 2016-04-10 (×2): 20 mg via ORAL
  Filled 2016-04-08 (×2): qty 1

## 2016-04-08 MED ORDER — PROCHLORPERAZINE EDISYLATE 5 MG/ML IJ SOLN: 5.0000 mg | Freq: Four times a day (QID) | INTRAMUSCULAR | Status: DC | PRN

## 2016-04-08 MED ORDER — ACETAMINOPHEN 325 MG PO TABS: 325.0000 mg | ORAL_TABLET | ORAL | Status: DC | PRN

## 2016-04-08 MED ORDER — POLYETHYLENE GLYCOL 3350 17 G PO PACK: 17.0000 g | PACK | Freq: Every day | ORAL | Status: DC | PRN

## 2016-04-08 MED ORDER — TRAZODONE HCL 50 MG PO TABS: 25.0000 mg | ORAL_TABLET | Freq: Every evening | ORAL | Status: DC | PRN

## 2016-04-08 MED ORDER — CLONIDINE HCL 0.1 MG PO TABS: 0.1000 mg | ORAL_TABLET | ORAL | Status: DC | PRN

## 2016-04-08 NOTE — Progress Notes (Signed)
PT Cancellation Note  Patient Details Name: Andrew AbbottJohn Cuevas MRN: [id number] DOB: 10-Jan-1946   Cancelled Treatment:    Reason Eval/Treat Not Completed: Patient at procedure or test/unavailable. Pt eating lunch up in recliner.  Noted pt going to CIR today.  Will check back as able.   Greta Yung LUBECK 04/08/2016, 1:11 PM

## 2016-04-08 NOTE — Progress Notes (Signed)
Report given to Doree FudgeLuz, who is receiving patient in 4w

## 2016-04-08 NOTE — Progress Notes (Signed)
[name], RN Rehab Admission Coordinator Signed Physical Medicine and Rehabilitation  PMR Pre-admission Date of Service: 04/07/2016 1:48 PM  Related encounter: ED to Hosp-Admission (Current) from 04/02/2016 in [place]       _0 Hide copied text PMR Admission Coordinator Pre-Admission Assessment  Patient: Andrew Cuevas is an 71 y.o., male MRN: [id number] DOB: 1945/07/10 Height: _1  (177.8 cm) Weight: 98.1 kg (216 lb 4.3 oz)                                                                                                                                                  Insurance Information HMO: Yes    PPO:       PCP:       IPA:       80/20:       OTHER:  Group # [id number] PRIMARY: [place]      Policy#: [id number]      Subscriber:  Andrew Cuevas CM Name: [name]      Phone#: [phone]     Fax#: [phone] (Has EPIC access) Pre-Cert#: [id number]      Employer: Retired Benefits:  Phone #: [phone]     Name:  Hanley Seamen. Date:  03/02/16     Deduct:  $0      Out of Pocket Max: $6200 (met $0)      Life Max: unlimited CIR: $325 days 1-5      SNF: $0 days 1-20; $160 days 21-59; $0 days 60-100 Outpatient: Medical necessity     Co-Pay: $25/visit Home Health: 100%      Co-Pay: none DME: 90%     Co-Pay: 10% Providers:  In network  Medicaid Application Date:        Case Manager:   Disability Application Date:        Case Worker:    Emergency Contact Information        Contact Information    Name Relation Home Work Mobile   Corales,[name] Sister [phone]     Schweigert,Gerardus Brother [phone]       Current Medical History  Patient Admitting Diagnosis:  Left BG hemorrhage  History of Present Illness: a 71 y.o.malewith history of HTN, chronic HA, prior CVA with residual deficits who presented to ED on 04/02/16 with one month history of testicular pain and was found to have right sided weakness, soft voice and mild facial  weakness. History taken from chart review and patient. BP elevated at admission 245/133 and he was started on cardene drip. Left testicular pain felt to be due to verylarge left sided hydrocele--no intervention needed. CT head reviewed, showing acute left basal ganglial hemorrhage with mild edema and atrophy. CTA head and neck without stenosis, dissection, plaque or AVM. 2D echo with EF 65-70% with mild concentric LVH and grade 2 diastolic dysfunction. UDS  positive for cocaine. BLE dopplers negative for DVT. Dr. Erlinda Hong felt that bleed due to HTN emergency and cocaine abuse. Therapy ongoing and patient noted to have poor safety with ADL tasks,problems sequencing, balance deficits with festinating gait. CIR recommended by MD and rehab team.    Total: 1=NIH  Past Medical History      Past Medical History:  Diagnosis Date  . Chronic headaches   . CVA (cerebral vascular accident) ([facility])   . HTN (hypertension), malignant     Family History  family history includes Alzheimer's disease in his father; Arthritis in his mother; Hypertension in his mother.  Prior Rehab/Hospitalizations: Had a previous CVA in 2003 while in [place].  Has the patient had major surgery during 100 days prior to admission? No  Current Medications   Current Facility-Administered Medications:  .  acetaminophen (TYLENOL) tablet 650 mg, 650 mg, Oral, Q4H PRN, 650 mg at 04/06/16 1819 **OR** acetaminophen (TYLENOL) solution 650 mg, 650 mg, Per Tube, Q4H PRN **OR** acetaminophen (TYLENOL) suppository 650 mg, 650 mg, Rectal, Q4H PRN, Andrew Doom, MD .  cloNIDine (CATAPRES) tablet 0.1 mg, 0.1 mg, Oral, BID, [name], MD, 0.1 mg at 04/07/16 1041 .  labetalol (NORMODYNE,TRANDATE) injection 10 mg, 10 mg, Intravenous, Q15 min PRN, Catha Gosselin, MD, 10 mg at 04/05/16 1830 .  lisinopril (PRINIVIL,ZESTRIL) tablet 20 mg, 20 mg, Oral, Daily, [name], MD, 20 mg at 04/07/16 1040 .  MEDLINE mouth rinse, 15 mL, Mouth  Rinse, BID, [name], MD, 15 mL at 04/07/16 1106 .  pantoprazole (PROTONIX) EC tablet 40 mg, 40 mg, Oral, Daily, [name], RPH, 40 mg at 04/07/16 1040 .  senna-docusate (Senokot-S) tablet 1 tablet, 1 tablet, Oral, BID, Andrew Doom, MD, 1 tablet at 04/07/16 1040  Patients Current Diet: Diet regular Room service appropriate? Yes; Fluid consistency: Thin  Precautions / Restrictions Precautions Precautions: Fall Restrictions Weight Bearing Restrictions: No   Has the patient had 2 or more falls or a fall with injury in the past year?No  Prior Activity Level Household: Went to MD appointments and an occasional family get together about 2 X a month.  Home Assistive Devices / Equipment  Prior Device Use: Indicate devices/aids used by the patient prior to current illness, exacerbation or injury? None.  Sister reports that she tried to get patient to use a cane or a walker, but he has not been using a device at home.  Prior Functional Level Prior Function Level of Independence: Needs assistance Gait / Transfers Assistance Needed: pt indicates he has a RW, but that he is able to ambulate longer distances. ADL's / Homemaking Assistance Needed: pt states he performs his own ADLs, but that sister helps with homemaking tasks.   Comments: pt with difficulty answering questions, so may need to clarify PLOF with family.    Self Care: Did the patient need help bathing, dressing, using the toilet or eating?  Independent  Indoor Mobility: Did the patient need assistance with walking from room to room (with or without device)? Independent  Stairs: Did the patient need assistance with internal or external stairs (with or without device)? Independent  Functional Cognition: Did the patient need help planning regular tasks such as shopping or remembering to take medications? Independent  Current Functional Level Cognition  Arousal/Alertness: Awake/alert Overall  Cognitive Status: Impaired/Different from baseline Current Attention Level: Selective Orientation Level: Oriented X4 Following Commands: Follows one step commands with increased time, Follows multi-step commands inconsistently Safety/Judgement: Decreased awareness of  safety, Decreased awareness of deficits General Comments: Unclear pt's baseline level of cognition, but is currently impaired.  pt mentioned living in [place], but then states he has been living with his sister for "a while".   Attention: Sustained Sustained Attention: Impaired Sustained Attention Impairment: Verbal basic Memory: Impaired Memory Impairment: Storage deficit, Retrieval deficit, Decreased short term memory Decreased Short Term Memory: Verbal basic Awareness: Impaired Awareness Impairment: Intellectual impairment Problem Solving: Appears intact Safety/Judgment: Appears intact    Extremity Assessment (includes Sensation/Coordination)  Upper Extremity Assessment: RUE deficits/detail, Generalized weakness RUE Deficits / Details: grossly 3+/5. pt does not report sensation changes  Lower Extremity Assessment: Defer to PT evaluation RLE Deficits / Details: Sensation intact, but difficulties with coordination and strength grossly 4-/5.   RLE Coordination: decreased fine motor, decreased gross motor    ADLs  Overall ADL's : Needs assistance/impaired Eating/Feeding: Set up, Sitting Grooming: Min guard, Sitting Upper Body Bathing: Min guard, Sitting Lower Body Bathing: Minimal assistance, Sit to/from stand Upper Body Dressing : Minimal assistance, Sitting Lower Body Dressing: Minimal assistance, Sit to/from stand Lower Body Dressing Details (indicate cue type and reason): Pt able to don socks sitting EOB with min guard assist. Anticipate he would require assist in standing due to poor balance Toilet Transfer: Moderate assistance, +2 for safety/equipment, Ambulation, RW, Cueing for safety, Cueing for sequencing Toilet  Transfer Details (indicate cue type and reason): Simulated by sit to stand from EOB with functional mobility Functional mobility during ADLs: Moderate assistance, +2 for safety/equipment, Rolling walker, Cueing for safety, Cueing for sequencing    Mobility  Overal bed mobility: Needs Assistance Bed Mobility: Supine to Sit Supine to sit: Min assist General bed mobility comments: A with coming to sitting and steadying him while scooting to EOB.      Transfers  Overall transfer level: Needs assistance Equipment used: Rolling walker (2 wheeled) Transfers: Sit to/from Stand Sit to Stand: Mod assist, +2 safety/equipment General transfer comment: cues for UE use and A for balance with coming to standing.  pt tends to reach out for RW and is uncoordinated at times.      Ambulation / Gait / Stairs / Wheelchair Mobility  Ambulation/Gait Ambulation/Gait assistance: Mod assist, +2 safety/equipment Ambulation Distance (Feet): 50 Feet Assistive device: Rolling walker (2 wheeled) Gait Pattern/deviations: Step-through pattern, Decreased stance time - right, Decreased step length - left, Decreased dorsiflexion - right, Trunk flexed, Drifts right/left General Gait Details: pt with very small steps and pushes RW far out in front of him despite verbal and tactile cues.  At times pt almost appears to have a festinating step and does better when given rhythmic R and L cues for moving each foot.  2nd person present for management of O2 and lines.      Posture / Balance Dynamic Sitting Balance Sitting balance - Comments: pt needs UE support for dynamic balance.   Balance Overall balance assessment: Needs assistance Sitting-balance support: No upper extremity supported, Single extremity supported, Feet supported Sitting balance-Leahy Scale: Fair Sitting balance - Comments: pt needs UE support for dynamic balance.   Standing balance support: Bilateral upper extremity supported, During functional  activity Standing balance-Leahy Scale: Poor    Special needs/care consideration BiPAP/CPAP No CPM No Continuous Drip IV No Dialysis No       Life Vest No Oxygen No Special Bed No Trach Size No Wound Vac (area) No      Skin No  Bowel mgmt: Last BM 04/06/16  Bladder mgmt: Incontinence at times Diabetic mgmt No    Previous Home Environment Living Arrangements: Other relatives  Lives With: Family (sister) Additional Comments: pt idnicates he lives with his sister.  Discharge Living Setting Plans for Discharge Living Setting: House, Lives with (comment) (Lives with his sister.) Type of Home at Discharge: House Discharge Home Layout: One level Discharge Home Access: Stairs to enter Entrance Stairs-Number of Steps: 3 steps up and then 3 steps from porch into home. Does the patient have any problems obtaining your medications?: No  Social/Family/Support Systems Patient Roles: Other (Comment) (Has a sister, brother, step daughter.) Contact Information: Blanch Media [place] Anticipated Caregiver: sister Anticipated Caregiver's Contact Information: Blanch Media - sister- [phone] Ability/Limitations of Caregiver: Sister is retired and can provide supervision for patient after rehab discharge. Caregiver Availability: 24/7 Discharge Plan Discussed with Primary Caregiver: Yes Is Caregiver In Agreement with Plan?: Yes Does Caregiver/Family have Issues with Lodging/Transportation while Pt is in Rehab?: No  Goals/Additional Needs Patient/Family Goal for Rehab: PT/OT/SLP supervision goals Expected length of stay: 12-16 days Cultural Considerations: None Dietary Needs: Regular diet, thin liquids Equipment Needs: TBD Additional Information: Patient is from [place].  He has been in [place] about 1 month with his sister.  Has wife passed away September 17, 2015. Pt/Family Agrees to Admission and willing to participate: Yes Program Orientation Provided & Reviewed with Pt/Caregiver  Including Roles  & Responsibilities: Yes Additional Information Needs: Sister would like to inquire about possibly getting Medicaid for patient. Information Needs to be Provided By: Social worker to assist sister with information related to applying for Medicaid.  Decrease burden of Care through IP rehab admission: N/A  Possible need for SNF placement upon discharge: Not anticipated.  Patient Condition: This patient's medical and functional status has changed since the consult dated: 04/06/16 in which the Rehabilitation Physician determined and documented that the patient's condition is appropriate for intensive rehabilitative care in an inpatient rehabilitation facility. See "History of Present Illness" (above) for medical update. Functional changes are:  Currently requiring mod assist to ambulate 50 feet RW. Note patient's sister plans to provide supervision at home after rehab discharge.  Patient's medical and functional status update has been discussed with the Rehabilitation physician and patient remains appropriate for inpatient rehabilitation. Will admit to inpatient rehab today.  Preadmission Screen Completed By:  [name], 04/07/2016 1:53 PM ______________________________________________________________________   Discussed status with Dr. Posey Pronto on 04/08/16 at 1011 and received telephone approval for admission today.  Admission Coordinator:  [name], time1011/Date02/07/18       Cosigned

## 2016-04-08 NOTE — Progress Notes (Signed)
Received pt. As transfer from 5 M.Pt. Has been oriented to the unit.Safety plan was explain,fall prevention plan was explained and signed.

## 2016-04-08 NOTE — Care Management Note (Signed)
Case Management Note  Patient Details  Name: Andrew Cuevas MRN: [id number] Date of Birth: 27-Feb-1946  Subjective/Objective:                    Action/Plan: Pt discharging to CIR today. No further needs per CM.   Expected Discharge Date:  04/08/16               Expected Discharge Plan:  IP Rehab Facility  In-House Referral:     Discharge planning Services  CM Consult  Post Acute Care Choice:    Choice offered to:     DME Arranged:    DME Agency:     HH Arranged:    HH Agency:     Status of Service:  Completed, signed off  If discussed at [employer] Meetings, dates discussed:    Additional Comments:  Kermit BaloKelli F Caide Campi, RN 04/08/2016, 12:17 PM

## 2016-04-08 NOTE — Plan of Care (Signed)
Problem: RH PAIN MANAGEMENT Goal: RH STG PAIN MANAGED AT OR BELOW PT'S PAIN GOAL Less than 3,on 1 to 10 scale.  Outcome: Progressing No pain at this time.

## 2016-04-08 NOTE — Progress Notes (Signed)
Rehab admissions - I met with patient this am.  He is now agreeable to inpatient rehab admission.  Bed available and will admit to acute inpatient rehab today.  Call me for questions.  [phone]

## 2016-04-08 NOTE — Progress Notes (Signed)
Pt BP is now below 150 SBP limit for labetalol will continue to monitor.

## 2016-04-08 NOTE — Progress Notes (Signed)
STROKE TEAM PROGRESS NOTE   HISTORY OF PRESENT ILLNESS (per record) Andrew Cuevas is a 71 y.o. male with an unclear last seen well given that he did not really notice the symptoms that much but does state that he has had worse difficult to walking recently. It is suspected that after arriving here, he developed right-sided mild hemiparesis but has been stable over the past few hours. A CT was done which showed a basal ganglia hemorrhage.He actually presented due to testicular pain which the ED evaluated and feels is hydrocele with no need for acute management. He was last known well is unclear. ICH Score: 0. He was admitted to the neuro ICU for further evaluation and treatment.   SUBJECTIVE (INTERVAL HISTORY) Patient lying in bed. No  complaints.  . No family at the bedside.medically stable for transfer to inpatient rehab   OBJECTIVE Temp:  [98 F (36.7 C)-98.8 F (37.1 C)] 98.7 F (37.1 C) (02/07 1555) Pulse Rate:  [56-74] 56 (02/07 1555) Resp:  [15-18] 16 (02/07 1555) BP: (134-160)/(70-92) 141/87 (02/07 1555) SpO2:  [92 %-100 %] 100 % (02/07 1555) Weight:  [221 lb 12.8 oz (100.6 kg)] 221 lb 12.8 oz (100.6 kg) (02/07 1555)  CBC:   Recent Labs Lab 04/02/16 1308  04/05/16 0328 04/06/16 0239  WBC 5.2  < > 6.8 5.7  NEUTROABS 1.9  --   --   --   HGB 12.0*  < > 10.7* 10.4*  HCT 37.2*  < > 33.8* 32.6*  MCV 78.6  < > 79.2 78.7  PLT 234  < > 216 213  < > = values in this interval not displayed.  Basic Metabolic Panel:   Recent Labs Lab 04/05/16 0328 04/06/16 0239 04/08/16 1556  NA 137 136  --   K 3.9 3.8  --   CL 107 106  --   CO2 22 23  --   GLUCOSE 108* 96  --   BUN 11 11  --   CREATININE 1.29* 1.30* 1.33*  CALCIUM 9.0 8.8*  --     Lipid Panel:     Component Value Date/Time   CHOL 142 03/25/2016 1141   TRIG 118.0 03/25/2016 1141   HDL 48.80 03/25/2016 1141   CHOLHDL 3 03/25/2016 1141   VLDL 23.6 03/25/2016 1141   LDLCALC 69 03/25/2016 1141   HgbA1c:  Lab  Results  Component Value Date   HGBA1C 5.7 (H) 04/03/2016   Urine Drug Screen:     Component Value Date/Time   LABOPIA NONE DETECTED 04/03/2016 0800   COCAINSCRNUR POSITIVE (A) 04/03/2016 0800   LABBENZ NONE DETECTED 04/03/2016 0800   AMPHETMU NONE DETECTED 04/03/2016 0800   THCU NONE DETECTED 04/03/2016 0800   LABBARB NONE DETECTED 04/03/2016 0800    LE venous doppler : No evidence of deep vein thrombosis involving the right lower   extremity and left lower extremity. - No evidence of Baker&'s cyst on the right or left.  IMAGING  Ct Head Wo Contrast 04/02/2016 1. Acute left basal ganglial hemorrhage with mild surrounding edema. This may be hypertensive in origin. 2. Slightly more dense left middle cerebral artery proximally of questionable significance. Consider MRI if warranted. 3. Diffuse atrophy and small vessel disease. 4. Fluid within the right maxillary sinus.   CT angiogram head and neck No change in size of left basal ganglia intraparenchymal hematoma. The vessels are tortuous as might be seen with hypertension, but there is no evidence of atherosclerotic plaque, stenosis, dissection, aneurysm or vascular  malformation.  2-D echocardiogram - Left ventricle: The cavity size was normal. There was mild concentric hypertrophy. Systolic function was vigorous. The estimated ejection fraction was in the range of 65% to 70%. Wall motion was normal; there were no regional wall motion abnormalities. Features are consistent with a pseudonormal left ventricular filling pattern, with concomitant abnormal relaxation and increased filling pressure (grade 2 diastolic dysfunction). Doppler parameters are consistent with high ventricular filling pressure. - Aortic valve: Poorly visualized. Trileaflet; mildly thickened, mildly calcified leaflets. - Aorta: Aortic root dimension: 45 mm (ED). - Aortic root: The aortic root was moderately dilated. - Left atrium: The atrium was severely dilated. -  Pulmonary arteries: Systolic pressure could not be accurately estimated.  US Scrotum US Art/ven Flow Abd Pelv Doppler 04/02/2016 1. Small benign-appearing bilateral intratesticular cysts. 2. Numerous bilateral epididymal cysts. 3. Very large left-sided hydrocele.   PHYSICAL EXAM General - Well nourished, well developed, in no apparent distress.  Cardiovascular - Regular rate and rhythm. Pulmonary:  Distant breath sounds GI:  Soft , NT , ND, normal bowel sounds Extremities:  No C/C/E     Mental Status -  Level of arousal and orientation to year, age, place, and person were intact, but not orientated to month.  mild dysarthria. Follows commands well.  Cranial Nerves II - XII - II - Visual field intact OU. III, IV, VI - Extraocular movements intact. V - Facial sensation intact bilaterally. VII - right facial droop. VIII - Hearing & vestibular intact bilaterally. X - Palate elevates symmetrically, mild dysarthria. XI - Chin turning & shoulder shrug intact bilaterally. XII - Tongue protrusion intact.  Motor Strength - The patient's strength was normal in all extremities except right hand mild dexterity difficulty and pronator drift was absent.  Bulk was normal and fasciculations were absent.   Motor Tone - Muscle tone was assessed at the neck and appendages and was normal.  Sensory - Light touch, temperature/pinprick were assessed and were symmetrical.    Coordination - The patient had normal movements in the hands with no ataxia or dysmetria.   Gait and Station - not tested.   ASSESSMENT/PLAN [name] is a 71 y.o. male with history of hypertension and chronic headaches presenting with scrotal pain and difficulty walking. Developed right hemiparesis over the past few hours. CT found a L basal ganglia hemorrhage.  Stroke:  Left basal ganglia hemorrhage secondary to hypertensive emergency and cocaine abuse  Resultant  Right facial droop and right hand dexterity  difficulty  CT head L basal ganglia hemorrhage   CTA head and neck  No AVM or aneurysm, stable hematoma  2D Echo  EF 65-70%  LDL 69  HgbA1c 5.7  SCDs for VTE prophylaxis Diet Heart Room service appropriate? Yes; Fluid consistency: Thin  No antithrombotic prior to admission, now on No antithrombotic due to ICH  Ongoing aggressive stroke risk factor management  Therapy recommendations:  CIR. Consult order in place  Disposition:  CLR  Cocaine abuse  UDS positive for cocaine  Cessation counseling to be provided  Hypertensive emergency  Blood pressure on arrival, 245/133) setting of neurologic symptom development   Initially treated with cardene drip, now off  SBP goal < 150. Increase goal to < 180  BP controlled on lisinopril and clonidine  Long-term BP goal normotensive  Tobacco abuse  Current smoker  Smoking cessation counseling provided  Pt is willing to quit  Scrotal pain  [place] - large left-sided hydrocele  No acute intervention  Follow  up with PCP as outpt  Other Stroke Risk Factors  Advanced age  Obesity, Body mass index is 31.82 kg/m., recommend weight loss, diet and exercise as appropriate   Other Active Problems  CKD, stage I  New onset oxygen destaration  Hospital day # 0    I have personally examined this patient, reviewed notes, independently viewed imaging studies, participated in medical decision making and plan of care.ROS completed by me personally and pertinent positives fully documented  I have made any additions or clarifications directly to the above note.  . Transfer to  rehabilitation   when bed available.  F/U as outpatient in stroke clinic in 6 weeks.   Delia HeadyPramod Sethi, MD Medical Director [facility] Pager: [phone] 04/08/2016 1010 AM  To contact Stroke Continuity provider, please refer to [email or web address]. After hours, contact General Neurology

## 2016-04-08 NOTE — H&P (Signed)
Physical Medicine and Rehabilitation Admission H&P    CC:  Right sided weakness  HPI: Andrew Cuevas is a 71 y.o. male with history of HTN, chronic HA, prior CVA without significant residual deficits who presented to ED on 04/02/16 with one month history of testicular pain and was found to have right sided weakness, soft voice and mild facial weakness. History taken from chart review and patient. BP elevated at admission 245/133 and he was started on cardene drip. Left testicular pain felt to be due to very large left sided hydrocele--no intervention needed.  CT head reviewed, showing acute left basal ganglial hemorrhage with mild edema and atrophy.  CTA head and neck without stenosis, dissection, plaque or AVM. 2D echo with EF 65-70% with mild concentric LVH and grade 2 diastolic dysfunction. UDS positive for cocaine. BLE dopplers negative for DVT. Dr. Erlinda Hong felt that bleed due to HTN emergency and cocaine abuse. Therapy ongoing and patient noted to have poor safety with ADL tasks, problems sequencing, balance deficits with festinating gait. CIR recommended by MD and rehab team.    Review of Systems  HENT: Negative for hearing loss and tinnitus.   Eyes: Negative for blurred vision and double vision.  Respiratory: Negative for cough and shortness of breath.   Cardiovascular: Negative for chest pain and leg swelling.  Gastrointestinal: Negative for abdominal pain, constipation, heartburn and nausea.  Genitourinary: Negative for dysuria and urgency.  Musculoskeletal: Negative for back pain, joint pain, myalgias and neck pain.  Skin: Negative for itching and rash.  Neurological: Positive for sensory change and focal weakness. Negative for dizziness.  Psychiatric/Behavioral: The patient is not nervous/anxious and does not have insomnia.   All other systems reviewed and are negative.     Past Medical History:  Diagnosis Date  . Chronic headaches   . CVA (cerebral vascular accident) ([place])   .  HTN (hypertension), malignant     Past Surgical History:  Procedure Laterality Date  . HERNIA REPAIR Bilateral     Family History  Problem Relation Age of Onset  . Hypertension Mother   . Arthritis Mother   . Alzheimer's disease Father     Social History:  Single. Lives with sister. 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  His sister is retired and can provide supervision after discharge?  He reports that he has been smoking Cigarettes 1/2 PPD since age 1.   He has been smoking about 0.50 packs per day. He has never used smokeless tobacco. He denies use of alcohol or illicit drugs.     Allergies: No Known Allergies    Facility-Administered Medications Prior to Admission  Medication Dose Route Frequency Provider Last Rate Last Dose  . cloNIDine (CATAPRES) tablet 0.1 mg  0.1 mg Oral Daily Minus Breeding, MD   0.1 mg at 03/27/16 5009   Medications Prior to Admission  Medication Sig Dispense Refill  . acetaminophen (TYLENOL) 325 MG tablet Take 650 mg by mouth every 6 (six) hours as needed for mild pain.    . cloNIDine (CATAPRES) 0.1 MG tablet Take 1 tablet (0.1 mg total) by mouth 2 (two) times daily. 60 tablet 11  . lisinopril (PRINIVIL,ZESTRIL) 20 MG tablet Take 1 tablet (20 mg total) by mouth daily. 30 tablet 0    Home: Home Living Family/patient expects to be discharged to:: Inpatient rehab Living Arrangements: Other relatives Additional Comments: pt idnicates he lives with his sister.  Lives With: Family (sister)   Functional History: Prior Function  Level of Independence: Needs assistance Gait / Transfers Assistance Needed: pt indicates he has a RW, but that he is able to ambulate longer distances. ADL's / Homemaking Assistance Needed: pt states he performs his own ADLs, but that sister helps with homemaking tasks.   Comments: pt with difficulty answering questions, so may need to clarify PLOF with family.    Functional Status:  Mobility: Bed  Mobility Overal bed mobility: Needs Assistance Bed Mobility: Supine to Sit Supine to sit: Min assist General bed mobility comments: A with coming to sitting and steadying him while scooting to EOB.   Transfers Overall transfer level: Needs assistance Equipment used: Rolling walker (2 wheeled) Transfers: Sit to/from Stand Sit to Stand: Mod assist, +2 safety/equipment General transfer comment: cues for UE use and A for balance with coming to standing.  pt tends to reach out for RW and is uncoordinated at times.   Ambulation/Gait Ambulation/Gait assistance: Mod assist, +2 safety/equipment Ambulation Distance (Feet): 50 Feet Assistive device: Rolling walker (2 wheeled) Gait Pattern/deviations: Step-through pattern, Decreased stance time - right, Decreased step length - left, Decreased dorsiflexion - right, Trunk flexed, Drifts right/left General Gait Details: pt with very small steps and pushes RW far out in front of him despite verbal and tactile cues.  At times pt almost appears to have a festinating step and does better when given rhythmic R and L cues for moving each foot.  2nd person present for management of O2 and lines.      ADL: ADL Overall ADL's : Needs assistance/impaired Eating/Feeding: Set up, Sitting Grooming: Min guard, Sitting Upper Body Bathing: Min guard, Sitting Lower Body Bathing: Minimal assistance, Sit to/from stand Upper Body Dressing : Minimal assistance, Sitting Lower Body Dressing: Minimal assistance, Sit to/from stand Lower Body Dressing Details (indicate cue type and reason): Pt able to don socks sitting EOB with min guard assist. Anticipate he would require assist in standing due to poor balance Toilet Transfer: Moderate assistance, +2 for safety/equipment, Ambulation, RW, Cueing for safety, Cueing for sequencing Toilet Transfer Details (indicate cue type and reason): Simulated by sit to stand from EOB with functional mobility Functional mobility during ADLs:  Moderate assistance, +2 for safety/equipment, Rolling walker, Cueing for safety, Cueing for sequencing  Cognition: Cognition Overall Cognitive Status: Impaired/Different from baseline Arousal/Alertness: Awake/alert Orientation Level: Oriented X4 Attention: Sustained Sustained Attention: Impaired Sustained Attention Impairment: Verbal basic Memory: Impaired Memory Impairment: Storage deficit, Retrieval deficit, Decreased short term memory Decreased Short Term Memory: Verbal basic Awareness: Impaired Awareness Impairment: Intellectual impairment Problem Solving: Appears intact Safety/Judgment: Appears intact Cognition Arousal/Alertness: Awake/alert Behavior During Therapy: Flat affect Overall Cognitive Status: Impaired/Different from baseline Area of Impairment: Orientation, Attention, Memory, Following commands, Safety/judgement, Awareness, Problem solving Orientation Level: Disoriented to (pt not retaining name of hospital after given to him.) Current Attention Level: Selective Memory: Decreased short-term memory Following Commands: Follows one step commands with increased time, Follows multi-step commands inconsistently Safety/Judgement: Decreased awareness of safety, Decreased awareness of deficits Awareness: Intellectual Problem Solving: Slow processing, Decreased initiation, Difficulty sequencing, Requires verbal cues, Requires tactile cues General Comments: Unclear pt's baseline level of cognition, but is currently impaired.  pt mentioned living in [place], but then states he has been living with his sister for "a while".     Blood pressure (!) 153/76, pulse (!) 59, temperature 98.8 F (37.1 C), temperature source Oral, resp. rate 18, height 5' 10"  (1.778 m), weight 98.1 kg (216 lb 4.3 oz), SpO2 99 %. Physical Exam  Nursing note and  vitals reviewed. Constitutional: He appears well-developed and well-nourished.  HENT:  Head: Normocephalic and atraumatic.  Mouth/Throat:  Oropharynx is clear and moist.  Patches of vitiligo along forehead and pre-orbital area  Eyes: Conjunctivae and EOM are normal. Pupils are equal, round, and reactive to light.  Neck: Normal range of motion. Neck supple.  Cardiovascular: Normal rate and regular rhythm.   Respiratory: Effort normal and breath sounds normal. No stridor. No respiratory distress. He has no wheezes.  GI: Soft. Bowel sounds are normal. He exhibits no distension. There is no tenderness.  Musculoskeletal: He exhibits no edema or tenderness.  Neurological: He is alert.  Flat affect with soft but clear voice.  Alert and oriented x3 with increased time. Delayed responses. He is able to follow simple one and two step commands. Motor: RUE: 4/5 proximal to distal Minimal right facial weakness.  Motor: RUE/RLE: 4/5 proximal to distal LUE/LLE: 5/5 proximal to distal DTRs symmetric   Skin: Skin is warm and dry.  Psychiatric: His affect is blunt. His speech is delayed. He is slowed.    Results for orders placed or performed during the hospital encounter of 04/02/16 (from the past 48 hour(s))  CBC     Status: Abnormal   Collection Time: 04/06/16  2:39 AM  Result Value Ref Range   WBC 5.7 4.0 - 10.5 K/uL   RBC 4.14 (L) 4.22 - 5.81 MIL/uL   Hemoglobin 10.4 (L) 13.0 - 17.0 g/dL   HCT 32.6 (L) 39.0 - 52.0 %   MCV 78.7 78.0 - 100.0 fL   MCH 25.1 (L) 26.0 - 34.0 pg   MCHC 31.9 30.0 - 36.0 g/dL   RDW 16.3 (H) 11.5 - 15.5 %   Platelets 213 150 - 400 K/uL  Basic metabolic panel     Status: Abnormal   Collection Time: 04/06/16  2:39 AM  Result Value Ref Range   Sodium 136 135 - 145 mmol/L   Potassium 3.8 3.5 - 5.1 mmol/L   Chloride 106 101 - 111 mmol/L   CO2 23 22 - 32 mmol/L   Glucose, Bld 96 65 - 99 mg/dL   BUN 11 6 - 20 mg/dL   Creatinine, Ser 1.30 (H) 0.61 - 1.24 mg/dL   Calcium 8.8 (L) 8.9 - 10.3 mg/dL   GFR calc non Af Amer 53 (L) >60 mL/min   GFR calc Af Amer >60 >60 mL/min    Comment: (NOTE) The eGFR has  been calculated using the CKD EPI equation. This calculation has not been validated in all clinical situations. eGFR's persistently <60 mL/min signify possible Chronic Kidney Disease.    Anion gap 7 5 - 15   Dg Chest Port 1 View  Result Date: 04/05/2016 CLINICAL DATA:  Oxygen desaturation EXAM: PORTABLE CHEST 1 VIEW COMPARISON:  None. FINDINGS: No pneumothorax. Mild opacity in the right lung base is somewhat platelike in appearance. Cardiomegaly. The hila and mediastinum are unremarkable given portable technique. No other abnormalities. IMPRESSION: Opacity in the right lung base is somewhat platelike and may represent atelectasis. Recommend follow-up to resolution. No other acute abnormalities. Electronically Signed   By: Dorise Bullion III M.D   On: 04/05/2016 10:29       Medical Problem List and Plan: 1.  Poor safety awareness, limitations with self-care, balance deficits secondary to left basal ganglial hemorrhage. 2.  DVT Prophylaxis/Anticoagulation: Pharmaceutical: Lovenox 3. Pain Management: N/A 4. Mood: LCSW to follow for evaluation and support 5. Neuropsych: This patient is not fully capable of making  decisions on his own behalf. 6. Skin/Wound Care: Maintain adequate nutritional and hydration status. Routine pressure relief measures.   7. Fluids/Electrolytes/Nutrition: Monitor I/O. Check lytes in am.  8. HTN: Monitor BP bid-continue catapres and lisinopril.  9. Tobacco/ cocaine abuse: continue to educate on importance of cessation.  10. Large left sided hydrocele: follow up with urology after discharge.  11. B 12 deficiency with anemia:Question anemia of chronic disease-- B 12 level- 136. Will supplement. Also add iron and monitor.  12. CKD: Baseline SCr -1.3. Continue to monitor with serial checks.   Post Admission Physician Evaluation: 1. Preadmission assessment reviewed and changes made below. 2. Functional deficits secondary  to left basal ganglial hemorrhage.. 3. Patient  is admitted to receive collaborative, interdisciplinary care between the physiatrist, rehab nursing staff, and therapy team. 4. Patient's level of medical complexity and substantial therapy needs in context of that medical necessity cannot be provided at a lesser intensity of care such as a SNF. 5. Patient has experienced substantial functional loss from his/her baseline which was documented above under the "Functional History" and "Functional Status" headings.  Judging by the patient's diagnosis, physical exam, and functional history, the patient has potential for functional progress which will result in measurable gains while on inpatient rehab.  These gains will be of substantial and practical use upon discharge  in facilitating mobility and self-care at the household level. 6. Physiatrist will provide 24 hour management of medical needs as well as oversight of the therapy plan/treatment and provide guidance as appropriate regarding the interaction of the two. 7. The Preadmission Screening has been reviewed and patient status is unchanged unless otherwise stated above. 8. 24 hour rehab nursing will assist with safety, disease management, medication administration and patient education  and help integrate therapy concepts, techniques,education, etc. 9. PT will assess and treat for/with: Lower extremity strength, range of motion, stamina, balance, functional mobility, safety, adaptive techniques and equipment, coping skills, pain control, stroke education.   Goals are: Supervision/Min A. 10. OT will assess and treat for/with: ADL's, functional mobility, safety, upper extremity strength, adaptive techniques and equipment, ego support, and community reintegration.   Goals are: Supervision/Min A. Therapy may proceed with showering this patient. 11. SLP will assess and treat for/with: cognition.  Goals are: supervision. 12. Case Management and Social Worker will assess and treat for psychological issues and  discharge planning. 68. Team conference will be held weekly to assess progress toward goals and to determine barriers to discharge. 14. Patient will receive at least 3 hours of therapy per day at least 5 days per week. 15. ELOS: 13-17 days.       16. Prognosis:  good   Delice Lesch, MD, [address] [place] 04/07/2016

## 2016-04-08 NOTE — Discharge Summary (Signed)
Stroke Discharge Summary  Patient ID: Andrew Cuevas    l   MRN: [id number]      DOB: August 26, 1945  Date of Admission: 04/02/2016 Date of Discharge: 04/08/2016  Attending Physician:  Micki RileyPramod S Sethi, MD, Stroke MD Consultant(s):  Maryla MorrowAnkit Patel, MD (Physical Medicine & Rehabtilitation) Patient's PCP:  Sharlene DoryNicholas Paul Wendling, DO  Discharge Diagnoses:    ICH (intracerebral hemorrhage) ([facility]) - Left basal ganglia hemorrhage secondary to hypertensive emergency and cocaine abuse   Oxygen desaturation, resolved   Intractable hiccups   Left hydrocele   Tobacco abuse   Diastolic dysfunction   Stage 2 chronic kidney disease   Benign essential HTN   Chronic nonintractable headache   History of CVA with residual deficit   Acute blood loss anemia   Cocaine abuse   Hypertensive emergency   Obesity, Body mass index is 31.03 kg/m.  BMI  Body mass index is 31.03 kg/m.  Past Medical History:  Diagnosis Date  . Chronic headaches   . CVA (cerebral vascular accident) ([facility])   . HTN (hypertension), malignant    Past Surgical History:  Procedure Laterality Date  . HERNIA REPAIR Bilateral     Medications to be continued on Rehab . cloNIDine  0.1 mg Oral BID  . lisinopril  20 mg Oral Daily  . mouth rinse  15 mL Mouth Rinse BID  . pantoprazole  40 mg Oral Daily  . senna-docusate  1 tablet Oral BID    LABORATORY STUDIES CBC    Component Value Date/Time   WBC 5.7 04/06/2016 0239   RBC 4.14 (L) 04/06/2016 0239   HGB 10.4 (L) 04/06/2016 0239   HCT 32.6 (L) 04/06/2016 0239   PLT 213 04/06/2016 0239   MCV 78.7 04/06/2016 0239   MCH 25.1 (L) 04/06/2016 0239   MCHC 31.9 04/06/2016 0239   RDW 16.3 (H) 04/06/2016 0239   LYMPHSABS 2.7 04/02/2016 1308   MONOABS 0.5 04/02/2016 1308   EOSABS 0.0 04/02/2016 1308   BASOSABS 0.0 04/02/2016 1308   CMP    Component Value Date/Time   NA 136 04/06/2016 0239   K 3.8 04/06/2016 0239   CL 106 04/06/2016 0239   CO2 23 04/06/2016 0239   GLUCOSE 96 04/06/2016  0239   BUN 11 04/06/2016 0239   CREATININE 1.30 (H) 04/06/2016 0239   CALCIUM 8.8 (L) 04/06/2016 0239   PROT 7.6 04/02/2016 1604   ALBUMIN 3.8 04/02/2016 1604   AST 24 04/02/2016 1604   ALT 17 04/02/2016 1604   ALKPHOS 64 04/02/2016 1604   BILITOT 0.8 04/02/2016 1604   GFRNONAA 53 (L) 04/06/2016 0239   GFRAA >60 04/06/2016 0239   COAGS Lab Results  Component Value Date   INR 1.02 04/02/2016   Lipid Panel    Component Value Date/Time   CHOL 142 03/25/2016 1141   TRIG 118.0 03/25/2016 1141   HDL 48.80 03/25/2016 1141   CHOLHDL 3 03/25/2016 1141   VLDL 23.6 03/25/2016 1141   LDLCALC 69 03/25/2016 1141   HgbA1C  Lab Results  Component Value Date   HGBA1C 5.7 (H) 04/03/2016   Urine Drug Screen     Component Value Date/Time   LABOPIA NONE DETECTED 04/03/2016 0800   COCAINSCRNUR POSITIVE (A) 04/03/2016 0800   LABBENZ NONE DETECTED 04/03/2016 0800   AMPHETMU NONE DETECTED 04/03/2016 0800   THCU NONE DETECTED 04/03/2016 0800   LABBARB NONE DETECTED 04/03/2016 0800     SIGNIFICANT DIAGNOSTIC STUDIES Ct Head Wo Contrast 04/02/2016 1. Acute left basal ganglial  hemorrhage with mild surrounding edema. This may be hypertensive in origin. 2. Slightly more dense left middle cerebral artery proximally of questionable significance. Consider MRI if warranted. 3. Diffuse atrophy and small vessel disease. 4. Fluid within the right maxillary sinus.   CT angiogram head and neck No change in size of left basal ganglia intraparenchymal hematoma. The vessels are tortuous as might be seen with hypertension, but there is no evidence of atherosclerotic plaque, stenosis, dissection, aneurysm or vascular malformation.  2-D echocardiogram - Left ventricle: The cavity size was normal. There was mildconcentric hypertrophy. Systolic function was vigorous. Theestimated ejection fraction was in the range of 65% to 70%. Wallmotion was normal; there were no regional wall motionabnormalities. Features  are consistent with a pseudonormal leftventricular filling pattern, with concomitant abnormal relaxationand increased filling pressure (grade 2 diastolic dysfunction).Doppler parameters are consistent with high ventricular fillingpressure. - Aortic valve: Poorly visualized. Trileaflet; mildly thickened,mildly calcified leaflets. - Aorta: Aortic root dimension: 45 mm (ED). - Aortic root: The aortic root was moderately dilated. - Left atrium: The atrium was severely dilated. - Pulmonary arteries: Systolic pressure could not be accuratelyestimated.  LE venous doppler  No evidence of deep vein thrombosis involving the right lower extremity and left lower extremity.  No evidence of Baker&'s cyst on the right or left.  US Scrotum US Art/ven Flow Abd Pelv Doppler 04/02/2016 1. Small benign-appearing bilateral intratesticular cysts. 2. Numerous bilateral epididymal cysts. 3. Very large left-sided hydrocele.    HISTORY OF PRESENT ILLNESS Andrew [name] a 71 y.o.malewith an unclear last seen well given that he did not really notice the symptoms that much but does state that he has had worse difficult to walking recently. It is suspected that after arriving here, he developed right-sided mild hemiparesis but has been stable over the past few hours. A CT was done which showed a basal ganglia hemorrhage.He actually presented due to testicular pain which the ED evaluated and feels is hydrocele with no need for acute management. He was last known well is unclear. ICH Score: 0. He was admitted to the neuro ICU for further evaluation and treatment.    HOSPITAL COURSE Andrew Cuevas is a 71 y.o. male with history of hypertension and chronic headaches presenting with scrotal pain and difficulty walking. Developed right hemiparesis over the past few hours. CT found a L basal ganglia hemorrhage. Hemorrhage found to be secondary to hypertensive emergency and cocaine abuse. He also smokes cigarettes.  Incidental scrotal pain due to large left hydrocele for which he can follow up as an outpatient. He will be discharged to rehabilitation for ongoing therapy needs.  Stroke:  Left basal ganglia hemorrhage secondary to hypertensive emergency and cocaine abuse  Resultant  Right facial droop and right hand dexterity difficulty  CT head L basal ganglia hemorrhage   CTA head and neck  No AVM or aneurysm, stable hematoma  2D Echo  EF 65-70%  LDL 69  HgbA1c 5.7  No antithrombotic prior to admission, now on No antithrombotic due to ICH  Ongoing aggressive stroke risk factor management  Therapy recommendations:  CIR  Disposition:  CIR  Cocaine abuse  UDS positive for cocaine  Cessation counseling to be provided  Hypertensive emergency  Blood pressure on arrival, 245/133) setting of neurologic symptom development   Initially treated with cardene drip  Initial blood pressure goal less than 140, increase to < 180 once stable   By discharge BP controlled on lisinopril and clonidine  Long-term BP goal normotensive  Tobacco  abuse  Current smoker  Smoking cessation counseling provided  Pt is willing to quit  Scrotal pain  [place] - large left-sided hydrocele  No acute intervention  Follow up with PCP as outpt  Other Stroke Risk Factors  Advanced age  Obesity, Body mass index is 31.03 kg/m., recommend weight loss, diet and exercise as appropriate   Other Active Problems  CKD, stage I  New onset oxygen desaturation resolved   DISCHARGE EXAM Blood pressure (!) 153/92, pulse 66, temperature 98.6 F (37 C), temperature source Tympanic, resp. rate 15, height 5\' 10"  (1.778 m), weight 98.1 kg (216 lb 4.3 oz), SpO2 99 %. General - Well nourished, well developed, in no apparent distress.  Cardiovascular - Regular rate and rhythm. Pulmonary:  Distant breath sounds GI:  Soft , NT , ND, normal bowel sounds Extremities:  No C/C/E  Mental Status -  Level of  arousal and orientation to year, age, place, and person were intact, but not orientated to month.  mild dysarthria. Follows commands well.  Cranial Nerves II - XII - II - Visual field intact OU. III, IV, VI - Extraocular movements intact. V - Facial sensation intact bilaterally. VII - right facial droop. VIII - Hearing & vestibular intact bilaterally. X - Palate elevates symmetrically, mild dysarthria. XI - Chin turning & shoulder shrug intact bilaterally. XII - Tongue protrusion intact.  Motor Strength - The patient's strength was normal in all extremities except right hand mild dexterity difficulty and pronator drift was absent.  Bulk was normal and fasciculations were absent.   Motor Tone - Muscle tone was assessed at the neck and appendages and was normal.  Sensory - Light touch, temperature/pinprick were assessed and were symmetrical.    Coordination - The patient had normal movements in the hands with no ataxia or dysmetria.   Gait and Station - not tested.  Discharge Diet  Diet regular Room service appropriate? Yes; Fluid consistency: Thin liquids  DISCHARGE PLAN  Disposition:  Transfer to [facility] for ongoing PT, OT and ST  Recommend ongoing risk factor control by Primary Care Physician at time of discharge from inpatient rehabilitation.  Follow-up Sharlene Dory, DO in 2 weeks following discharge from rehab - hospital follow-up and hydrocele evaluation  Follow-up with Dr. Delia Heady, Stroke Clinic in 6 weeks, office to schedule an appointment.   32 minutes were spent preparing discharge.  Rhoderick Moody [facility] See Amion for Pager information 04/09/2016 8:10 PM

## 2016-04-08 NOTE — Progress Notes (Signed)
Andrew Karis JubaAnil Patel, MD Physician Signed Physical Medicine and Rehabilitation  Consult Note Date of Service: 04/06/2016 8:51 AM  Related encounter: ED to Hosp-Admission (Current) [facility]     Expand All Collapse All   [] Hide copied text      Physical Medicine and Rehabilitation Consult   Reason for Consult: right sided weakness Referring Physician: Dr. Pearlean BrownieSethi   HPI: Andrew Cuevas is a 71 y.o. male with history of HTN, chronic HA, prior CVA with residual deficits who presented to ED on 04/02/16 with one month history of testicular pain and was found to have right sided weakness, soft voice and mild facial weakness. History taken from chart review and patient. BP elevated at admission 245/133 and he was started on cardene drip. Left testicular pain felt to be due to large hydrocele--no intervention needed.  CT head reviewed, showing acute left basal ganglial hemorrhage with mild edema and atrophy.  CTA head and neck without stenosis, dissection, plaque or AVM. 2D echo with EF 65-70% with mild concentric LVH and grade 2 diastolic dysfunction. UDS positive for cocaine. Dr. Roda ShuttersXu felt that bleed due to HTN emergency and cocaine abuse. PT/OT evaluations done yesterday revealing difficulty with ability to follow commands, poor safety with ADL tasks and problems sequencing, balance deficits and difficulty walking due to right sided weakness. CIR recommended for follow up therapy.     Review of Systems  HENT: Negative for hearing loss and tinnitus.   Eyes: Negative for blurred vision and double vision.  Respiratory: Negative for cough and shortness of breath.   Cardiovascular: Negative for chest pain and palpitations.  Gastrointestinal: Negative for constipation and heartburn.  Genitourinary: Negative for frequency and urgency.  Musculoskeletal: Negative for back pain, joint pain and myalgias.  Neurological: Positive for tremors. Negative for  sensory change and headaches.  Psychiatric/Behavioral: The patient does not have insomnia.   All other systems reviewed and are negative.         Past Medical History:  Diagnosis Date  . Chronic headaches   . CVA (cerebral vascular accident) (HCC)   . HTN (hypertension), malignant          Past Surgical History:  Procedure Laterality Date  . HERNIA REPAIR Bilateral          Family History  Problem Relation Age of Onset  . Hypertension Mother   . Arthritis Mother   . Alzheimer's disease Father     Social History:  Single. Lives with sister. 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 Sister retired and can provide supervision? He reports that he has been smoking Cigarettes 1/2 PPD since age 71.   He has been smoking about 0.50 packs per day. He has never used smokeless tobacco. He reports that he does not drink alcohol or use drugs.    Allergies: No Known Allergies             Facility-Administered Medications Prior to Admission  Medication Dose Route Frequency Provider Last Rate Last Dose  . cloNIDine (CATAPRES) tablet 0.1 mg  0.1 mg Oral Daily Rollene RotundaJames Hochrein, MD   0.1 mg at 03/27/16 [id number]         Medications Prior to Admission  Medication Sig Dispense Refill  . acetaminophen (TYLENOL) 325 MG tablet Take 650 mg by mouth every 6 (six) hours as needed for mild pain.    . cloNIDine (CATAPRES) 0.1 MG tablet Take 1 tablet (0.1 mg total) by mouth  2 (two) times daily. 60 tablet 11  . lisinopril (PRINIVIL,ZESTRIL) 20 MG tablet Take 1 tablet (20 mg total) by mouth daily. 30 tablet 0    Home: Home Living Family/patient expects to be discharged to:: Inpatient rehab Living Arrangements: Other relatives Additional Comments: pt idnicates he lives with his sister.  Lives With: Family (sister)  Functional History: Prior Function Level of Independence: Needs assistance Gait / Transfers Assistance Needed: pt indicates he has a RW, but that he is  able to ambulate longer distances. ADL's / Homemaking Assistance Needed: pt states he performs his own ADLs, but that sister helps with homemaking tasks.   Comments: pt with difficulty answering questions, so may need to clarify PLOF with family.   Functional Status:  Mobility: Bed Mobility Overal bed mobility: Needs Assistance Bed Mobility: Supine to Sit Supine to sit: Mod assist, HOB elevated General bed mobility comments: pt initiates coming to sitting with only verbal cue, but is unable to bring trunk upright and needs A with trunk and scooting hips closer to EOB.   Transfers Overall transfer level: Needs assistance Equipment used: Rolling walker (2 wheeled) Transfers: Sit to/from Stand Sit to Stand: Mod assist, +2 safety/equipment General transfer comment: Max cueing for safe technique.  pt tends to grab RW and use momentum to bring himself to standing and then uncontrolled descent to sitting.   Ambulation/Gait Ambulation/Gait assistance: Mod assist, +2 safety/equipment Ambulation Distance (Feet): 40 Feet Assistive device: Rolling walker (2 wheeled) Gait Pattern/deviations: Step-to pattern, Decreased step length - right, Decreased dorsiflexion - right, Drifts right/left, Trunk flexed General Gait Details: pt needs consistent ModA for balance and management of RW.  pt tends to push RW very far out in front of him and steps outside RW with turns despite max cueing and PT A with movement of RW.  2nd person present for management of lines, IV pole, and for safety.    ADL: ADL Overall ADL's : Needs assistance/impaired Eating/Feeding: Set up, Sitting Grooming: Min guard, Sitting Upper Body Bathing: Min guard, Sitting Lower Body Bathing: Minimal assistance, Sit to/from stand Upper Body Dressing : Minimal assistance, Sitting Lower Body Dressing: Minimal assistance, Sit to/from stand Lower Body Dressing Details (indicate cue type and reason): Pt able to don socks sitting EOB with min  guard assist. Anticipate he would require assist in standing due to poor balance Toilet Transfer: Moderate assistance, +2 for safety/equipment, Ambulation, RW, Cueing for safety, Cueing for sequencing Toilet Transfer Details (indicate cue type and reason): Simulated by sit to stand from EOB with functional mobility Functional mobility during ADLs: Moderate assistance, +2 for safety/equipment, Rolling walker, Cueing for safety, Cueing for sequencing  Cognition: Cognition Overall Cognitive Status: Impaired/Different from baseline Arousal/Alertness: Awake/alert Orientation Level: Oriented X4 Attention: Sustained Sustained Attention: Impaired Sustained Attention Impairment: Verbal basic Memory: Impaired Memory Impairment: Storage deficit, Retrieval deficit, Decreased short term memory Decreased Short Term Memory: Verbal basic Awareness: Impaired Awareness Impairment: Intellectual impairment Problem Solving: Appears intact Safety/Judgment: Appears intact Cognition Arousal/Alertness: Awake/alert Behavior During Therapy: Flat affect Overall Cognitive Status: Impaired/Different from baseline Area of Impairment: Orientation, Attention, Memory, Following commands, Safety/judgement, Awareness, Problem solving Orientation Level: Disoriented to (pt knows hospital in [place], not name) Current Attention Level: Selective Memory: Decreased short-term memory Following Commands: Follows one step commands with increased time, Follows multi-step commands inconsistently Safety/Judgement: Decreased awareness of safety, Decreased awareness of deficits Awareness: Intellectual Problem Solving: Slow processing, Decreased initiation, Difficulty sequencing, Requires verbal cues, Requires tactile cues General Comments: Unclear pt's baseline level of  cognition, but is currently impaired.  pt mentioned living in [place], but then states he has been living with his sister for "a while".    Blood pressure 135/83, pulse  66, temperature 97.7 F (36.5 C), temperature source Oral, resp. rate 12, height 5\' 10"  (1.778 m), weight 98.1 kg (216 lb 4.3 oz), SpO2 93 %. Physical Exam  Nursing note and vitals reviewed. Constitutional: He is oriented to person, place, and time. He appears well-developed.  Obese  HENT:  Head: Normocephalic and atraumatic.  Mouth/Throat: Oropharynx is clear and moist.  Eyes: Conjunctivae and EOM are normal. Pupils are equal, round, and reactive to light.  Neck: Normal range of motion. Neck supple.  Cardiovascular: Normal rate and regular rhythm.   Respiratory: Effort normal and breath sounds normal. No stridor. No respiratory distress. He has no wheezes.  GI: Soft. He exhibits no distension. There is no tenderness.  Musculoskeletal: He exhibits no edema or tenderness.  Neurological: He is alert and oriented to person, place, and time. Coordination abnormal.  Flat affect with soft but clear voice.  Minimal right facial weakness.  Delayed responses.  Able to follow simple one and two step motor commands. Motor: RUE/RLE: 4+/5 proximal to distal LUE/LLE: 5/5 proximal to distal Sensation intact to light touch DTRs symmetric  Skin: Skin is warm and dry.  Psychiatric: His affect is blunt. His speech is delayed. He is slowed.    Lab Results Last 24 Hours       Results for orders placed or performed during the hospital encounter of 04/02/16 (from the past 24 hour(s))  CBC     Status: Abnormal   Collection Time: 04/06/16  2:39 AM  Result Value Ref Range   WBC 5.7 4.0 - 10.5 K/uL   RBC 4.14 (L) 4.22 - 5.81 MIL/uL   Hemoglobin 10.4 (L) 13.0 - 17.0 g/dL   HCT 16.1 (L) 09.6 - 04.5 %   MCV 78.7 78.0 - 100.0 fL   MCH 25.1 (L) 26.0 - 34.0 pg   MCHC 31.9 30.0 - 36.0 g/dL   RDW 40.9 (H) 81.1 - 91.4 %   Platelets 213 150 - 400 K/uL  Basic metabolic panel     Status: Abnormal   Collection Time: 04/06/16  2:39 AM  Result Value Ref Range   Sodium 136 135 - 145 mmol/L    Potassium 3.8 3.5 - 5.1 mmol/L   Chloride 106 101 - 111 mmol/L   CO2 23 22 - 32 mmol/L   Glucose, Bld 96 65 - 99 mg/dL   BUN 11 6 - 20 mg/dL   Creatinine, Ser 7.82 (H) 0.61 - 1.24 mg/dL   Calcium 8.8 (L) 8.9 - 10.3 mg/dL   GFR calc non Af Amer 53 (L) >60 mL/min   GFR calc Af Amer >60 >60 mL/min   Anion gap 7 5 - 15      Imaging Results (Last 48 hours)  Dg Chest Port 1 View  Result Date: 04/05/2016 CLINICAL DATA:  Oxygen desaturation EXAM: PORTABLE CHEST 1 VIEW COMPARISON:  None. FINDINGS: No pneumothorax. Mild opacity in the right lung base is somewhat platelike in appearance. Cardiomegaly. The hila and mediastinum are unremarkable given portable technique. No other abnormalities. IMPRESSION: Opacity in the right lung base is somewhat platelike and may represent atelectasis. Recommend follow-up to resolution. No other acute abnormalities. Electronically Signed   By: Gerome Sam III M.D   On: 04/05/2016 10:29     Assessment/Plan: Diagnosis: Left basal ganglial hemorrhage Labs and  images independently reviewed.  Records reviewed and summated above. Stroke: Continue secondary stroke prophylaxis and Risk Factor Modification listed below:   Blood Pressure Management:  Continue current medication with prn's with permisive HTN per primary team Prediabetes management:   Tobacco abuse:   Right sided hemiparesis  1. Does the need for close, 24 hr/day medical supervision in concert with the patient's rehab needs make it unreasonable for this patient to be served in a less intensive setting? Yes  2. Co-Morbidities requiring supervision/potential complications: Tobacco abuse (counsel), cocaine abuse (counsel), grade 2 diastolic dysfunction (monitor for signs/symptoms of fluid overload), HTN (monitor and provide prns in accordance with increased physical exertion and pain), chronic HA (monitor, treat as necessary), prior CVA with residual deficits, CKD (avoid nephrotoxic meds), ABLA  (transfuse if necessary to ensure appropriate perfusion for increased activity tolerance 1. Due to safety, disease management, medication administration and patient education, does the patient require 24 hr/day rehab nursing? Yes 2. Does the patient require coordinated care of a physician, rehab nurse, PT (1-2 hrs/day, 5 days/week), OT (1-2 hrs/day, 5 days/week) and SLP (1-2 hrs/day, 5 days/week) to address physical and functional deficits in the context of the above medical diagnosis(es)? Yes Addressing deficits in the following areas: balance, endurance, locomotion, strength, transferring, bathing, dressing, toileting, cognition and psychosocial support 3. Can the patient actively participate in an intensive therapy program of at least 3 hrs of therapy per day at least 5 days per week? Yes 4. The potential for patient to make measurable gains while on inpatient rehab is excellent 5. Anticipated functional outcomes upon discharge from inpatient rehab are supervision  with PT, supervision with OT, supervision with SLP. 6. Estimated rehab length of stay to reach the above functional goals is: 12-16 days. 7. Does the patient have adequate social supports and living environment to accommodate these discharge functional goals? Potentially 8. Anticipated D/C setting: Home 9. Anticipated post D/C treatments: HH therapy and Home excercise program 10. Overall Rehab/Functional Prognosis: good  RECOMMENDATIONS: This patient's condition is appropriate for continued rehabilitative care in the following setting: CIR if caregiver support available upon discharge. Patient has agreed to participate in recommended program. Potentially Note that insurance prior authorization may be required for reimbursement for recommended care.  Comment: Rehab Admissions Coordinator to follow up.  Maryla Morrow, MD, Earlie Counts, [place] 04/06/2016

## 2016-04-09 ENCOUNTER — Ambulatory Visit: Payer: Medicare Other | Admitting: Cardiology

## 2016-04-09 ENCOUNTER — Inpatient Hospital Stay (HOSPITAL_COMMUNITY): Payer: Medicare Other | Admitting: Physical Therapy

## 2016-04-09 ENCOUNTER — Inpatient Hospital Stay (HOSPITAL_COMMUNITY): Payer: Medicare Other | Admitting: Occupational Therapy

## 2016-04-09 ENCOUNTER — Inpatient Hospital Stay (HOSPITAL_COMMUNITY): Payer: Medicare Other | Admitting: Speech Pathology

## 2016-04-09 DIAGNOSIS — G8191 Hemiplegia, unspecified affecting right dominant side: Secondary | ICD-10-CM

## 2016-04-09 DIAGNOSIS — I619 Nontraumatic intracerebral hemorrhage, unspecified: Secondary | ICD-10-CM

## 2016-04-09 DIAGNOSIS — I69393 Ataxia following cerebral infarction: Secondary | ICD-10-CM

## 2016-04-09 LAB — COMPREHENSIVE METABOLIC PANEL
ALK PHOS: 53 U/L (ref 38–126)
ALT: 15 U/L — AB (ref 17–63)
AST: 21 U/L (ref 15–41)
Albumin: 3.3 g/dL — ABNORMAL LOW (ref 3.5–5.0)
Anion gap: 10 (ref 5–15)
BUN: 13 mg/dL (ref 6–20)
CALCIUM: 9.1 mg/dL (ref 8.9–10.3)
CO2: 22 mmol/L (ref 22–32)
CREATININE: 1.29 mg/dL — AB (ref 0.61–1.24)
Chloride: 104 mmol/L (ref 101–111)
GFR, EST NON AFRICAN AMERICAN: 54 mL/min — AB (ref >60)
Glucose, Bld: 92 mg/dL (ref 65–99)
Potassium: 4.1 mmol/L (ref 3.5–5.1)
Sodium: 136 mmol/L (ref 135–145)
TOTAL PROTEIN: 6.9 g/dL (ref 6.5–8.1)
Total Bilirubin: 0.6 mg/dL (ref 0.3–1.2)

## 2016-04-09 LAB — CBC WITH DIFFERENTIAL/PLATELET
BASOS PCT: 1 %
Basophils Absolute: 0 10*3/uL (ref 0.0–0.1)
EOS PCT: 4 %
Eosinophils Absolute: 0.2 10*3/uL (ref 0.0–0.7)
HCT: 33.4 % — ABNORMAL LOW (ref 39.0–52.0)
Hemoglobin: 10.5 g/dL — ABNORMAL LOW (ref 13.0–17.0)
Lymphocytes Relative: 58 %
Lymphs Abs: 2.2 10*3/uL (ref 0.7–4.0)
MCH: 24.9 pg — AB (ref 26.0–34.0)
MCHC: 31.4 g/dL (ref 30.0–36.0)
MCV: 79.3 fL (ref 78.0–100.0)
MONO ABS: 0.5 10*3/uL (ref 0.1–1.0)
Monocytes Relative: 12 %
NEUTROS PCT: 25 %
Neutro Abs: 1 10*3/uL — ABNORMAL LOW (ref 1.7–7.7)
PLATELETS: 232 10*3/uL (ref 150–400)
RBC: 4.21 MIL/uL — ABNORMAL LOW (ref 4.22–5.81)
RDW: 16 % — ABNORMAL HIGH (ref 11.5–15.5)
WBC: 3.9 10*3/uL — ABNORMAL LOW (ref 4.0–10.5)

## 2016-04-09 NOTE — Evaluation (Signed)
Speech Language Pathology Assessment and Plan  Patient Details  Name: Andrew Cuevas MRN: [id number] Date of Birth: September 06, 1945  SLP Diagnosis: Speech and Language deficits;Cognitive Impairments  Rehab Potential: Good ELOS: 12-14 days     Today's Date: 04/09/2016 SLP Individual Time: 0725-0820 SLP Individual Time Calculation (min): 55 min   Problem List:  Patient Active Problem List   Diagnosis Date Noted  . Nontraumatic acute hemorrhage of basal ganglia ([place]) 04/08/2016  . Vitamin B 12 deficiency   . Cocaine abuse   . Right hemiparesis ([place])   . Hemorrhagic stroke ([place])   . Left hydrocele   . Tobacco abuse   . Diastolic dysfunction   . Stage 2 chronic kidney disease   . Benign essential HTN   . Chronic nonintractable headache   . History of CVA with residual deficit   . Acute blood loss anemia   . Oxygen desaturation   . Intractable hiccups   . ICH (intracerebral hemorrhage) ([place])   . Stroke (cerebrum) ([place]) 04/02/2016  . Malignant hypertension 03/28/2016  . Hypertensive heart disease without heart failure 03/28/2016   Past Medical History:  Past Medical History:  Diagnosis Date  . Chronic headaches   . CVA (cerebral vascular accident) ([place])   . HTN (hypertension), malignant    Past Surgical History:  Past Surgical History:  Procedure Laterality Date  . HERNIA REPAIR Bilateral     Assessment / Plan / Recommendation Clinical Impression Patient is a 71 y.o.malewith history of HTN, chronic HA, prior CVA without significant residual deficits who presented to ED on 04/02/16 with one month history of testicular pain and was found to have right sided weakness, soft voice and mild facial weakness. History taken from chart review and patient. BP elevated at admission 245/133 and he was started on cardene drip. Left testicular pain felt to be due to very large left sided hydrocele--no intervention needed. CT head reviewed, showing acute left basal ganglial hemorrhage with mild  edema and atrophy. CTA head and neck without stenosis, dissection, plaque or AVM. 2D echo with EF 65-70% with mild concentric LVH and grade 2 diastolic dysfunction. UDS positive for cocaine. BLE dopplers negative for DVT. Dr. Erlinda Hong felt that bleed due to HTN emergency and cocaine abuse. Therapy ongoing and patient noted to have poor safety with ADL tasks, problems sequencing, balance deficits with festinating gait. CIR recommended by MD and rehab team and patient admitted 04/08/16.  Patient demonstrates moderate cognitive impairments characterized by impaired problem solving, recall of new information, intellectual awareness and overall safety with functional and familiar tasks. Patient also demonstrates mild impairments in receptive language characterized by decreased comprehension of multi-step commands and expressive language characterized by decreased word-finding, especially with generative naming tasks. Patient would benefit from skilled SLP intervention to maximize his cognitive-linguistic function and overall functional independence prior to discharge.    Skilled Therapeutic Interventions          Administered a cognitive-linguistic evaluation. Please see above for details. Educated patient on current cognitive-linguistic deficits and goals of skilled SLP intervention. He verbalized understanding and agreement.    SLP Assessment  Patient will need skilled [place] [facility] Services during CIR admission    Recommendations  Oral Care Recommendations: Oral care BID Recommendations for Other Services: Neuropsych consult Patient destination: Home Follow up Recommendations: 24 hour supervision/assistance;Home Health SLP;Outpatient SLP Equipment Recommended: None recommended by SLP    SLP Frequency 3 to 5 out of 7 days   SLP Duration  SLP Intensity  SLP  Treatment/Interventions 12-14 days   Minumum of 1-2 x/day, 30 to 90 minutes  Cueing hierarchy;Cognitive  remediation/compensation;Patient/family education;Functional tasks;Environmental controls;Therapeutic Activities;Internal/external aids;Speech/Language facilitation    Pain Pain Assessment Pain Assessment: No/denies pain  Prior Functioning Type of Home: House  Lives With: Family (sister) Available Help at Discharge: Family;Available 24 hours/day  Function:   Cognition Comprehension Comprehension assist level: Understands basic 90% of the time/cues < 10% of the time  Expression   Expression assist level: Expresses basic 75 - 89% of the time/requires cueing 10 - 24% of the time. Needs helper to occlude trach/needs to repeat words.  Social Interaction Social Interaction assist level: Interacts appropriately 75 - 89% of the time - Needs redirection for appropriate language or to initiate interaction.  Problem Solving Problem solving assist level: Solves basic 50 - 74% of the time/requires cueing 25 - 49% of the time  Memory Memory assist level: Recognizes or recalls 50 - 74% of the time/requires cueing 25 - 49% of the time   Short Term Goals: Week 1: SLP Short Term Goal 1 (Week 1): Patient will demonstrate functional problem solving with Min A verbal cues.  SLP Short Term Goal 2 (Week 1): Patient will demonstrate recall of new information with Min A multimodal cues.  SLP Short Term Goal 3 (Week 1): Patient will complete generative naming tasks with Min A verbal cues.  SLP Short Term Goal 4 (Week 1): Patient will verbally communicate wants/needs at the sentence level with Min A verbal cues to self-monitor and correct verbal errors.  SLP Short Term Goal 5 (Week 1): Patient will follow multi-step commnads with supervision verbal cues.   Refer to Care Plan for Long Term Goals  Recommendations for other services: Neuropsych  Discharge Criteria: Patient will be discharged from SLP if patient refuses treatment 3 consecutive times without medical reason, if treatment goals not met, if there is a  change in medical status, if patient makes no progress towards goals or if patient is discharged from hospital.  The above assessment, treatment plan, treatment alternatives and goals were discussed and mutually agreed upon: by patient  Shray Hunley 04/09/2016, 3:53 PM

## 2016-04-09 NOTE — Evaluation (Signed)
Physical Therapy Assessment and Plan  Patient Details  Name: Andrew Cuevas MRN: [id number] Date of Birth: December 14, 1945  PT Diagnosis: Abnormal posture, Abnormality of gait, Coordination disorder, Impaired cognition and Muscle weakness Rehab Potential: Good ELOS: 12-14 days   Today's Date: 04/09/2016 PT Individual Time:1015-1045 and 2992-4268 PT Individual Time Calculation (min): 30 min and 30 min  Problem List:  Patient Active Problem List   Diagnosis Date Noted  . Nontraumatic acute hemorrhage of basal ganglia ([place]) 04/08/2016  . Vitamin B 12 deficiency   . Cocaine abuse   . Right hemiparesis ([place])   . Hemorrhagic stroke ([place])   . Left hydrocele   . Tobacco abuse   . Diastolic dysfunction   . Stage 2 chronic kidney disease   . Benign essential HTN   . Chronic nonintractable headache   . History of CVA with residual deficit   . Acute blood loss anemia   . Oxygen desaturation   . Intractable hiccups   . ICH (intracerebral hemorrhage) ([place])   . Stroke (cerebrum) ([place]) 04/02/2016  . Malignant hypertension 03/28/2016  . Hypertensive heart disease without heart failure 03/28/2016    Past Medical History:  Past Medical History:  Diagnosis Date  . Chronic headaches   . CVA (cerebral vascular accident) ([place])   . HTN (hypertension), malignant    Past Surgical History:  Past Surgical History:  Procedure Laterality Date  . HERNIA REPAIR Bilateral     Assessment & Plan Clinical Impression: a 71 y.o. male with history of HTN, chronic HA, prior CVA with residual deficits who presented to ED on 04/02/16 with one month history of testicular pain and was found to have right sided weakness, soft voice and mild facial weakness. History taken from chart review and patient. BP elevated at admission 245/133 and he was started on cardene drip. Left testicular pain felt to be due to very large left sided hydrocele--no intervention needed.  CT head reviewed, showing acute left basal ganglial  hemorrhage with mild edema and atrophy.  CTA head and neck without stenosis, dissection, plaque or AVM. 2D echo with EF 65-70% with mild concentric LVH and grade 2 diastolic dysfunction. UDS positive for cocaine. BLE dopplers negative for DVT. Dr. Erlinda Hong felt that bleed due to HTN emergency and cocaine abuse. Therapy ongoing and patient noted to have poor safety with ADL tasks, problems sequencing, balance deficits with festinating gait. CIR recommended by MD and rehab team.  Patient transferred to CIR on 04/08/2016 .   Patient currently requires min with mobility secondary to muscle weakness, decreased cardiorespiratoy endurance, impaired timing and sequencing, unbalanced muscle activation and decreased coordination, decreased attention to right, decreased attention, decreased awareness, decreased problem solving, decreased safety awareness, decreased memory and delayed processing and decreased sitting balance, decreased standing balance, decreased postural control and decreased balance strategies.  Prior to hospitalization, patient was independent  with mobility and lived with Family (sister) in a House home.  Home access is 2-3Stairs to enter.  Patient will benefit from skilled PT intervention to maximize safe functional mobility, minimize fall risk and decrease caregiver burden for planned discharge home with 24 hour supervision.  Anticipate patient will benefit from follow up [place] at discharge.  PT - End of Session Activity Tolerance: Tolerates 30+ min activity with multiple rests Endurance Deficit: No PT Assessment Rehab Potential (ACUTE/IP ONLY): Good Barriers to Discharge: Inaccessible home environment;Decreased caregiver support PT Patient demonstrates impairments in the following area(s): Balance;Endurance;Motor;Perception;Safety PT Transfers Functional Problem(s): Bed Mobility;Bed to Chair;Car;Furniture;Floor PT Locomotion  Functional Problem(s): Ambulation;Stairs PT Plan PT Intensity: Minimum of  1-2 x/day ,45 to 90 minutes PT Frequency: 5 out of 7 days PT Duration Estimated Length of Stay: 12-14 days PT Treatment/Interventions: Ambulation/gait training;Community reintegration;DME/adaptive equipment instruction;Neuromuscular re-education;Psychosocial support;Stair training;UE/LE Strength taining/ROM;UE/LE Coordination activities;Therapeutic Activities;Discharge planning;Balance/vestibular training;Cognitive remediation/compensation;Disease management/prevention;Functional electrical stimulation;Functional mobility training;Patient/family education;Splinting/orthotics;Therapeutic Exercise;Visual/perceptual remediation/compensation PT Transfers Anticipated Outcome(s): mod I PT Locomotion Anticipated Outcome(s): supervision ambulatory PT Recommendation Follow Up Recommendations: Home health PT;24 hour supervision/assistance Patient destination: Home Equipment Recommended: To be determined  Skilled Therapeutic Intervention  Session 1: focus on initiation of PT evaluation, education regarding PT role, therapy plan of care, ELOS, and expected outcomes, transfer training, and w/c seating/positioning.  Pt completed bed mobility and transfers at overall min assist level, short distance gait within room with min assist and RW. PT provided pt with 18x18 w/c with cushion for improved sitting tolerance and pt left upright in w/c at end of session with call bell in reach awaiting OT.    Session 2: no c/o pain.  Focus on gait training, activity tolerance, and stair negotiation.  Pt demonstrates bed mobility with supervision using bed rails and min cues to untangle LEs from sheets.  Sit<>stand throughout session with steady assist and min cues for hand placement.  Gait training x160' + 100' with RW and min guard assist with verbal cues for upright posture, increased step length, and walker positioning.  Stair negotiation x12 steps with 2 rails, min assist, and min cues for posture and sequencing.  Pt returned  to room in w/c at end of session and performed supervision squat/pivot back to bed, sit>supine supervision.  Pt positioned to comfort with call bell in reach and needs met.   PT Evaluation Precautions/Restrictions Precautions Precautions: Fall Restrictions Weight Bearing Restrictions: No Pain Pain Assessment Pain Assessment: No/denies pain Home Living/Prior Functioning Home Living Living Arrangements: Other relatives Available Help at Discharge: Family;Available 24 hours/day Type of Home: House Home Access: Stairs to enter [employer] of Steps: 2-3 Entrance Stairs-Rails: None Home Layout: One level Bathroom Shower/Tub: Chiropodist: Standard Additional Comments: Per report from pt during eval, home is level entry however pre-admission states 3 steps +3 steps and when asked to clarify pt states 2 steps with no rail.  Will benefit from clarification from sister.  Lives With: Family (sister) Prior Function Level of Independence: Independent with gait;Independent with transfers  Able to Take Stairs?: Yes Driving: No Vocation: Retired (worked in [employer]) Comments: pt with difficulty answering questions, so may need to clarify PLOF with family.   Vision/Perception  Vision - Assessment Eye Alignment: Within Functional Limits Ocular Range of Motion: Within Functional Limits Alignment/Gaze Preference: Within Defined Limits Tracking/Visual Pursuits: Able to track stimulus in all quads without difficulty Saccades: Within functional limits Perception Comments: appears WFL Praxis Praxis-Other Comments: appears WFL, to be further assessed throughout LOS  Cognition Overall Cognitive Status: Impaired/Different from baseline Arousal/Alertness: Awake/alert Orientation Level: Oriented X4 Attention: Selective Sustained Attention: Impaired Selective Attention: Impaired Memory: Impaired Memory Impairment: Decreased short term memory;Decreased  recall of new information Awareness: Impaired Problem Solving: Appears intact Safety/Judgment: Appears intact Sensation Sensation Light Touch: Appears Intact Hot/Cold: Appears Intact Proprioception: Appears Intact Coordination Gross Motor Movements are Fluid and Coordinated: No Fine Motor Movements are Fluid and Coordinated: No Coordination and Movement Description: mild decrease in coordination on R Finger Nose Finger Test: WNL Motor  Motor Motor: Abnormal postural alignment and control;Other (comment) Motor - Skilled Clinical Observations: mild R hemiparesis Ue>Le  Mobility Bed Mobility Bed Mobility: Supine to Sit;Sit to Supine Supine to Sit: 5: Supervision Sit to Supine: 5: Supervision Transfers Transfers: Yes Sit to Stand: 4: Min guard Stand to Sit: 4: Min guard Stand Pivot Transfers: 4: Min guard Locomotion  Ambulation Ambulation: Yes Ambulation/Gait Assistance: 4: Min guard Ambulation Distance (Feet): 160 Feet Assistive device: Rolling walker Gait Gait: Yes Gait Pattern: Trunk flexed;Shuffle Gait velocity: slow Stairs / Additional Locomotion Stairs: Yes Stairs Assistance: 4: Min assist Stairs Assistance Details: Tactile cues for sequencing;Tactile cues for posture Stair Management Technique: Two rails Number of Stairs: 12 Wheelchair Mobility Wheelchair Mobility: No  Trunk/Postural Assessment  Cervical Assessment Cervical Assessment: Within Functional Limits Thoracic Assessment Thoracic Assessment: Within Functional Limits Lumbar Assessment Lumbar Assessment: Within Functional Limits Postural Control Postural Control: Within Functional Limits  Balance Balance Balance Assessed: Yes Static Standing Balance Static Standing - Balance Support: During functional activity Static Standing - Level of Assistance: 5: Stand by assistance;4: Min assist Extremity Assessment  RUE Assessment RUE Assessment: Exceptions to [facility] RUE AROM (degrees) Overall AROM Right  Upper Extremity: Deficits RUE Overall AROM Comments: shoulder flexion limited to 120 degrees, however able to use functionally with all ADLs RUE Strength RUE Overall Strength: Deficits RUE Overall Strength Comments: grossly 4/5, loose gross grasp LUE Assessment LUE Assessment: Within Functional Limits (strength grossly 4+/5, loose gross grasp) RLE Assessment RLE Assessment: Within Functional Limits (4+/5 throughout) LLE Assessment LLE Assessment: Within Functional Limits (4+/5 throughout)   See Function Navigator for Current Functional Status.   Refer to Care Plan for Long Term Goals  Recommendations for other services: None   Discharge Criteria: Patient will be discharged from PT if patient refuses treatment 3 consecutive times without medical reason, if treatment goals not met, if there is a change in medical status, if patient makes no progress towards goals or if patient is discharged from hospital.  The above assessment, treatment plan, treatment alternatives and goals were discussed and mutually agreed upon: by patient  Urban Gibson E Penven-Crew 04/09/2016, 3:02 PM

## 2016-04-09 NOTE — Progress Notes (Signed)
Subjective/Complaints: Patient states he slept well last night. He cannot tell me how the stroke affected him.  Review of systems negative for chest pain, shortness of breath, nausea, vomiting, bowel or bladder issues. No pain issues  Objective: Vital Signs: Blood pressure (!) 145/87, pulse (!) 54, temperature 97.6 F (36.4 C), temperature source Oral, resp. rate 16, height 5' 10"  (1.778 m), weight 100.6 kg (221 lb 12.8 oz), SpO2 92 %. No results found. Results for orders placed or performed during the hospital encounter of 04/08/16 (from the past 72 hour(s))  Creatinine, serum     Status: Abnormal   Collection Time: 04/08/16  3:56 PM  Result Value Ref Range   Creatinine, Ser 1.33 (H) 0.61 - 1.24 mg/dL   GFR calc non Af Amer 52 (L) >60 mL/min   GFR calc Af Amer >60 >60 mL/min    Comment: (NOTE) The eGFR has been calculated using the CKD EPI equation. This calculation has not been validated in all clinical situations. eGFR's persistently <60 mL/min signify possible Chronic Kidney Disease.   CBC WITH DIFFERENTIAL     Status: Abnormal   Collection Time: 04/09/16  4:49 AM  Result Value Ref Range   WBC 3.9 (L) 4.0 - 10.5 K/uL   RBC 4.21 (L) 4.22 - 5.81 MIL/uL   Hemoglobin 10.5 (L) 13.0 - 17.0 g/dL   HCT 33.4 (L) 39.0 - 52.0 %   MCV 79.3 78.0 - 100.0 fL   MCH 24.9 (L) 26.0 - 34.0 pg   MCHC 31.4 30.0 - 36.0 g/dL   RDW 16.0 (H) 11.5 - 15.5 %   Platelets 232 150 - 400 K/uL   Neutrophils Relative % 25 %   Lymphocytes Relative 58 %   Monocytes Relative 12 %   Eosinophils Relative 4 %   Basophils Relative 1 %   Neutro Abs 1.0 (L) 1.7 - 7.7 K/uL   Lymphs Abs 2.2 0.7 - 4.0 K/uL   Monocytes Absolute 0.5 0.1 - 1.0 K/uL   Eosinophils Absolute 0.2 0.0 - 0.7 K/uL   Basophils Absolute 0.0 0.0 - 0.1 K/uL   RBC Morphology TEARDROP CELLS   Comprehensive metabolic panel     Status: Abnormal   Collection Time: 04/09/16  4:49 AM  Result Value Ref Range   Sodium 136 135 - 145 mmol/L   Potassium 4.1 3.5 - 5.1 mmol/L   Chloride 104 101 - 111 mmol/L   CO2 22 22 - 32 mmol/L   Glucose, Bld 92 65 - 99 mg/dL   BUN 13 6 - 20 mg/dL   Creatinine, Ser 1.29 (H) 0.61 - 1.24 mg/dL   Calcium 9.1 8.9 - 10.3 mg/dL   Total Protein 6.9 6.5 - 8.1 g/dL   Albumin 3.3 (L) 3.5 - 5.0 g/dL   AST 21 15 - 41 U/L   ALT 15 (L) 17 - 63 U/L   Alkaline Phosphatase 53 38 - 126 U/L   Total Bilirubin 0.6 0.3 - 1.2 mg/dL   GFR calc non Af Amer 54 (L) >60 mL/min   GFR calc Af Amer >60 >60 mL/min    Comment: (NOTE) The eGFR has been calculated using the CKD EPI equation. This calculation has not been validated in all clinical situations. eGFR's persistently <60 mL/min signify possible Chronic Kidney Disease.    Anion gap 10 5 - 15      General: No acute distress Mood and affect are appropriate Heart: Regular rate and rhythm no rubs murmurs or extra sounds Lungs: Clear to  auscultation, breathing unlabored, no rales or wheezes Abdomen: Positive bowel sounds, soft nontender to palpation, nondistended Extremities: No clubbing, cyanosis, or edema Skin: No evidence of breakdown, no evidence of rash Neurologic: Cranial nerves II through XII intact, motor strength is 5/5 in left deltoid, bicep, tricep, grip, hip flexor, knee extensors, ankle dorsiflexor and plantar flexor 4/5 right side Sensory exam normal sensation to light touch and proprioception in bilateral upper and lower extremities Cerebellar exam , dysmetria , right finger to nose to finger as well as heel to shin in upper and lower extremities, no evidence of dysmetria on the left side. Musculoskeletal: Full range of motion in all 4 extremities. No joint swelling Oriented to person, hospital, but not name of hospital. Oriented to time  Assessment/Plan: 1. Functional deficits secondary to acute Left Basal ganglia ICH which require 3+ hours per day of interdisciplinary therapy in a comprehensive inpatient rehab setting. Physiatrist is providing  close team supervision and 24 hour management of active medical problems listed below. Physiatrist and rehab team continue to assess barriers to discharge/monitor patient progress toward functional and medical goals. FIM:       Function - Toileting Toileting steps completed by patient: Performs perineal hygiene, Adjust clothing after toileting Toileting steps completed by helper: Adjust clothing prior to toileting           Function - Comprehension Comprehension: Auditory  Function - Expression Expression: Verbal Expression assist level: Expresses complex ideas: With extra time/assistive device  Function - Social Interaction Social Interaction assist level: Interacts appropriately with others - No medications needed.     Function - Memory Memory assist level: More than reasonable amount of time Patient normally able to recall (first 3 days only): That he or she is in a hospital, Location of own room, Current season  Medical Problem List and Plan: 1.  Poor safety awareness, limitations with self-care, balance deficits secondary to left basal ganglial hemorrhage. Initiate CIR PT, OT, SLP, suspect orientation issue may be related to word finding him a speech to eval 2.  DVT Prophylaxis/Anticoagulation: Pharmaceutical: Lovenox Plt nl 232K will monitor 3. Pain Management: N/A 4. Mood: LCSW to follow for evaluation and support 5. Neuropsych: This patient is not fully capable of making decisions on his own behalf. 6. Skin/Wound Care: Maintain adequate nutritional and hydration status. Routine pressure relief measures.   7. Fluids/Electrolytes/Nutrition: Monitor I/O. Check lytes in am.  8. HTN: Monitor BP bid-continue catapres and lisinopril. No dosage change Vitals:   04/08/16 2034 04/09/16 0528  BP: (!) 150/77 (!) 145/87  Pulse:  (!) 54  Resp:  16  Temp:  97.6 F (36.4 C)   9. Tobacco/ cocaine abuse: continue to educate on importance of cessation.  10. Large left sided  hydrocele: follow up with urology after discharge.  11. B 12 deficiency with anemia:Question anemia of chronic disease-- B 12 level- 136. Will supplement. Also add iron and monitor.  12. CKD: Baseline SCr -1.3. Continue to monitor with serial checks. 2/8 lab at baseline 13.  Anemia- ~.5gm drop since admit, likely  acute blood loss but may have some chronic anemia as well LOS (Days) 1 A FACE TO FACE EVALUATION WAS PERFORMED  Jacqualyn Sedgwick E 04/09/2016, 8:23 AM

## 2016-04-09 NOTE — Evaluation (Signed)
Occupational Therapy Assessment and Plan  Patient Details  Name: Ruffin Lada MRN: [id number] Date of Birth: Mar 14, 1945  OT Diagnosis: cognitive deficits, hemiplegia affecting non-dominant side, muscle weakness (generalized) and visual/perceptual impairments Rehab Potential: Rehab Potential (ACUTE ONLY): Good ELOS: 10-14 days   Today's Date: 04/09/2016 OT Individual Time: 1045-1200 OT Individual Time Calculation (min): 75 min     Problem List:  Patient Active Problem List   Diagnosis Date Noted  . Nontraumatic acute hemorrhage of basal ganglia ([place]) 04/08/2016  . Vitamin B 12 deficiency   . Cocaine abuse   . Right hemiparesis ([place])   . Hemorrhagic stroke ([place])   . Left hydrocele   . Tobacco abuse   . Diastolic dysfunction   . Stage 2 chronic kidney disease   . Benign essential HTN   . Chronic nonintractable headache   . History of CVA with residual deficit   . Acute blood loss anemia   . Oxygen desaturation   . Intractable hiccups   . ICH (intracerebral hemorrhage) ([place])   . Stroke (cerebrum) ([place]) 04/02/2016  . Malignant hypertension 03/28/2016  . Hypertensive heart disease without heart failure 03/28/2016    Past Medical History:  Past Medical History:  Diagnosis Date  . Chronic headaches   . CVA (cerebral vascular accident) ([place])   . HTN (hypertension), malignant    Past Surgical History:  Past Surgical History:  Procedure Laterality Date  . HERNIA REPAIR Bilateral     Assessment & Plan Clinical Impression: Patient is a 71 y.o. year old male with history of HTN, chronic HA, prior CVA without significant residual deficits who presented to ED on 04/02/16 with one month history of testicular pain and was found to have right sided weakness, soft voice and mild facial weakness. History taken from chart review and patient. BP elevated at admission 245/133 and he was started on cardene drip. Left testicular pain felt to be due to very large left sided hydrocele--no  intervention needed. CT head reviewed, showing acute left basal ganglial hemorrhage with mild edema and atrophy. CTA head and neck without stenosis, dissection, plaque or AVM. 2D echo with EF 65-70% with mild concentric LVH and grade 2 diastolic dysfunction. UDS positive for cocaine. BLE dopplers negative for DVT. Dr. Erlinda Hong felt that bleed due to HTN emergency and cocaine abuse. Therapy ongoing and patient noted to have poor safety with ADL tasks, problems sequencing, balance deficits with festinating gait. CIR recommended by MD and rehab team.    Patient transferred to CIR on 04/08/2016 .    Patient currently requires min with basic self-care skills secondary to muscle weakness, impaired timing and sequencing and decreased coordination, decreased attention to right and ideational apraxia, decreased attention, decreased awareness, decreased problem solving, decreased safety awareness and decreased memory and decreased standing balance and decreased balance strategies.  Prior to hospitalization, patient could complete ADLs with independent .  Patient will benefit from skilled intervention to increase independence with basic self-care skills prior to discharge home with care partner.  Anticipate patient will require 24 hour supervision and follow up home health.  OT - End of Session Activity Tolerance: Tolerates 30+ min activity without fatigue Endurance Deficit: No OT Assessment Rehab Potential (ACUTE ONLY): Good OT Patient demonstrates impairments in the following area(s): Balance;Cognition;Endurance;Motor;Safety OT Basic ADL's Functional Problem(s): Grooming;Bathing;Dressing;Toileting OT Transfers Functional Problem(s): Toilet;Tub/Shower OT Additional Impairment(s): Fuctional Use of Upper Extremity OT Plan OT Intensity: Minimum of 1-2 x/day, 45 to 90 minutes OT Frequency: 5 out of 7 days OT  Duration/Estimated Length of Stay: 10-14 days OT Treatment/Interventions: Balance/vestibular  training;Cognitive remediation/compensation;Community reintegration;Discharge planning;Disease mangement/prevention;DME/adaptive equipment instruction;Functional mobility training;Pain management;Patient/family education;Psychosocial support;Neuromuscular re-education;Self Care/advanced ADL retraining;Therapeutic Activities;Therapeutic Exercise;UE/LE Strength taining/ROM;UE/LE Coordination activities;Visual/perceptual remediation/compensation OT Self Feeding Anticipated Outcome(s): Mod I OT Basic Self-Care Anticipated Outcome(s): Supervision/setup OT Toileting Anticipated Outcome(s): Supervision/setup OT Bathroom Transfers Anticipated Outcome(s): Supervision/setup OT Recommendation Recommendations for Other Services: Therapeutic Recreation consult Therapeutic Recreation Interventions: Pet therapy;Stress management Patient destination: Home Follow Up Recommendations: Home health OT;Outpatient OT;24 hour supervision/assistance Equipment Recommended: Tub/shower bench;To be determined   Skilled Therapeutic Intervention OT eval completed with discussion of rehab process, OT purpose, POC, ELOS, and goals.  ADL assessment completed with bathroom transfers and bathing at shower level all with min assist with use of RW for mobility.  Grooming completed in standing with min/steady assist for standing balance.  Noted mild ideational apraxia, difficulty terminating tasks, and Rt inattention during self-care tasks.   OT Evaluation Precautions/Restrictions  Precautions Precautions: Fall Restrictions Weight Bearing Restrictions: No Pain Pain Assessment Pain Assessment: No/denies pain Home Living/Prior Functioning Home Living Family/patient expects to be discharged to:: Private residence Living Arrangements: Other relatives Available Help at Discharge: Family, Available 24 hours/day Type of Home: House Home Access: Level entry Home Layout: One level Bathroom Shower/Tub: Print production planner: Standard Additional Comments: Per report from pt during eval, home is level entry however pre-admission states 3 steps +3 steps.  Will benefit from clarification from sister.  Lives With: Family (sister) Prior Function Level of Independence: Independent with basic ADLs, Independent with gait Driving: No Vocation: Retired (worked in the [employer]) ADL  See Function Navigator Vision/Perception  Vision- History Baseline Vision/History: Wears glasses Wears Glasses: At all times Patient Visual Report: No change from baseline Vision- Assessment Vision Assessment?: No apparent visual deficits;Yes Eye Alignment: Within Functional Limits Ocular Range of Motion: Within Functional Limits Alignment/Gaze Preference: Within Defined Limits Tracking/Visual Pursuits: Able to track stimulus in all quads without difficulty Saccades: Within functional limits Visual Fields: No apparent deficits Depth Perception:  [facility]) Perception Comments: appears WFL  Cognition Arousal/Alertness: Awake/alert Orientation Level: Person;Place;Situation Person: Oriented Place: Oriented Situation: Oriented Year: 2018 Month: February Day of Week: Correct Memory: Impaired Memory Impairment: Decreased short term memory;Decreased recall of new information Immediate Memory Recall: Sock;Blue;Bed Memory Recall: Blue Memory Recall Blue: With Cue Attention: Selective Selective Attention: Impaired Problem Solving: Appears intact Safety/Judgment: Appears intact Sensation Sensation Light Touch: Appears Intact Hot/Cold: Appears Intact Proprioception: Appears Intact Coordination Gross Motor Movements are Fluid and Coordinated: No Fine Motor Movements are Fluid and Coordinated: No Coordination and Movement Description: very mild decreased coordination on Rt Finger Nose Finger Test: WNL Extremity/Trunk Assessment RUE Assessment RUE Assessment: Exceptions to [facility] RUE AROM (degrees) Overall AROM Right  Upper Extremity: Deficits RUE Overall AROM Comments: shoulder flexion limited to 120 degrees, however able to use functionally with all ADLs RUE Strength RUE Overall Strength: Deficits RUE Overall Strength Comments: grossly 4/5, loose gross grasp LUE Assessment LUE Assessment: Within Functional Limits (strength grossly 4+/5, loose gross grasp)   See Function Navigator for Current Functional Status.   Refer to Care Plan for Long Term Goals  Recommendations for other services: Therapeutic Recreation  Pet therapy and Stress management   Discharge Criteria: Patient will be discharged from OT if patient refuses treatment 3 consecutive times without medical reason, if treatment goals not met, if there is a change in medical status, if patient makes no progress towards goals or if patient is discharged from hospital.  The above  assessment, treatment plan, treatment alternatives and goals were discussed and mutually agreed upon: by patient  Ellwood Dense [facility] 04/09/2016, 11:49 AM

## 2016-04-09 NOTE — Progress Notes (Signed)
Social Work Assessment and Plan Social Work Assessment and Plan  Patient Details  Name: Andrew Cuevas MRN: [id number] Date of Birth: Feb 05, 1946  Today's Date: 04/09/2016  Problem List:  Patient Active Problem List   Diagnosis Date Noted  . Nontraumatic acute hemorrhage of basal ganglia ([facility]) 04/08/2016  . Vitamin B 12 deficiency   . Cocaine abuse   . Right hemiparesis ([facility])   . Hemorrhagic stroke ([facility])   . Left hydrocele   . Tobacco abuse   . Diastolic dysfunction   . Stage 2 chronic kidney disease   . Benign essential HTN   . Chronic nonintractable headache   . History of CVA with residual deficit   . Acute blood loss anemia   . Oxygen desaturation   . Intractable hiccups   . ICH (intracerebral hemorrhage) ([facility])   . Stroke (cerebrum) ([facility]) 04/02/2016  . Malignant hypertension 03/28/2016  . Hypertensive heart disease without heart failure 03/28/2016   Past Medical History:  Past Medical History:  Diagnosis Date  . Chronic headaches   . CVA (cerebral vascular accident) ([facility])   . HTN (hypertension), malignant    Past Surgical History:  Past Surgical History:  Procedure Laterality Date  . HERNIA REPAIR Bilateral    Social History:  reports that he has been smoking Cigarettes.  He has been smoking about 0.50 packs per day. He has never used smokeless tobacco. He reports that he does not drink alcohol or use drugs.  Family / Support Systems Marital Status: Widow/Widower How Long?: July 2017 Patient Roles: Other (Comment) (Sibling and retiree) Children: Step daughter in [place] Other Supports: [name]-sister  [phone]  Gerardus-brother  [phone] Anticipated Caregiver: Alona BeneJoyce Ability/Limitations of Caregiver: Sister is retired and can assist with pt at discharge Caregiver Availability: 24/7 Family Dynamics: Pt recently came down to [place] the last two months, he was up in [place]. His wife recently died and he is greiving her death. Sister reports she doesn't aks him much  cause doesn't want to upset him. His sister, brother and he has a son here also.  Social History Preferred language: English Religion: None Cultural Background: No issues Education: High School Read: Yes Write: Yes Employment Status: Retired Fish farm managerLegal Hisotry/Current Legal Issues: No issues Guardian/Conservator: none-according to MD pt is not fully capable of making his own decisions while here, will look toward his sister or brother, due to not close with son who does live here, hasn't seen since got her two months ago   Abuse/Neglect Physical Abuse: Denies Verbal Abuse: Denies Sexual Abuse: Denies Exploitation of patient/patient's resources: Denies Self-Neglect: Denies  Emotional Status Pt's affect, behavior adn adjustment status: Pt is motivated but quite flat from his stroke. He had difficulty answering my questions so contacted sister with his permission. He did say he was independent and took care of himself before this and wants to again. The am therapies tired him out. Recent Psychosocial Issues: recent loss of wife and move to [place] from [place] Pyschiatric History: No history according to pt. Difficult to assess due to speech issues but is participating in therapies and will monitor along with team and made neuro-psych referral Substance Abuse History: When asked about his drug history or usuage he denied he had any, even when discussed the drug screen on admission he denied this. Will ask neuro-psych to see while here and also address along with grief issues from the loss of his wife.  Patient / Family Perceptions, Expectations & Goals Pt/Family understanding of illness & functional limitations: Unsure  if pt realizes the reason he is here, sister has spoken with the MD and feels she has a basic understanding of his stroke and deficits. She is hopeful he will do well here before coming home.  Premorbid pt/family roles/activities: Brother, father, retiree, uncle, etc Anticipated changes in  roles/activities/participation: resume Pt/family expectations/goals: Sister states: " I hope he will do well I can only do so much for him I am old also."  Pt states: " We will see."  [employer]: None Premorbid Home Care/DME Agencies: None Transportation available at discharge: Siblings Resource referrals recommended: Neuropsychology, Support group (specify)  Discharge Planning Living Arrangements: Other relatives Support Systems: Children, Other relatives Type of Residence: Private residence Insurance Resources: Media planner (specify) Investment banker, operational) Financial Resources: Tree surgeon, Family Support Financial Screen Referred: Yes Living Expenses: Lives with family Money Management: Patient Does the patient have any problems obtaining your medications?: No Home Management: Sister does the home management at her home where pt has been staying Patient/Family Preliminary Plans: Return home with sister who can provide supervision level, hopes he will do well here. Discussed rehab process and team conference on Wed. Will continue to try to assess pt and his coping as he improves with his speech. Social Work Anticipated Follow Up Needs: HH/OP, Support Group  Clinical Impression Quiet gentleman who sister reports is a man of few words. He recently loss his wife in July and came down to [place] to be close to his siblings, he has a son here which he has not seen since he got here. He has been living with his sister and the plan is for him to return home with her. She reports she can assist with his care as long as it is not much physical care. Sister voiced she tries not to ask him too many questions, so allows him to bring up issues. Will ask neuro-psych to see him and discuss substance abuse issues and grief over the recent loss of his wife. Will continue to assess pt's coping and work on a realistic discharge plan.  Lucy Chris 04/09/2016, 2:26 PM

## 2016-04-09 NOTE — Care Management Note (Signed)
Inpatient Rehabilitation Center Individual Statement of Services  Patient Name:  Carlis AbbottJohn Coston  Date:  04/09/2016  Welcome to the Inpatient Rehabilitation Center.  Our goal is to provide you with an individualized program based on your diagnosis and situation, designed to meet your specific needs.  With this comprehensive rehabilitation program, you will be expected to participate in at least 3 hours of rehabilitation therapies Monday-Friday, with modified therapy programming on the weekends.  Your rehabilitation program will include the following services:  Physical Therapy (PT), Occupational Therapy (OT), Speech Therapy (ST), 24 hour per day rehabilitation nursing, Therapeutic Recreaction (TR), Neuropsychology, Case Management (Social Worker), Rehabilitation Medicine, Nutrition Services and Pharmacy Services  Weekly team conferences will be held on Wednesday to discuss your progress.  Your Social Worker will talk with you frequently to get your input and to update you on team discussions.  Team conferences with you and your family in attendance may also be held.  Expected length of stay: 12-14 days Overall anticipated outcome: supervision with cueing and set-up  Depending on your progress and recovery, your program may change. Your Social Worker will coordinate services and will keep you informed of any changes. Your Social Worker's name and contact numbers are listed  below.  The following services may also be recommended but are not provided by the Inpatient Rehabilitation Center:   Driving Evaluations  Home Health Rehabiltiation Services  Outpatient Rehabilitation Services    Arrangements will be made to provide these services after discharge if needed.  Arrangements include referral to agencies that provide these services.  Your insurance has been verified to be:  [employer]-Medicare Your primary doctor is:  Arva Chafeicholas Wendling  Pertinent information will be shared with your doctor and your  insurance company.  Social Worker:  Dossie DerBecky Analysse Quinonez, [phone] or [phone]  Information discussed with and copy given to patient by: Lucy Chrisupree, Veron Senner G, 04/09/2016, 8:58 AM

## 2016-04-10 ENCOUNTER — Inpatient Hospital Stay (HOSPITAL_COMMUNITY): Payer: Medicare Other | Admitting: Occupational Therapy

## 2016-04-10 ENCOUNTER — Inpatient Hospital Stay (HOSPITAL_COMMUNITY): Payer: Medicare Other | Admitting: Physical Therapy

## 2016-04-10 ENCOUNTER — Inpatient Hospital Stay (HOSPITAL_COMMUNITY): Payer: Medicare Other | Admitting: Speech Pathology

## 2016-04-10 ENCOUNTER — Inpatient Hospital Stay (HOSPITAL_COMMUNITY): Payer: Medicare Other

## 2016-04-10 DIAGNOSIS — R05 Cough: Secondary | ICD-10-CM

## 2016-04-10 LAB — PATHOLOGIST SMEAR REVIEW

## 2016-04-10 NOTE — Progress Notes (Signed)
Physical Therapy Session Note  Patient Details  Name: Andrew Cuevas MRN: [id number] Date of Birth: August 26, 1945  Today's Date: 04/10/2016 PT Individual Time: 1300-1420 PT Individual Time Calculation (min): 80 min   Short Term Goals: Week 1:  PT Short Term Goal 1 (Week 1): pt will ambulate 200' with LRAD and min guard PT Short Term Goal 2 (Week 1): Pt will transfer with supervision and LRAD  Skilled Therapeutic Interventions/Progress Updates:    no c/o pain and agreeable to therapy session.  Session focus on activity tolerance, transfers, gait training with RW, NMR, and balance.    Pt performs sit<>stand transfers throughout with supervision and occasional cues for hand placement.  Car transfer with steady assist and mod cues for sequencing.  Gait training throughout unit with RW in controlled/open environment, up/down incline, and on compliant surfaces with supervision to min guard assist with cues for walker positioning and upright posture.    PT administered BERG Balance Scale and patient demonstrates significant fall risk as noted by score of 37/56 on Berg Balance Scale.  PT interpreted results of BERG for pt and explained recommendations for use of RW and assistance from staff with all mobility for safety.  Pt verbalized understanding and education to continue throughout LOS.   NMR via hip abd/add with resistance for improved postural control and balance during gait 2x10 reps each.  Nustep x10 minutes with 4 extremities at level 3 for forced use, reciprocal stepping pattern retraining, and cardiovascular endurance with min cues for attention to stepping speed, ROM, and time.    Pt returned to room at end of session and positioned EOB, incontinent of urine at EOB and requiring increased time and cues to change into clean clothes and ambulation to recliner.  Pt positioned in recliner with call bell in reach and needs met.     Therapy Documentation Precautions:  Precautions Precautions:  Fall Restrictions Weight Bearing Restrictions: No Balance: Balance Balance Assessed: Yes Standardized Balance Assessment Standardized Balance Assessment: Berg Balance Test Berg Balance Test Sit to Stand: Able to stand without using hands and stabilize independently Standing Unsupported: Able to stand 2 minutes with supervision Sitting with Back Unsupported but Feet Supported on Floor or Stool: Able to sit safely and securely 2 minutes Stand to Sit: Controls descent by using hands Transfers: Able to transfer safely, definite need of hands Standing Unsupported with Eyes Closed: Able to stand 10 seconds with supervision Standing Ubsupported with Feet Together: Able to place feet together independently but unable to hold for 30 seconds From Standing, Reach Forward with Outstretched Arm: Can reach forward >5 cm safely (2") From Standing Position, Pick up Object from Floor: Able to pick up shoe, needs supervision From Standing Position, Turn to Look Behind Over each Shoulder: Looks behind from both sides and weight shifts well Turn 360 Degrees: Able to turn 360 degrees safely but slowly Standing Unsupported, Alternately Place Feet on Step/Stool: Able to complete >2 steps/needs minimal assist Standing Unsupported, One Foot in Front: Able to take small step independently and hold 30 seconds Standing on One Leg: Tries to lift leg/unable to hold 3 seconds but remains standing independently Total Score: 37   See Function Navigator for Current Functional Status.   Therapy/Group: Individual Therapy  [name] 04/10/2016, 1:55 PM

## 2016-04-10 NOTE — Progress Notes (Signed)
Subjective/Complaints: Patient states he slept well last night. He cannot tell me how the stroke affected him.  Review of systems negative for chest pain, shortness of breath, nausea, vomiting, bowel or bladder issues. No pain issues  Objective: Vital Signs: Blood pressure 140/80, pulse 84, temperature 98.2 F (36.8 C), temperature source Oral, resp. rate 17, height 5' 10" (1.778 m), weight 100.6 kg (221 lb 12.8 oz), SpO2 97 %. No results found. Results for orders placed or performed during the hospital encounter of 04/08/16 (from the past 72 hour(s))  Creatinine, serum     Status: Abnormal   Collection Time: 04/08/16  3:56 PM  Result Value Ref Range   Creatinine, Ser 1.33 (H) 0.61 - 1.24 mg/dL   GFR calc non Af Amer 52 (L) >60 mL/min   GFR calc Af Amer >60 >60 mL/min    Comment: (NOTE) The eGFR has been calculated using the CKD EPI equation. This calculation has not been validated in all clinical situations. eGFR's persistently <60 mL/min signify possible Chronic Kidney Disease.   CBC WITH DIFFERENTIAL     Status: Abnormal   Collection Time: 04/09/16  4:49 AM  Result Value Ref Range   WBC 3.9 (L) 4.0 - 10.5 K/uL   RBC 4.21 (L) 4.22 - 5.81 MIL/uL   Hemoglobin 10.5 (L) 13.0 - 17.0 g/dL   HCT 33.4 (L) 39.0 - 52.0 %   MCV 79.3 78.0 - 100.0 fL   MCH 24.9 (L) 26.0 - 34.0 pg   MCHC 31.4 30.0 - 36.0 g/dL   RDW 16.0 (H) 11.5 - 15.5 %   Platelets 232 150 - 400 K/uL   Neutrophils Relative % 25 %   Lymphocytes Relative 58 %   Monocytes Relative 12 %   Eosinophils Relative 4 %   Basophils Relative 1 %   Neutro Abs 1.0 (L) 1.7 - 7.7 K/uL   Lymphs Abs 2.2 0.7 - 4.0 K/uL   Monocytes Absolute 0.5 0.1 - 1.0 K/uL   Eosinophils Absolute 0.2 0.0 - 0.7 K/uL   Basophils Absolute 0.0 0.0 - 0.1 K/uL   RBC Morphology TEARDROP CELLS   Comprehensive metabolic panel     Status: Abnormal   Collection Time: 04/09/16  4:49 AM  Result Value Ref Range   Sodium 136 135 - 145 mmol/L   Potassium 4.1  3.5 - 5.1 mmol/L   Chloride 104 101 - 111 mmol/L   CO2 22 22 - 32 mmol/L   Glucose, Bld 92 65 - 99 mg/dL   BUN 13 6 - 20 mg/dL   Creatinine, Ser 1.29 (H) 0.61 - 1.24 mg/dL   Calcium 9.1 8.9 - 10.3 mg/dL   Total Protein 6.9 6.5 - 8.1 g/dL   Albumin 3.3 (L) 3.5 - 5.0 g/dL   AST 21 15 - 41 U/L   ALT 15 (L) 17 - 63 U/L   Alkaline Phosphatase 53 38 - 126 U/L   Total Bilirubin 0.6 0.3 - 1.2 mg/dL   GFR calc non Af Amer 54 (L) >60 mL/min   GFR calc Af Amer >60 >60 mL/min    Comment: (NOTE) The eGFR has been calculated using the CKD EPI equation. This calculation has not been validated in all clinical situations. eGFR's persistently <60 mL/min signify possible Chronic Kidney Disease.    Anion gap 10 5 - 15  Pathologist smear review     Status: None   Collection Time: 04/09/16  4:49 AM  Result Value Ref Range   Path Review Neutropenia.  Comment: Anemia. Reviewed by Casimer Lanius, MD 04/09/2016       General: No acute distress Mood and affect are appropriate Heart: Regular rate and rhythm no rubs murmurs or extra sounds Lungs: Clear to auscultation, breathing unlabored, no rales or wheezes Abdomen: Positive bowel sounds, soft nontender to palpation, nondistended Extremities: No clubbing, cyanosis, or edema Skin: No evidence of breakdown, no evidence of rash Neurologic: Cranial nerves II through XII intact, motor strength is 5/5 in left deltoid, bicep, tricep, grip, hip flexor, knee extensors, ankle dorsiflexor and plantar flexor 4/5 right side Sensory exam normal sensation to light touch and proprioception in bilateral upper and lower extremities Cerebellar exam , dysmetria , right finger to nose to finger as well as heel to shin in upper and lower extremities, no evidence of dysmetria on the left side. Musculoskeletal: Full range of motion in all 4 extremities. No joint swelling Oriented to person, hospital, but not name of hospital. Oriented to time  Assessment/Plan: 1.  Functional deficits secondary to acute Left Basal ganglia ICH which require 3+ hours per day of interdisciplinary therapy in a comprehensive inpatient rehab setting. Physiatrist is providing close team supervision and 24 hour management of active medical problems listed below. Physiatrist and rehab team continue to assess barriers to discharge/monitor patient progress toward functional and medical goals. FIM: Function - Bathing Position: Shower Body parts bathed by patient: Right arm, Left arm, Chest, Abdomen, Front perineal area, Right upper leg, Left upper leg, Right lower leg, Left lower leg Body parts bathed by helper: Buttocks, Back Assist Level: Touching or steadying assistance(Pt > 75%)  Function- Upper Body Dressing/Undressing What is the patient wearing?: Hospital gown Assist Level: Set up Function - Lower Body Dressing/Undressing What is the patient wearing?: Hospital Gown, Non-skid slipper socks Position: Wheelchair/chair at sink Non-skid slipper socks- Performed by patient: Don/doff right sock, Don/doff left sock Assist for footwear: Setup Assist for lower body dressing: Set up Set up : To obtain clothing/put away  Function - Toileting Toileting steps completed by patient: Performs perineal hygiene, Adjust clothing after toileting Toileting steps completed by helper: Adjust clothing prior to toileting Toileting Assistive Devices: Grab bar or rail Assist level: Touching or steadying assistance (Pt.75%)  Function - Toilet Transfers Toilet transfer assistive device: Grab bar, Walker Assist level to toilet: Touching or steadying assistance (Pt > 75%) Assist level from toilet: Touching or steadying assistance (Pt > 75%)  Function - Chair/bed transfer Chair/bed transfer method: Squat pivot, Ambulatory Chair/bed transfer assist level: Touching or steadying assistance (Pt > 75%) Chair/bed transfer assistive device: Armrests, Walker Chair/bed transfer details: Verbal cues for  technique, Verbal cues for precautions/safety, Tactile cues for placement  Function - Locomotion: Wheelchair Will patient use wheelchair at discharge?: No Wheelchair activity did not occur: N/A Function - Locomotion: Ambulation Assistive device: Walker-rolling Max distance: 160 Assist level: Touching or steadying assistance (Pt > 75%) Assist level: Touching or steadying assistance (Pt > 75%) Assist level: Touching or steadying assistance (Pt > 75%) Assist level: Touching or steadying assistance (Pt > 75%)  Function - Comprehension Comprehension: Auditory Comprehension assist level: Understands basic 90% of the time/cues < 10% of the time  Function - Expression Expression: Verbal Expression assist level: Expresses complex ideas: With extra time/assistive device  Function - Social Interaction Social Interaction assist level: Interacts appropriately with others - No medications needed.  Function - Problem Solving Problem solving assist level: Solves basic 50 - 74% of the time/requires cueing 25 - 49% of the time  Function -  Memory Memory assist level: More than reasonable amount of time Patient normally able to recall (first 3 days only): That he or she is in a hospital, Location of own room, Current season  Medical Problem List and Plan: 1.  Poor safety awareness, limitations with self-care, balance deficits secondary to left basal ganglial hemorrhage. Initiate CIR PT, OT, SLP, suspect orientation issue may be related to word finding him a speech to eval 2.  DVT Prophylaxis/Anticoagulation: Pharmaceutical: Lovenox Plt nl 232K will monitor 3. Pain Management: N/A 4. Mood: LCSW to follow for evaluation and support 5. Neuropsych: This patient is not fully capable of making decisions on his own behalf. 6. Skin/Wound Care: Maintain adequate nutritional and hydration status. Routine pressure relief measures.   7. Fluids/Electrolytes/Nutrition: Monitor I/O. Check lytes in am.  8. HTN:  Monitor BP bid-continue catapres and lisinopril. No dosage change Vitals:   04/09/16 2037 04/10/16 0430  BP: (!) 153/85 140/80  Pulse:  84  Resp:  17  Temp:  98.2 F (36.8 C)   9. Tobacco/ cocaine abuse: continue to educate on importance of cessation.  10. Large left sided hydrocele: follow up with urology after discharge.  11. B 12 deficiency with anemia:Question anemia of chronic disease-- B 12 level- 136. Will supplement. Also add iron and monitor.  12. CKD: Baseline SCr -1.3. Continue to monitor with serial checks. 2/8 lab at baseline 13.  Anemia- ~.5gm drop since admit, likely  acute blood loss but may have some chronic anemia as well LOS (Days) 2 A FACE TO FACE EVALUATION WAS PERFORMED  KIRSTEINS,ANDREW E 04/10/2016, 8:18 AM

## 2016-04-10 NOTE — Progress Notes (Signed)
Speech Language Pathology Daily Session Note  Patient Details  Name: Andrew Cuevas MRN: [id number] Date of Birth: [birth year]  Today's Date: 04/10/2016 SLP Individual Time: 0820-0900 SLP Individual Time Calculation (min): 40 min  Short Term Goals: Week 1: SLP Short Term Goal 1 (Week 1): Patient will demonstrate functional problem solving with Min A verbal cues.  SLP Short Term Goal 2 (Week 1): Patient will demonstrate recall of new information with Min A multimodal cues.  SLP Short Term Goal 3 (Week 1): Patient will complete generative naming tasks with Min A verbal cues.  SLP Short Term Goal 4 (Week 1): Patient will verbally communicate wants/needs at the sentence level with Min A verbal cues to self-monitor and correct verbal errors.  SLP Short Term Goal 5 (Week 1): Patient will follow multi-step commnads with supervision verbal cues.   Skilled Therapeutic Interventions: Skilled treatment session focused on cognitive-linguistic goals. Upon arrival, patient reported he felt weak and did not feel well. RN made aware. Patient was also supine and was lying on saturated sheets, suspect due to incontinent episodes. Patient completed basic self-care tasks with Min A verbal cues. Patient also completed generative naming tasks with extra time and supervision verbal cues. Patient left supine in bed with all needs within reach. Continue with current plan of care.      Function:   Cognition Comprehension Comprehension assist level: Understands basic 75 - 89% of the time/ requires cueing 10 - 24% of the time  Expression   Expression assist level: Expresses basic 75 - 89% of the time/requires cueing 10 - 24% of the time. Needs helper to occlude trach/needs to repeat words.  Social Interaction Social Interaction assist level: Interacts appropriately 90% of the time - Needs monitoring or encouragement for participation or interaction.  Problem Solving Problem solving assist level: Solves basic 50 - 74% of  the time/requires cueing 25 - 49% of the time  Memory Memory assist level: Recognizes or recalls 90% of the time/requires cueing < 10% of the time    Pain No/Denies Pain   Therapy/Group: Individual Therapy  Loi Rennaker 04/10/2016, 3:44 PM

## 2016-04-10 NOTE — Progress Notes (Signed)
Occupational Therapy Session Note  Patient Details  Name: Andrew AbbottJohn Cuevas MRN: [id number] Date of Birth: [birth year]  Today's Date: 04/10/2016 OT Individual Time: 1001-1057 and 1459-1534 OT Individual Time Calculation (min): 56 min and 35 min   Short Term Goals: Week 1:  OT Short Term Goal 1 (Week 1): Pt will complete toilet transfers with LRAD with supervision OT Short Term Goal 2 (Week 1): Pt will complete bathing at sit > stand level with supervision OT Short Term Goal 3 (Week 1): Pt will complete LB dressing with min assist at sit > stand level OT Short Term Goal 4 (Week 1): Pt will complete 3 grooming tasks in standing with supervision  Skilled Therapeutic Interventions/Progress Updates: Pt was lying in bed with family present at time of arrival, agreeable to tx. Tx focus on adaptive bathing/dressing, safety awareness, standing balance, Rt inattention, and R UE NMR. Supine<sit completed with close supervision. Stand pivot<w/c<toilet completed with Min A and cues for walker safety. Toileting tasks completed with steady assist. Afterwards pt declined shower and opted to bathe at sink with setup. He required overall Min A for bathing with cues for safety, organization/sequencing, Rt side awareness, and termination of tasks. Dressing completed with Min A for hemi techniques and right footwear. OT assistted with placing pt in figure 4 position for increasing safety and ease of completion. At end of session pt was transferred back to bed in manner as written above. He was left with xray tech at time of departure for procedure.      2nd Session 1:1 tx (35 min) Pt was sitting in recliner at time of arrival, agreeable to tx. Tx focus on R UE NMR, standing balance, and Rt inattention. Pt ambulated short distance from recliner to w/c with Min A and RW. While in w/c, pt [facility] strengthening and endurance. Cues provided for technique and avoidance of barriers on Rt side. Pt  then participated in dynavision activity while standing with RW and min guard. Settings isolated to right upper/lower quadrants for right visual field scanning while using R digits. Longest standing time without rest 7 minutes. Average reaction time 4.04 seconds. Pt then self propelled back to room in manner as written above, able to pathfind with min vcs. He was transferred to bed with RW and Min A, repositioned for comfort, and left with all needs within reach and bed alarm activated.   Therapy Documentation Precautions:  Precautions Precautions: Fall Restrictions Weight Bearing Restrictions: No General:   Vital Signs: Oxygen Therapy SpO2: 95 % O2 Device: Not Delivered Pain: No c/o pain during session    ADL:      See Function Navigator for Current Functional Status.   Therapy/Group: Individual Therapy  Monike Bragdon A Loleta Frommelt 04/10/2016, 12:13 PM

## 2016-04-10 NOTE — Progress Notes (Signed)
Subjective/Complaints:  Per RN has had coughing mainly at night. Nonproductive, no fever, no shortness of breath, O2 sats are good  Review of systems negative for chest pain, shortness of breath, nausea, vomiting, bowel or bladder issues. No pain issues  Objective: Vital Signs: Blood pressure 140/80, pulse 84, temperature 98.2 F (36.8 C), temperature source Oral, resp. rate 17, height _0  (1.778 m), weight 100.6 kg (221 lb 12.8 oz), SpO2 95 %. No results found. Results for orders placed or performed during the hospital encounter of 04/08/16 (from the past 72 hour(s))  Creatinine, serum     Status: Abnormal   Collection Time: 04/08/16  3:56 PM  Result Value Ref Range   Creatinine, Ser 1.33 (H) 0.61 - 1.24 mg/dL   GFR calc non Af Amer 52 (L) >60 mL/min   GFR calc Af Amer >60 >60 mL/min    Comment: (NOTE) The eGFR has been calculated using the CKD EPI equation. This calculation has not been validated in all clinical situations. eGFR's persistently <60 mL/min signify possible Chronic Kidney Disease.   CBC WITH DIFFERENTIAL     Status: Abnormal   Collection Time: 04/09/16  4:49 AM  Result Value Ref Range   WBC 3.9 (L) 4.0 - 10.5 K/uL   RBC 4.21 (L) 4.22 - 5.81 MIL/uL   Hemoglobin 10.5 (L) 13.0 - 17.0 g/dL   HCT 33.4 (L) 39.0 - 52.0 %   MCV 79.3 78.0 - 100.0 fL   MCH 24.9 (L) 26.0 - 34.0 pg   MCHC 31.4 30.0 - 36.0 g/dL   RDW 16.0 (H) 11.5 - 15.5 %   Platelets 232 150 - 400 K/uL   Neutrophils Relative % 25 %   Lymphocytes Relative 58 %   Monocytes Relative 12 %   Eosinophils Relative 4 %   Basophils Relative 1 %   Neutro Abs 1.0 (L) 1.7 - 7.7 K/uL   Lymphs Abs 2.2 0.7 - 4.0 K/uL   Monocytes Absolute 0.5 0.1 - 1.0 K/uL   Eosinophils Absolute 0.2 0.0 - 0.7 K/uL   Basophils Absolute 0.0 0.0 - 0.1 K/uL   RBC Morphology TEARDROP CELLS   Comprehensive metabolic panel     Status: Abnormal   Collection Time: 04/09/16  4:49 AM  Result Value Ref Range   Sodium 136 135 - 145  mmol/L   Potassium 4.1 3.5 - 5.1 mmol/L   Chloride 104 101 - 111 mmol/L   CO2 22 22 - 32 mmol/L   Glucose, Bld 92 65 - 99 mg/dL   BUN 13 6 - 20 mg/dL   Creatinine, Ser 1.29 (H) 0.61 - 1.24 mg/dL   Calcium 9.1 8.9 - 10.3 mg/dL   Total Protein 6.9 6.5 - 8.1 g/dL   Albumin 3.3 (L) 3.5 - 5.0 g/dL   AST 21 15 - 41 U/L   ALT 15 (L) 17 - 63 U/L   Alkaline Phosphatase 53 38 - 126 U/L   Total Bilirubin 0.6 0.3 - 1.2 mg/dL   GFR calc non Af Amer 54 (L) >60 mL/min   GFR calc Af Amer >60 >60 mL/min    Comment: (NOTE) The eGFR has been calculated using the CKD EPI equation. This calculation has not been validated in all clinical situations. eGFR's persistently <60 mL/min signify possible Chronic Kidney Disease.    Anion gap 10 5 - 15  Pathologist smear review     Status: None   Collection Time: 04/09/16  4:49 AM  Result Value Ref Range  Path Review Neutropenia.     Comment: Anemia. Reviewed by Casimer Lanius, MD 04/09/2016       General: No acute distress Mood and affect are appropriate Heart: Regular rate and rhythm no rubs murmurs or extra sounds Lungs: Clear to auscultation, breathing unlabored, no rales or wheezes Abdomen: Positive bowel sounds, soft nontender to palpation, nondistended Extremities: No clubbing, cyanosis, or edema Skin: No evidence of breakdown, no evidence of rash Neurologic: Cranial nerves II through XII intact, motor strength is 5/5 in left deltoid, bicep, tricep, grip, hip flexor, knee extensors, ankle dorsiflexor and plantar flexor 4/5 right side Sensory exam normal sensation to light touch and proprioception in bilateral upper and lower extremities Cerebellar exam , dysmetria , right finger to nose to finger as well as heel to shin in upper and lower extremities, no evidence of dysmetria on the left side. Musculoskeletal: Full range of motion in all 4 extremities. No joint swelling Oriented to person, hospital, but not name of hospital. Oriented to  time  Assessment/Plan: 1. Functional deficits secondary to acute Left Basal ganglia ICH which require 3+ hours per day of interdisciplinary therapy in a comprehensive inpatient rehab setting. Physiatrist is providing close team supervision and 24 hour management of active medical problems listed below. Physiatrist and rehab team continue to assess barriers to discharge/monitor patient progress toward functional and medical goals. FIM: Function - Bathing Position: Shower Body parts bathed by patient: Right arm, Left arm, Chest, Abdomen, Front perineal area, Right upper leg, Left upper leg, Right lower leg, Left lower leg Body parts bathed by helper: Buttocks, Back Assist Level: Touching or steadying assistance(Pt > 75%)  Function- Upper Body Dressing/Undressing What is the patient wearing?: Hospital gown Assist Level: Set up Function - Lower Body Dressing/Undressing What is the patient wearing?: Hospital Gown, Non-skid slipper socks Position: Wheelchair/chair at sink Non-skid slipper socks- Performed by patient: Don/doff right sock, Don/doff left sock Assist for footwear: Setup Assist for lower body dressing: Set up Set up : To obtain clothing/put away  Function - Toileting Toileting steps completed by patient: Performs perineal hygiene, Adjust clothing after toileting Toileting steps completed by helper: Adjust clothing prior to toileting Toileting Assistive Devices: Grab bar or rail Assist level: Touching or steadying assistance (Pt.75%)  Function - Toilet Transfers Toilet transfer assistive device: Grab bar, Walker Assist level to toilet: Touching or steadying assistance (Pt > 75%) Assist level from toilet: Touching or steadying assistance (Pt > 75%)  Function - Chair/bed transfer Chair/bed transfer method: Squat pivot, Ambulatory Chair/bed transfer assist level: Touching or steadying assistance (Pt > 75%) Chair/bed transfer assistive device: Armrests, Walker Chair/bed  transfer details: Verbal cues for technique, Verbal cues for precautions/safety, Tactile cues for placement  Function - Locomotion: Wheelchair Will patient use wheelchair at discharge?: No Wheelchair activity did not occur: N/A Function - Locomotion: Ambulation Assistive device: Walker-rolling Max distance: 160 Assist level: Touching or steadying assistance (Pt > 75%) Assist level: Touching or steadying assistance (Pt > 75%) Assist level: Touching or steadying assistance (Pt > 75%) Assist level: Touching or steadying assistance (Pt > 75%)  Function - Comprehension Comprehension: Auditory Comprehension assist level: Understands basic 90% of the time/cues < 10% of the time  Function - Expression Expression: Verbal Expression assist level: Expresses complex ideas: With extra time/assistive device  Function - Social Interaction Social Interaction assist level: Interacts appropriately with others - No medications needed.  Function - Problem Solving Problem solving assist level: Solves basic 50 - 74% of the time/requires cueing 25 -  49% of the time  Function - Memory Memory assist level: More than reasonable amount of time Patient normally able to recall (first 3 days only): That he or she is in a hospital, Location of own room, Current season  Medical Problem List and Plan: 1.  Poor safety awareness, limitations with self-care, balance deficits secondary to left basal ganglial hemorrhage. 2 CIR PT, OT, SLP,2.  DVT Prophylaxis/Anticoagulation: Pharmaceutical: Lovenox, without evidence of bleeding 3. Pain Management: N/A 4. Mood: LCSW to follow for evaluation and support 5. Neuropsych: This patient is not fully capable of making decisions on his own behalf. 6. Skin/Wound Care: Maintain adequate nutritional and hydration status. Routine pressure relief measures.   7. Fluids/Electrolytes/Nutrition: Monitor I/O. Check lytes in am.  8. HTN: Monitor BP bid-continue catapres and lisinopril.  No dosage change 2/9 Vitals:   04/09/16 2037 04/10/16 0430  BP: (!) 153/85 140/80  Pulse:  84  Resp:  17  Temp:  98.2 F (36.8 C)   9. Tobacco/ cocaine abuse: continue to educate on importance of cessation. Lung congestion may be related, recheck chest x-ray. There was platelike atelectasis on the right mobile. Will need incentive spirometry 10. Large left sided hydrocele: follow up with urology after discharge.  11. B 12 deficiency with anemia:Question anemia of chronic disease-- B 12 level- 136. Will supplement. Also add iron and monitor.  12. CKD: Baseline SCr -1.3. Continue to monitor with serial checks. 2/8 lab at baseline  LOS (Days) 2 A FACE TO FACE EVALUATION WAS PERFORMED  KIRSTEINS,ANDREW E 04/10/2016, 8:47 AM

## 2016-04-11 ENCOUNTER — Inpatient Hospital Stay (HOSPITAL_COMMUNITY): Payer: Medicare Other | Admitting: Occupational Therapy

## 2016-04-11 ENCOUNTER — Inpatient Hospital Stay (HOSPITAL_COMMUNITY): Payer: Medicare Other | Admitting: Physical Therapy

## 2016-04-11 ENCOUNTER — Inpatient Hospital Stay (HOSPITAL_COMMUNITY): Payer: Medicare Other | Admitting: Speech Pathology

## 2016-04-11 DIAGNOSIS — N183 Chronic kidney disease, stage 3 unspecified: Secondary | ICD-10-CM

## 2016-04-11 DIAGNOSIS — R05 Cough: Secondary | ICD-10-CM

## 2016-04-11 DIAGNOSIS — R9389 Abnormal findings on diagnostic imaging of other specified body structures: Secondary | ICD-10-CM

## 2016-04-11 DIAGNOSIS — I10 Essential (primary) hypertension: Secondary | ICD-10-CM

## 2016-04-11 DIAGNOSIS — R059 Cough, unspecified: Secondary | ICD-10-CM

## 2016-04-11 DIAGNOSIS — R938 Abnormal findings on diagnostic imaging of other specified body structures: Secondary | ICD-10-CM

## 2016-04-11 MED ORDER — LISINOPRIL 20 MG PO TABS
30.0000 mg | ORAL_TABLET | Freq: Every day | ORAL | Status: DC
  Administered 2016-04-11 – 2016-04-18 (×8): 30 mg via ORAL
  Filled 2016-04-11 (×8): qty 1

## 2016-04-11 NOTE — Progress Notes (Signed)
Subjective/Complaints: Pt sitting up in bed this AM.  He states he slept well overnight. No issues per nursing.  Review of systems: Denies chest pain, shortness of breath, nausea, vomiting, bowel or bladder issues.  Objective: Vital Signs: Blood pressure (!) 146/69, pulse (!) 56, temperature 98.3 F (36.8 C), temperature source Oral, resp. rate 16, height 5' 10"  (1.778 m), weight 100.6 kg (221 lb 12.8 oz), SpO2 94 %. Dg Chest 2 View  Result Date: 04/10/2016 CLINICAL DATA:  Shortness of breath for 1 week. EXAM: CHEST  2 VIEW COMPARISON:  April 05, 2016 FINDINGS: The opacity in the right base remains but is less well defined. Minimal opacity in the left base may be atelectasis. No change in cardiomegaly. The thoracic aorta is tortuous. No pneumothorax. No other acute abnormalities. IMPRESSION: 1. Improving right basilar opacity. Recommend follow-up to complete resolution. 2. Minimal opacity in the left base may be atelectasis. Recommend attention on follow-up. Electronically Signed   By: Dorise Bullion III M.D   On: 04/10/2016 11:32   Results for orders placed or performed during the hospital encounter of 04/08/16 (from the past 72 hour(s))  Creatinine, serum     Status: Abnormal   Collection Time: 04/08/16  3:56 PM  Result Value Ref Range   Creatinine, Ser 1.33 (H) 0.61 - 1.24 mg/dL   GFR calc non Af Amer 52 (L) >60 mL/min   GFR calc Af Amer >60 >60 mL/min    Comment: (NOTE) The eGFR has been calculated using the CKD EPI equation. This calculation has not been validated in all clinical situations. eGFR's persistently <60 mL/min signify possible Chronic Kidney Disease.   CBC WITH DIFFERENTIAL     Status: Abnormal   Collection Time: 04/09/16  4:49 AM  Result Value Ref Range   WBC 3.9 (L) 4.0 - 10.5 K/uL   RBC 4.21 (L) 4.22 - 5.81 MIL/uL   Hemoglobin 10.5 (L) 13.0 - 17.0 g/dL   HCT 33.4 (L) 39.0 - 52.0 %   MCV 79.3 78.0 - 100.0 fL   MCH 24.9 (L) 26.0 - 34.0 pg   MCHC 31.4 30.0 -  36.0 g/dL   RDW 16.0 (H) 11.5 - 15.5 %   Platelets 232 150 - 400 K/uL   Neutrophils Relative % 25 %   Lymphocytes Relative 58 %   Monocytes Relative 12 %   Eosinophils Relative 4 %   Basophils Relative 1 %   Neutro Abs 1.0 (L) 1.7 - 7.7 K/uL   Lymphs Abs 2.2 0.7 - 4.0 K/uL   Monocytes Absolute 0.5 0.1 - 1.0 K/uL   Eosinophils Absolute 0.2 0.0 - 0.7 K/uL   Basophils Absolute 0.0 0.0 - 0.1 K/uL   RBC Morphology TEARDROP CELLS   Comprehensive metabolic panel     Status: Abnormal   Collection Time: 04/09/16  4:49 AM  Result Value Ref Range   Sodium 136 135 - 145 mmol/L   Potassium 4.1 3.5 - 5.1 mmol/L   Chloride 104 101 - 111 mmol/L   CO2 22 22 - 32 mmol/L   Glucose, Bld 92 65 - 99 mg/dL   BUN 13 6 - 20 mg/dL   Creatinine, Ser 1.29 (H) 0.61 - 1.24 mg/dL   Calcium 9.1 8.9 - 10.3 mg/dL   Total Protein 6.9 6.5 - 8.1 g/dL   Albumin 3.3 (L) 3.5 - 5.0 g/dL   AST 21 15 - 41 U/L   ALT 15 (L) 17 - 63 U/L   Alkaline Phosphatase 53 38 -  126 U/L   Total Bilirubin 0.6 0.3 - 1.2 mg/dL   GFR calc non Af Amer 54 (L) >60 mL/min   GFR calc Af Amer >60 >60 mL/min    Comment: (NOTE) The eGFR has been calculated using the CKD EPI equation. This calculation has not been validated in all clinical situations. eGFR's persistently <60 mL/min signify possible Chronic Kidney Disease.    Anion gap 10 5 - 15  Pathologist smear review     Status: None   Collection Time: 04/09/16  4:49 AM  Result Value Ref Range   Path Review Neutropenia.     Comment: Anemia. Reviewed by Casimer Lanius, MD 04/09/2016       General: No acute distress. Vital signs reviewed. Psych: Mood and affect are appropriate Heart: Regular rate and rhythm. No JVD. Lungs: Clear to auscultation, breathing unlabored Abdomen: Positive bowel sounds, soft  Skin: No evidence of breakdown, no evidence of rash Neurologic: Alert Motor 5/5 in left deltoid, bicep, tricep, grip, hip flexor, knee extensors, ankle dorsiflexor and plantar  flexor 4+/5 right side with dysmetria  Musculoskeletal: No edema. No tenderness.   Assessment/Plan: 1. Functional deficits secondary to acute Left Basal ganglia ICH which require 3+ hours per day of interdisciplinary therapy in a comprehensive inpatient rehab setting. Physiatrist is providing close team supervision and 24 hour management of active medical problems listed below. Physiatrist and rehab team continue to assess barriers to discharge/monitor patient progress toward functional and medical goals. FIM: Function - Bathing Position: Wheelchair/chair at sink Body parts bathed by patient: Right arm, Left arm, Chest, Abdomen, Front perineal area, Right upper leg, Right lower leg, Left lower leg Body parts bathed by helper: Back Assist Level: Touching or steadying assistance(Pt > 75%)  Function- Upper Body Dressing/Undressing What is the patient wearing?: Pull over shirt/dress Pull over shirt/dress - Perfomed by patient: Thread/unthread right sleeve, Thread/unthread left sleeve, Put head through opening, Pull shirt over trunk Assist Level: Supervision or verbal cues Function - Lower Body Dressing/Undressing What is the patient wearing?: Underwear, Pants, Socks, Shoes Position: Wheelchair/chair at [employer] - Performed by patient: Thread/unthread right underwear leg, Thread/unthread left underwear leg, Pull underwear up/down Pants- Performed by patient: Thread/unthread right pants leg, Thread/unthread left pants leg, Pull pants up/down Non-skid slipper socks- Performed by patient: Don/doff right sock, Don/doff left sock Socks - Performed by patient: Don/doff left sock Socks - Performed by helper: Don/doff right sock Shoes - Performed by patient: Don/doff left shoe, Fasten right, Fasten left Shoes - Performed by helper: Don/doff right shoe Assist for footwear: Setup Assist for lower body dressing: Touching or steadying assistance (Pt > 75%) Set up : To obtain clothing/put  away  Function - Toileting Toileting steps completed by patient: Adjust clothing prior to toileting, Performs perineal hygiene, Adjust clothing after toileting Toileting steps completed by helper: Adjust clothing prior to toileting [place]: Grab bar or rail Assist level: Touching or steadying assistance (Pt.75%)  Function - Toilet Transfers Toilet transfer assistive device: Grab bar, Walker Assist level to toilet: Touching or steadying assistance (Pt > 75%) Assist level from toilet: Touching or steadying assistance (Pt > 75%)  Function - Chair/bed transfer Chair/bed transfer method: Ambulatory, Stand pivot Chair/bed transfer assist level: Touching or steadying assistance (Pt > 75%) Chair/bed transfer assistive device: Walker, Armrests Chair/bed transfer details: Verbal cues for technique, Verbal cues for precautions/safety, Tactile cues for placement  Function - Locomotion: Wheelchair Will patient use wheelchair at discharge?: No Wheelchair activity did not occur: N/A Function -  Locomotion: Ambulation Assistive device: Walker-rolling Max distance: 160 Assist level: Touching or steadying assistance (Pt > 75%) Assist level: Touching or steadying assistance (Pt > 75%) Assist level: Touching or steadying assistance (Pt > 75%) Assist level: Touching or steadying assistance (Pt > 75%) Assist level: Touching or steadying assistance (Pt > 75%)  Function - Comprehension Comprehension: Auditory Comprehension assist level: Understands basic 75 - 89% of the time/ requires cueing 10 - 24% of the time  Function - Expression Expression: Verbal Expression assist level: Expresses basic 75 - 89% of the time/requires cueing 10 - 24% of the time. Needs helper to occlude trach/needs to repeat words.  Function - Social Interaction Social Interaction assist level: Interacts appropriately 90% of the time - Needs monitoring or encouragement for participation or  interaction.  Function - Problem Solving Problem solving assist level: Solves basic 50 - 74% of the time/requires cueing 25 - 49% of the time  Function - Memory Memory assist level: Recognizes or recalls 90% of the time/requires cueing < 10% of the time Patient normally able to recall (first 3 days only): Current season, Location of own room, Staff names and faces, That he or she is in a hospital  Medical Problem List and Plan: 1.  Poor safety awareness, limitations with self-care, balance deficits secondary to left basal ganglial hemorrhage.  Cont CIR 2.  DVT Prophylaxis/Anticoagulation: Pharmaceutical: Lovenox, without evidence of bleeding  3. Pain Management: N/A 4. Mood: LCSW to follow for evaluation and support 5. Neuropsych: This patient is not fully capable of making decisions on his own behalf. 6. Skin/Wound Care: Maintain adequate nutritional and hydration status. Routine pressure relief measures.   7. Fluids/Electrolytes/Nutrition: Monitor I/O. Check lytes in am.  8. HTN: Monitor BP bid-continue catapres  Lisinopril increased to 30 on 2/10.  9. Tobacco/ cocaine abuse: continue to educate on importance of cessation.  CXR 2/9 reviewed showing improvedment in right infiltrate and left ?atelectasis.  Will order repeat next week for follow up 10. Large left sided hydrocele: follow up with urology after discharge.  11. B 12 deficiency with anemia:Question anemia of chronic disease-- B 12 level- 136. Supplemented. Added iron and monitor.  12. CKD: Baseline SCr -1.3. Continue to monitor with serial checks.   2/8 lab at baseline   LOS (Days) 3 A FACE TO FACE EVALUATION WAS PERFORMED  Rondell Frick Lorie Phenix 04/11/2016, 8:26 AM

## 2016-04-11 NOTE — Progress Notes (Signed)
Occupational Therapy Session Note  Patient Details  Name: Andrew AbbottJohn Cuevas MRN: [id number] Date of Birth: 12-27-1945  Today's Date: 04/11/2016 OT Individual Time: 1300-1330 OT Individual Time Calculation (min): 30 min    Short Term Goals: Week 1:  OT Short Term Goal 1 (Week 1): Pt will complete toilet transfers with LRAD with supervision OT Short Term Goal 2 (Week 1): Pt will complete bathing at sit > stand level with supervision OT Short Term Goal 3 (Week 1): Pt will complete LB dressing with min assist at sit > stand level OT Short Term Goal 4 (Week 1): Pt will complete 3 grooming tasks in standing with supervision  Skilled Therapeutic Interventions/Progress Updates: Patient participated in dynamic standing balance to complete PNF diagonals 1 and 2 with UEs and dynamic standing balance activities to increase self care and transfers.  He was assisted back to bed with close S and left with call bell and phone within reach.     Therapy Documentation Precautions:  Precautions Precautions: Fall Restrictions Weight Bearing Restrictions: No  Pain:denied    See Function Navigator for Current Functional Status.   Therapy/Group: Individual Therapy  Bud Faceickett, [name] [facility] 04/11/2016, 4:24 PM

## 2016-04-11 NOTE — IPOC Note (Signed)
Overall Plan of Care [facility]) Patient Details Name: Andrew Cuevas MRN: [id number] DOB: 10-May-1945  Admitting Diagnosis: L ICH  Hospital Problems: Active Problems:   Right hemiparesis (HCC)   Nontraumatic acute hemorrhage of basal ganglia (HCC)   Cough   Abnormal chest x-ray   Stage 3 chronic kidney disease     Functional Problem List: Nursing Bladder, Bowel, Endurance, Medication Management, Motor, Safety  PT Balance, Endurance, Motor, Perception, Safety  OT Balance, Cognition, Endurance, Motor, Safety  SLP    TR         Basic ADL's: OT Grooming, Bathing, Dressing, Toileting     Advanced  ADL's: OT       Transfers: PT Bed Mobility, Bed to Chair, Car, Furniture, Floor  OT Toilet, Tub/Shower     Locomotion: PT Ambulation, Stairs     Additional Impairments: OT Fuctional Use of Upper Extremity  SLP Communication, Social Cognition comprehension, expression Social Interaction, Problem Solving, Memory, Awareness  TR      Anticipated Outcomes Item Anticipated Outcome  Self Feeding Mod I  Swallowing      Basic self-care  Supervision/setup  Toileting  Supervision/setup   Bathroom Transfers Supervision/setup  Bowel/Bladder  Continent to bowel and bladder with min. assist.  Transfers  mod I  Locomotion  supervision ambulatory  Communication  Supervision  Cognition  Supervision-Min A   Pain  Less than 3,on 1 to 10 scale.  Safety/Judgment  Free from falls during his stay in rehab.   Therapy Plan: PT Intensity: Minimum of 1-2 x/day ,45 to 90 minutes PT Frequency: 5 out of 7 days PT Duration Estimated Length of Stay: 12-14 days OT Intensity: Minimum of 1-2 x/day, 45 to 90 minutes OT Frequency: 5 out of 7 days OT Duration/Estimated Length of Stay: 10-14 days SLP Intensity: Minumum of 1-2 x/day, 30 to 90 minutes SLP Frequency: 3 to 5 out of 7 days SLP Duration/Estimated Length of Stay: 12-14 days        Team Interventions: Nursing Interventions  Patient/Family Education, Bladder Management, Bowel Management, Medication Management, Disease Management/Prevention, Discharge Planning  PT interventions Ambulation/gait training, Community reintegration, DME/adaptive equipment instruction, Neuromuscular re-education, Psychosocial support, Stair training, UE/LE Strength taining/ROM, UE/LE Coordination activities, Therapeutic Activities, Discharge planning, Warden/ranger, Cognitive remediation/compensation, Disease management/prevention, Functional electrical stimulation, Functional mobility training, Patient/family education, Splinting/orthotics, Therapeutic Exercise, Visual/perceptual remediation/compensation  OT Interventions Warden/ranger, Cognitive remediation/compensation, Community reintegration, Discharge planning, Disease mangement/prevention, DME/adaptive equipment instruction, Functional mobility training, Pain management, Patient/family education, Psychosocial support, Neuromuscular re-education, Self Care/advanced ADL retraining, Therapeutic Activities, Therapeutic Exercise, UE/LE Strength taining/ROM, UE/LE Coordination activities, Visual/perceptual remediation/compensation  SLP Interventions Cueing hierarchy, Cognitive remediation/compensation, Patient/family education, Functional tasks, Environmental controls, Therapeutic Activities, Internal/external aids, Speech/Language facilitation  TR Interventions    SW/CM Interventions Discharge Planning, Psychosocial Support, Patient/Family Education    Team Discharge Planning: Destination: PT-Home ,OT- Home , SLP-Home Projected Follow-up: PT-Home health PT, 24 hour supervision/assistance, OT-  Home health OT, Outpatient OT, 24 hour supervision/assistance, SLP-24 hour supervision/assistance, Home Health SLP, Outpatient SLP Projected Equipment Needs: PT-To be determined, OT- Tub/shower bench, To be determined, SLP-None recommended by SLP Equipment Details: PT- , OT-   Patient/family involved in discharge planning: PT- Patient,  OT-Patient, SLP-Patient  MD ELOS: 10-14 days. Medical Rehab Prognosis:  Good Assessment: 71 y.o.malewith history of HTN, chronic HA, prior CVA without significant residual deficits who presented to ED on 04/02/16 with one month history of testicular pain and was found to have right sided weakness, soft voice and mild facial weakness. History  taken from chart review and patient. BP elevated at admission 245/133 and he was started on cardene drip. Left testicular pain felt to be due to very large left sided hydrocele--no intervention needed. CT head reviewed, showing acute left basal ganglial hemorrhage with mild edema and atrophy. CTA head and neck without stenosis, dissection, plaque or AVM. 2D echo with EF 65-70% with mild concentric LVH and grade 2 diastolic dysfunction. UDS positive for cocaine. BLE dopplers negative for DVT. Dr. Roda ShuttersXu felt that bleed due to HTN emergency and cocaine abuse. Patient noted to have poor safety with ADL tasks, problems sequencing, balance deficits with festinating gait. Will set goals for supervision with most tasks with PT/OT/SLP.    See Team Conference Notes for weekly updates to the plan of care

## 2016-04-11 NOTE — Progress Notes (Signed)
Speech Language Pathology Daily Session Note  Patient Details  Name: Andrew AbbottJohn Cuevas MRN: [id number] Date of Birth: 20-Nov-1945  Today's Date: 04/11/2016 SLP Individual Time: [id number] SLP Individual Time Calculation (min): 45 min  Short Term Goals: Week 1: SLP Short Term Goal 1 (Week 1): Patient will demonstrate functional problem solving with Min A verbal cues.  SLP Short Term Goal 2 (Week 1): Patient will demonstrate recall of new information with Min A multimodal cues.  SLP Short Term Goal 3 (Week 1): Patient will complete generative naming tasks with Min A verbal cues.  SLP Short Term Goal 4 (Week 1): Patient will verbally communicate wants/needs at the sentence level with Min A verbal cues to self-monitor and correct verbal errors.  SLP Short Term Goal 5 (Week 1): Patient will follow multi-step commnads with supervision verbal cues.   Skilled Therapeutic Interventions: Skilled treatment session focused on cognition goals. SLP facilitated session by providing supervision cues to follow multi-step directions in a mildly distracting environment. Pt able to recall precautions within ADL tasks such as using walker, pulling call light while in the bathroom with Min A question cues. Pt demonstrated functional problem solving with supervision cues while performing pipe design tasks. Pt was return to room, left in nursing care in bathroom. Continue current plan of care.      Function:    Cognition Comprehension Comprehension assist level: Understands basic 75 - 89% of the time/ requires cueing 10 - 24% of the time  Expression   Expression assist level: Expresses basic 75 - 89% of the time/requires cueing 10 - 24% of the time. Needs helper to occlude trach/needs to repeat words.  Social Interaction Social Interaction assist level: Interacts appropriately 90% of the time - Needs monitoring or encouragement for participation or interaction.  Problem Solving Problem solving assist level: Solves basic  50 - 74% of the time/requires cueing 25 - 49% of the time  Memory Memory assist level: Recognizes or recalls 90% of the time/requires cueing < 10% of the time    Pain    Therapy/Group: Individual Therapy  Illianna Paschal B. Dreama Saaverton, M.S., CCC-SLP Speech-Language Pathologist   Andrew Cuevas 04/11/2016, 8:42 AM

## 2016-04-11 NOTE — Progress Notes (Signed)
Physical Therapy Session Note  Patient Details  Name: Andrew Cuevas MRN: [id number] Date of Birth: March 01, 1946  Today's Date: 04/11/2016 PT Individual Time: 1100-1158 PT Individual Time Calculation (min): 58 min   Short Term Goals: Week 1:  PT Short Term Goal 1 (Week 1): pt will ambulate 200' with LRAD and min guard PT Short Term Goal 2 (Week 1): Pt will transfer with supervision and LRAD  Skilled Therapeutic Interventions/Progress Updates: Pt received supine in bed, no reports of pain. Performed supine > sit EOB with supervision and performed gait x 200' with RW and min A with max-total verbal cues to maintain upright posture, full step and stride length and foot clearance.  Performed bilat UE and LE strengthening, endurance and reciprocal training on Nustep x 8 minutes x 4 extremities changing to LE only for extensor strengthening x 2 minutes.  Performed gait training to facilitate increased bilat stance time and step length by placing orange band around RW legs as visual cues for step length (pt cued to kick orange band); also performed up/down 12 stairs with 2 rails with alternating sequence with min A to facilitate increased forwards weight shifting, hip extension and foot clearance/step length.  Performed NMR: see below for details.  Performed gait back to room with RW with visual feedback of orange band for increased step length but continued to require mod-max verbal cues for upright posture and foot clearance; for last 10 feet performed gait with HHA to minimize forward flexion and decreased step length.  Pt left in w/c with quick release belt in place and lunch tray in front of pt     Therapy Documentation Precautions:  Precautions Precautions: Fall Restrictions Weight Bearing Restrictions: No Vital Signs: Therapy Vitals Pulse Rate: 78 BP: (!) 144/78 Patient Position (if appropriate): Sitting Pain: Pain Assessment Pain Assessment: No/denies pain Other Treatments:  Treatments Neuromuscular Facilitation: Right;Lower Extremity;Forced use;Activity to increase motor control;Activity to increase timing and sequencing;Activity to increase grading;Activity to increase sustained activation;Activity to increase lateral weight shifting;Activity to increase anterior-posterior weight shifting during 10 reps R and then L forwards/retro stepping in place with one UE support and therapist providing facilitation of weight shifting; alternating single limb stance x 5 reps x 10 second hold with verbal and tactile cues for full RLE knee and hip extension in stance; sit <> stand from mat x 12 reps without use of UE but with hands clasped in front reaching forwards down <> up to facilitate forwards weight shifting and extension during sit > stand.     See Function Navigator for Current Functional Status.   Therapy/Group: Individual Therapy  Edman CircleHall, Sumie Remsen [facility] 04/11/2016, 12:17 PM

## 2016-04-11 NOTE — Progress Notes (Signed)
Occupational Therapy Session Note  Patient Details  Name: Carlis AbbottJohn Tassinari MRN: [id number] Date of Birth: [birth year]  Today's Date: 04/11/2016 OT Individual Time: 0901-1000 OT Individual Time Calculation (min): 59 min    Short Term Goals: Week 1:  OT Short Term Goal 1 (Week 1): Pt will complete toilet transfers with LRAD with supervision OT Short Term Goal 2 (Week 1): Pt will complete bathing at sit > stand level with supervision OT Short Term Goal 3 (Week 1): Pt will complete LB dressing with min assist at sit > stand level OT Short Term Goal 4 (Week 1): Pt will complete 3 grooming tasks in standing with supervision  Skilled Therapeutic Interventions/Progress Updates: Pt was lying in bed at time of arrival, agreeable to session. Tx focus on R UE NMR, Rt inattention, standing endurance, and cognitive skills. Supine<sit completed with supervision, Min A stand pivot transfer to w/c with instruction on hand placement. Pt self propelled 120 ft to dayroom for bilateral UE strengthening/R UE NMR. Pt then participated in standing task on elevated table. Focus on following discussed directions of card game, turn taking, and attending to activity items placed on Rt side. Pt required mod cues for following instructions and using R UE due to L UE dominance. He stood for 9 minutes without rest. Afterwards, pt participated in seated pipe tree activity with cues for initiating/completing portion on right side. Questioning cues provided for problem solving. At end of session pt self propelled back to room in manner as written above and was left with RN at time of departure.      Therapy Documentation Precautions:  Precautions Precautions: Fall Restrictions Weight Bearing Restrictions: No General:   Vital Signs: Therapy Vitals Pulse Rate: 78 BP: (!) 144/78 Patient Position (if appropriate): Sitting Pain: No c/o pain during session  Pain Assessment Pain Assessment: No/denies pain ADL:  :   Other  Treatments: Treatments Neuromuscular Facilitation: Right;Lower Extremity;Forced use;Activity to increase motor control;Activity to increase timing and sequencing;Activity to increase grading;Activity to increase sustained activation;Activity to increase lateral weight shifting;Activity to increase anterior-posterior weight shifting  See Function Navigator for Current Functional Status.   Therapy/Group: Individual Therapy  Xiomara Sevillano A Novah Goza 04/11/2016, 12:24 PM

## 2016-04-11 NOTE — Plan of Care (Signed)
Problem: RH SAFETY Goal: RH STG ADHERE TO SAFETY PRECAUTIONS W/ASSISTANCE/DEVICE STG Adhere to Safety Precautions With Mod.Assistance/Device.  Outcome: Not Progressing Patient transferred himself to bed from chair without calling for assistance.

## 2016-04-11 NOTE — Progress Notes (Addendum)
Patient left in chair with table in front. Checked on patient and found him lying in bed. Educated patient on need to call for assistance and updated safety plan to indicate quick release belt on when up in chair. Will continue to monitor.  Patient set off bed alarm while attempting to go to bathroom without calling staff.

## 2016-04-12 ENCOUNTER — Inpatient Hospital Stay (HOSPITAL_COMMUNITY): Payer: Medicare Other | Admitting: Occupational Therapy

## 2016-04-12 NOTE — Progress Notes (Signed)
Occupational Therapy Session Note  Patient Details  Name: Andrew AbbottJohn Cuevas MRN: [id number] Date of Birth: 04/09/1945  Today's Date: 04/12/2016 OT Individual Time: [id number] OT Individual Time Calculation (min): 29 min    Short Term Goals: Week 1:  OT Short Term Goal 1 (Week 1): Pt will complete toilet transfers with LRAD with supervision OT Short Term Goal 2 (Week 1): Pt will complete bathing at sit > stand level with supervision OT Short Term Goal 3 (Week 1): Pt will complete LB dressing with min assist at sit > stand level OT Short Term Goal 4 (Week 1): Pt will complete 3 grooming tasks in standing with supervision    Skilled Therapeutic Interventions/Progress Updates: Pt was lying in bed at time of arrival, agreeable to tx. Tx focus on ADL retraining and safety awareness. Supine<sit completed with supervision and pt donned pants and footwear at EOB with extra time. Min guard for lifting pants over hips in standing. Shirt donned with supervision. Afterwards he ambulated to sink to complete oral care/grooming tasks in standing, cues for safe walker management and hand placement during sit<stand transitions. At end of session he was left in w/c with safety belt donned and all needs within reach.      Therapy Documentation Precautions:  Precautions Precautions: Fall Restrictions Weight Bearing Restrictions: No General:   Vital Signs: Therapy Vitals BP: (!) 168/88 Pain: No c/o pain during session    ADL:      See Function Navigator for Current Functional Status.   Therapy/Group: Individual Therapy  Kasey Hansell A Syretta Kochel 04/12/2016, 12:49 PM

## 2016-04-12 NOTE — Progress Notes (Signed)
Subjective/Complaints: Pt seen laying in bed this AM.  He is easily arousable.  He states he slept well overnight. Pt remains impulsive.  Review of systems: Denies chest pain, shortness of breath, nausea, vomiting, bowel or bladder issues.  Objective: Vital Signs: Blood pressure (!) 166/94, pulse 61, temperature 98.7 F (37.1 C), temperature source Oral, resp. rate 17, height 5\' 10"  (1.778 m), weight 100.6 kg (221 lb 12.8 oz), SpO2 98 %. Dg Chest 2 View  Result Date: 04/10/2016 CLINICAL DATA:  Shortness of breath for 1 week. EXAM: CHEST  2 VIEW COMPARISON:  April 05, 2016 FINDINGS: The opacity in the right base remains but is less well defined. Minimal opacity in the left base may be atelectasis. No change in cardiomegaly. The thoracic aorta is tortuous. No pneumothorax. No other acute abnormalities. IMPRESSION: 1. Improving right basilar opacity. Recommend follow-up to complete resolution. 2. Minimal opacity in the left base may be atelectasis. Recommend attention on follow-up. Electronically Signed   By: Gerome Sam III M.D   On: 04/10/2016 11:32   No results found for this or any previous visit (from the past 72 hour(s)).    General: No acute distress. Vital signs reviewed. Psych: Mood and affect are appropriate Heart: RRR. No JVD. Lungs: Clear to auscultation, breathing unlabored Abdomen: Positive bowel sounds, soft  Skin: No evidence of breakdown, no evidence of rash Neurologic: Alert Motor 5/5 in left deltoid, bicep, tricep, grip, hip flexor, knee extensors, ankle dorsiflexor and plantar flexor 4+/5 right side with dysmetria (unchanged) Musculoskeletal: No edema. No tenderness.   Assessment/Plan: 1. Functional deficits secondary to acute Left Basal ganglia ICH which require 3+ hours per day of interdisciplinary therapy in a comprehensive inpatient rehab setting. Physiatrist is providing close team supervision and 24 hour management of active medical problems listed  below. Physiatrist and rehab team continue to assess barriers to discharge/monitor patient progress toward functional and medical goals. FIM: Function - Bathing Position: Wheelchair/chair at sink Body parts bathed by patient: Right arm, Left arm, Chest, Abdomen, Front perineal area, Right upper leg, Right lower leg, Left lower leg Body parts bathed by helper: Back Assist Level: Touching or steadying assistance(Pt > 75%)  Function- Upper Body Dressing/Undressing What is the patient wearing?: Pull over shirt/dress Pull over shirt/dress - Perfomed by patient: Thread/unthread right sleeve, Thread/unthread left sleeve, Put head through opening, Pull shirt over trunk Assist Level: Supervision or verbal cues Function - Lower Body Dressing/Undressing What is the patient wearing?: Underwear, Pants, Socks, Shoes Position: Wheelchair/chair at [employer] - Performed by patient: Thread/unthread right underwear leg, Thread/unthread left underwear leg, Pull underwear up/down Pants- Performed by patient: Thread/unthread right pants leg, Thread/unthread left pants leg, Pull pants up/down Non-skid slipper socks- Performed by patient: Don/doff right sock, Don/doff left sock Socks - Performed by patient: Don/doff left sock Socks - Performed by helper: Don/doff right sock Shoes - Performed by patient: Don/doff left shoe, Fasten right, Fasten left Shoes - Performed by helper: Don/doff right shoe Assist for footwear: Setup Assist for lower body dressing: Touching or steadying assistance (Pt > 75%) Set up : To obtain clothing/put away  Function - Toileting Toileting steps completed by patient: Adjust clothing prior to toileting, Performs perineal hygiene, Adjust clothing after toileting Toileting steps completed by helper: Adjust clothing prior to toileting Toileting Assistive Devices: Grab bar or rail Assist level: Touching or steadying assistance (Pt.75%)  Function - Toilet Transfers Toilet transfer  assistive device: Walker, Grab bar Assist level to toilet: Touching or steadying assistance (Pt >  75%) Assist level from toilet: Touching or steadying assistance (Pt > 75%)  Function - Chair/bed transfer Chair/bed transfer method: Ambulatory, Stand pivot Chair/bed transfer assist level: Touching or steadying assistance (Pt > 75%) Chair/bed transfer assistive device: Walker, Armrests Chair/bed transfer details: Verbal cues for technique, Verbal cues for precautions/safety, Tactile cues for placement  Function - Locomotion: Wheelchair Will patient use wheelchair at discharge?: No Wheelchair activity did not occur: N/A Function - Locomotion: Ambulation Assistive device: Walker-rolling Max distance: 250 Assist level: Touching or steadying assistance (Pt > 75%) Assist level: Touching or steadying assistance (Pt > 75%) Assist level: Touching or steadying assistance (Pt > 75%) Assist level: Touching or steadying assistance (Pt > 75%) Assist level: Touching or steadying assistance (Pt > 75%)  Function - Comprehension Comprehension: Auditory Comprehension assist level: Understands basic 75 - 89% of the time/ requires cueing 10 - 24% of the time  Function - Expression Expression: Verbal Expression assist level: Expresses basic 75 - 89% of the time/requires cueing 10 - 24% of the time. Needs helper to occlude trach/needs to repeat words.  Function - Social Interaction Social Interaction assist level: Interacts appropriately 90% of the time - Needs monitoring or encouragement for participation or interaction.  Function - Problem Solving Problem solving assist level: Solves basic 50 - 74% of the time/requires cueing 25 - 49% of the time  Function - Memory Memory assist level: Recognizes or recalls 90% of the time/requires cueing < 10% of the time Patient normally able to recall (first 3 days only): Current season, Location of own room, Staff names and faces, That he or she is in a  hospital  Medical Problem List and Plan: 1.  Poor safety awareness, limitations with self-care, balance deficits secondary to left basal ganglial hemorrhage.  Cont CIR 2.  DVT Prophylaxis/Anticoagulation: Pharmaceutical: Lovenox, without evidence of bleeding  3. Pain Management: N/A 4. Mood: LCSW to follow for evaluation and support 5. Neuropsych: This patient is not fully capable of making decisions on his own behalf. 6. Skin/Wound Care: Maintain adequate nutritional and hydration status. Routine pressure relief measures.   7. Fluids/Electrolytes/Nutrition: Monitor I/O.  8. HTN: Monitor BP bid-continue catapres  Lisinopril increased to 30 on 2/10.   Will consider further increase tomorrow 9. Tobacco/ cocaine abuse: continue to educate on importance of cessation.  CXR 2/9 reviewed showing improvedment in right infiltrate and left ?atelectasis.  Will order repeat this week for follow up 10. Large left sided hydrocele: follow up with urology after discharge.  11. B 12 deficiency with anemia:Question anemia of chronic disease-- B 12 level- 136. Supplemented. Added iron and monitor.  12. CKD: Baseline SCr -1.3. Continue to monitor with serial checks.   2/8 lab at baseline  Labs ordered for tomorrow   LOS (Days) 4 A FACE TO FACE EVALUATION WAS PERFORMED  Ankit Karis JubaAnil Patel 04/12/2016, 9:00 AM

## 2016-04-13 ENCOUNTER — Inpatient Hospital Stay (HOSPITAL_COMMUNITY): Payer: Medicare Other | Admitting: Occupational Therapy

## 2016-04-13 ENCOUNTER — Inpatient Hospital Stay (HOSPITAL_COMMUNITY): Payer: Medicare Other | Admitting: Speech Pathology

## 2016-04-13 ENCOUNTER — Ambulatory Visit (HOSPITAL_REHABILITATION): Payer: Medicare Other | Admitting: Psychology

## 2016-04-13 ENCOUNTER — Inpatient Hospital Stay (HOSPITAL_COMMUNITY): Payer: Medicare Other | Admitting: Physical Therapy

## 2016-04-13 DIAGNOSIS — R9389 Abnormal findings on diagnostic imaging of other specified body structures: Secondary | ICD-10-CM

## 2016-04-13 DIAGNOSIS — G8190 Hemiplegia, unspecified affecting unspecified side: Secondary | ICD-10-CM

## 2016-04-13 DIAGNOSIS — N182 Chronic kidney disease, stage 2 (mild): Secondary | ICD-10-CM

## 2016-04-13 DIAGNOSIS — F331 Major depressive disorder, recurrent, moderate: Secondary | ICD-10-CM

## 2016-04-13 DIAGNOSIS — I619 Nontraumatic intracerebral hemorrhage, unspecified: Secondary | ICD-10-CM

## 2016-04-13 LAB — CBC WITH DIFFERENTIAL/PLATELET
BASOS ABS: 0 10*3/uL (ref 0.0–0.1)
Basophils Relative: 1 %
Eosinophils Absolute: 0.1 10*3/uL (ref 0.0–0.7)
Eosinophils Relative: 3 %
HCT: 36.6 % — ABNORMAL LOW (ref 39.0–52.0)
HEMOGLOBIN: 11.6 g/dL — AB (ref 13.0–17.0)
LYMPHS PCT: 54 %
Lymphs Abs: 2.3 10*3/uL (ref 0.7–4.0)
MCH: 25.4 pg — ABNORMAL LOW (ref 26.0–34.0)
MCHC: 31.7 g/dL (ref 30.0–36.0)
MCV: 80.1 fL (ref 78.0–100.0)
Monocytes Absolute: 0.5 10*3/uL (ref 0.1–1.0)
Monocytes Relative: 11 %
NEUTROS PCT: 31 %
Neutro Abs: 1.3 10*3/uL — ABNORMAL LOW (ref 1.7–7.7)
Platelets: 261 10*3/uL (ref 150–400)
RBC: 4.57 MIL/uL (ref 4.22–5.81)
RDW: 15.9 % — ABNORMAL HIGH (ref 11.5–15.5)
WBC: 4.1 10*3/uL (ref 4.0–10.5)

## 2016-04-13 LAB — BASIC METABOLIC PANEL
Anion gap: 10 (ref 5–15)
BUN: 15 mg/dL (ref 6–20)
CHLORIDE: 105 mmol/L (ref 101–111)
CO2: 20 mmol/L — ABNORMAL LOW (ref 22–32)
Calcium: 9.3 mg/dL (ref 8.9–10.3)
Creatinine, Ser: 1.35 mg/dL — ABNORMAL HIGH (ref 0.61–1.24)
GFR, EST AFRICAN AMERICAN: 59 mL/min — AB (ref >60)
GFR, EST NON AFRICAN AMERICAN: 51 mL/min — AB (ref >60)
Glucose, Bld: 95 mg/dL (ref 65–99)
POTASSIUM: 4.2 mmol/L (ref 3.5–5.1)
SODIUM: 135 mmol/L (ref 135–145)

## 2016-04-13 MED ORDER — AMLODIPINE BESYLATE 5 MG PO TABS
5.0000 mg | ORAL_TABLET | Freq: Every day | ORAL | Status: DC
  Administered 2016-04-13 – 2016-04-18 (×6): 5 mg via ORAL
  Filled 2016-04-13 (×6): qty 1

## 2016-04-13 NOTE — Progress Notes (Signed)
Speech Language Pathology Daily Session Note  Patient Details  Name: Andrew AbbottJohn Cuevas MRN: [id number] Date of Birth: December 19, 1945  Today's Date: 04/13/2016 SLP Individual Time: 0930-1015 SLP Individual Time Calculation (min): 45 min  Short Term Goals: Week 1: SLP Short Term Goal 1 (Week 1): Patient will demonstrate functional problem solving with Min A verbal cues.  SLP Short Term Goal 2 (Week 1): Patient will demonstrate recall of new information with Min A multimodal cues.  SLP Short Term Goal 3 (Week 1): Patient will complete generative naming tasks with Min A verbal cues.  SLP Short Term Goal 4 (Week 1): Patient will verbally communicate wants/needs at the sentence level with Min A verbal cues to self-monitor and correct verbal errors.  SLP Short Term Goal 5 (Week 1): Patient will follow multi-step commnads with supervision verbal cues.   Skilled Therapeutic Interventions:Skilled treatment session focused on cognitive goals. Upon arrival, patient was awake while supine in bed. SLP facilitated session by providing Min A verbal cues for problem solving for safety while donning pants and shoes and during transfer to the wheelchair with the RW. Patient asking when he can go home and reported he "is setting his own date for Wednesday." Patient informed that is not how discharging planning works while here on rehab. Patient also performed a basic money management task and required more than a reasonable amount of time and Mod A verbal and visual cues for problem solving. Patient left upright in wheelchair with quick release belt in place and all needs within reach. Continue with current plan of care.      Function:    Cognition Comprehension Comprehension assist level: Understands basic 75 - 89% of the time/ requires cueing 10 - 24% of the time  Expression   Expression assist level: Expresses basic 75 - 89% of the time/requires cueing 10 - 24% of the time. Needs helper to occlude trach/needs to  repeat words.  Social Interaction Social Interaction assist level: Interacts appropriately 90% of the time - Needs monitoring or encouragement for participation or interaction.  Problem Solving Problem solving assist level: Solves basic 50 - 74% of the time/requires cueing 25 - 49% of the time  Memory Memory assist level: Recognizes or recalls 90% of the time/requires cueing < 10% of the time    Pain Pain Assessment Pain Assessment: No/denies pain  Therapy/Group: Individual Therapy  Andrew Cuevas 04/13/2016, 12:21 PM

## 2016-04-13 NOTE — Progress Notes (Signed)
Subjective/Complaints: Pt seen laying in bed this AM.  He slept well overnight.  He wants to confirm that he is going home on Thursday.  Will speak with case manager.  Review of systems: Denies chest pain, shortness of breath, nausea, vomiting, bowel or bladder issues.  Objective: Vital Signs: Blood pressure (!) 166/87, pulse 60, temperature 98 F (36.7 C), temperature source Oral, resp. rate 20, height 5\' 10"  (1.778 m), weight 100.6 kg (221 lb 12.8 oz), SpO2 96 %. No results found. No results found for this or any previous visit (from the past 72 hour(s)).    General: No acute distress. Vital signs reviewed. Psych: Mood and affect are appropriate Heart: RRR. No JVD. Lungs: Clear to auscultation, breathing unlabored Abdomen: Positive bowel sounds, soft  Skin: No evidence of breakdown, no evidence of rash Neurologic: Alert Motor 5/5 in left deltoid, bicep, tricep, grip, hip flexor, knee extensors, ankle dorsiflexor and plantar flexor 4+/5 right side with dysmetria (stable) Musculoskeletal: No edema. No tenderness.   Assessment/Plan: 1. Functional deficits secondary to acute Left Basal ganglia ICH which require 3+ hours per day of interdisciplinary therapy in a comprehensive inpatient rehab setting. Physiatrist is providing close team supervision and 24 hour management of active medical problems listed below. Physiatrist and rehab team continue to assess barriers to discharge/monitor patient progress toward functional and medical goals. FIM: Function - Bathing Position: Wheelchair/chair at sink Body parts bathed by patient: Right arm, Left arm, Chest, Abdomen, Front perineal area, Right upper leg, Right lower leg, Left lower leg Body parts bathed by helper: Back Assist Level: Touching or steadying assistance(Pt > 75%)  Function- Upper Body Dressing/Undressing What is the patient wearing?: Pull over shirt/dress Pull over shirt/dress - Perfomed by patient: Thread/unthread right  sleeve, Thread/unthread left sleeve, Put head through opening, Pull shirt over trunk Assist Level: Set up Function - Lower Body Dressing/Undressing What is the patient wearing?: Pants, Socks, Shoes Position: Wheelchair/chair at sink Underwear - Performed by patient: Thread/unthread right underwear leg, Thread/unthread left underwear leg, Pull underwear up/down Pants- Performed by patient: Thread/unthread right pants leg, Thread/unthread left pants leg, Pull pants up/down Non-skid slipper socks- Performed by patient: Don/doff right sock, Don/doff left sock Socks - Performed by patient: Don/doff right sock, Don/doff left sock Socks - Performed by helper: Don/doff right sock Shoes - Performed by patient: Don/doff left shoe, Fasten right, Fasten left, Don/doff right shoe Shoes - Performed by helper: Don/doff right shoe Assist for footwear: Supervision/touching assist Assist for lower body dressing: Touching or steadying assistance (Pt > 75%) Set up : To obtain clothing/put away  Function - Toileting Toileting steps completed by patient: Adjust clothing prior to toileting, Performs perineal hygiene, Adjust clothing after toileting Toileting steps completed by helper: Adjust clothing prior to toileting Toileting Assistive Devices: Grab bar or rail Assist level: Touching or steadying assistance (Pt.75%)  Function - Toilet Transfers Toilet transfer assistive device: Walker, Grab bar Assist level to toilet: Touching or steadying assistance (Pt > 75%) Assist level from toilet: Touching or steadying assistance (Pt > 75%)  Function - Chair/bed transfer Chair/bed transfer method: Ambulatory Chair/bed transfer assist level: Touching or steadying assistance (Pt > 75%) Chair/bed transfer assistive device: Walker, Armrests Chair/bed transfer details: Verbal cues for technique, Verbal cues for precautions/safety, Tactile cues for placement  Function - Locomotion: Wheelchair Will patient use  wheelchair at discharge?: No Wheelchair activity did not occur: N/A Function - Locomotion: Ambulation Assistive device: Walker-rolling Max distance: 250 Assist level: Touching or steadying assistance (Pt > 75%)  Assist level: Touching or steadying assistance (Pt > 75%) Assist level: Touching or steadying assistance (Pt > 75%) Assist level: Touching or steadying assistance (Pt > 75%) Assist level: Touching or steadying assistance (Pt > 75%)  Function - Comprehension Comprehension: Auditory Comprehension assist level: Understands basic 75 - 89% of the time/ requires cueing 10 - 24% of the time  Function - Expression Expression: Verbal Expression assist level: Expresses basic 75 - 89% of the time/requires cueing 10 - 24% of the time. Needs helper to occlude trach/needs to repeat words.  Function - Social Interaction Social Interaction assist level: Interacts appropriately 90% of the time - Needs monitoring or encouragement for participation or interaction.  Function - Problem Solving Problem solving assist level: Solves basic 50 - 74% of the time/requires cueing 25 - 49% of the time  Function - Memory Memory assist level: Recognizes or recalls 90% of the time/requires cueing < 10% of the time Patient normally able to recall (first 3 days only): Current season, Location of own room, Staff names and faces, That he or she is in a hospital  Medical Problem List and Plan: 1.  Poor safety awareness, limitations with self-care, balance deficits secondary to left basal ganglial hemorrhage.  Cont CIR 2.  DVT Prophylaxis/Anticoagulation: Pharmaceutical: Lovenox, without evidence of bleeding  3. Pain Management: N/A 4. Mood: LCSW to follow for evaluation and support 5. Neuropsych: This patient is not fully capable of making decisions on his own behalf. 6. Skin/Wound Care: Maintain adequate nutritional and hydration status. Routine pressure relief measures.   7. Fluids/Electrolytes/Nutrition:  Monitor I/O.  8. HTN: Monitor BP bid-continue catapres  Lisinopril increased to 30 on 2/10.   Norvasc 5 started 2/12  Will cont to monitor 9. Tobacco/ cocaine abuse: continue to educate on importance of cessation.  CXR 2/9 reviewed showing improvement in right infiltrate and left ?atelectasis.    CXR ordered for tomorrow 10. Large left sided hydrocele: follow up with urology after discharge.  11. B 12 deficiency with anemia:Question anemia of chronic disease-- B 12 level- 136. Supplemented. Added iron and monitor.  12. CKD II: Baseline SCr -1.3. Continue to monitor with serial checks.   2/8 lab at baseline  Labs pending   LOS (Days) 5 A FACE TO FACE EVALUATION WAS PERFORMED  Idania Desouza Karis JubaAnil Sandeep Radell 04/13/2016, 9:07 AM

## 2016-04-13 NOTE — Progress Notes (Signed)
Occupational Therapy Session Note  Patient Details  Name: Andrew Cuevas MRN: [id number] Date of Birth: 10-Feb-1946  Today's Date: 04/13/2016 OT Individual Time: [id number] OT Individual Time Calculation (min): 70 min    Short Term Goals: Week 1:  OT Short Term Goal 1 (Week 1): Pt will complete toilet transfers with LRAD with supervision OT Short Term Goal 2 (Week 1): Pt will complete bathing at sit > stand level with supervision OT Short Term Goal 3 (Week 1): Pt will complete LB dressing with min assist at sit > stand level OT Short Term Goal 4 (Week 1): Pt will complete 3 grooming tasks in standing with supervision  Skilled Therapeutic Interventions/Progress Updates:    Treatment session with focus on functional mobility, standing tolerance, and Rt attention and functional use of RUE.  Pt received in bed willing to participate in treatment session.  Ambulated to bathroom without AD with min assist for stability as pt reaching outside BOS to stabilize on doorframes and grab bars.  Engaged in Rt attention and standing balance during [employer] activity.  Engaged in 3 trials of 60 seconds with pt demonstrating average reaction time 2.03 seconds with Rt hemisphere slower by 0.4 to 0.6 seconds compared to Lt.  Pt demonstrating preference to utilize LUE solely throughout activity, requiring cues and encouragement to incorporate RUE.  Increased challenge to include cognitive recall with pt to press Red lights with RUE and green lights with LUE.  Pt making approx 10 errors, utilizing incorrect hand requiring cues to return to "Right/red".  Overall reaction time slower due to increased cognitive demand.  Pt able to maintain standing for 2 mins each trial with supervision and no UE support.  Engaged in seated task with letter magnets to continue to address Rt inattention and visual scanning.  Pt to spell words with selected letters, however unable to spell even familiar words like "car" and "water" beginning  correctly but often attempting to insert "S".  Pt self-selecting to spell his name.  Returned to room ambulating with RW with min guard - supervision.  Therapy Documentation Precautions:  Precautions Precautions: Fall Restrictions Weight Bearing Restrictions: No Pain:  Pt with no c/o pain  See Function Navigator for Current Functional Status.   Therapy/Group: Individual Therapy  Rosalio LoudHOXIE, Floyde Dingley 04/13/2016, 3:17 PM

## 2016-04-13 NOTE — Progress Notes (Signed)
Physical Therapy Session Note  Patient Details  Name: Andrew Cuevas MRN: [id number] Date of Birth: 1945-12-28  Today's Date: 04/13/2016 PT Individual Time: 1515-1630 PT Individual Time Calculation (min): 75 min   Short Term Goals: Week 1:  PT Short Term Goal 1 (Week 1): pt will ambulate 200' with LRAD and min guard PT Short Term Goal 2 (Week 1): Pt will transfer with supervision and LRAD  Skilled Therapeutic Interventions/Progress Updates:    no c/o pain, session focus on balance, NMR, activity tolerance, safety awareness, transfers, and gait without device.    Pt ambulates to therapy gym without AD to encourage upright posture, min assist and mod multimodal cues for posture, foot clearance, step length, R attention, and not reaching for objects for balance.    NMR in standing with toe taps and criss cross toe taps to 4" box 4 trials each to fatigue with seated rest breaks between trials.  Pt requires min assist and min cues for weight shift and for foot clearance on box.  Balance and NMR for ankle strategy and balance during standing card matching activity with mod fade to min cues for matching cards correctly.    Seated activity focus on LUE use, organization, sequencing, and following directions to match 2 patterns of colored pegs with mod cues to correct errors.    Pt returned to room at end of session with HHA and positioned in bed with call bell in reach and needs met.   Therapy Documentation Precautions:  Precautions Precautions: Fall Restrictions Weight Bearing Restrictions: No  See Function Navigator for Current Functional Status.   Therapy/Group: Individual Therapy  Andrew Cuevas 04/13/2016, 3:44 PM

## 2016-04-13 NOTE — Progress Notes (Signed)
Patient:   Andrew AbbottJohn Cuevas   DOB:   Jun 09, 1945  MR Number:  [id number]  [facility]  [facility] [address] [id number] [place] [id number] Dept: [phone] Loc: [phone]           Date of Service:   04/13/2016  Start Time:   11 AM End Time:   11:45 AM  Provider/Observer:  Hershal CoriaJohn R Rodenbough PSYD       Billing Code/Service: [phone]  Chief Complaint:     Chief Complaint  Patient presents with  . Depression    Reason for Service:  The patient was referred for psychological/neurpsychological consultation due to coping issues and concerns about depression related to both the patient's own medical/physical status as well as dealing with the recent death of his wife.    Current Status:  The patient is moderately depressed.  This is likely true even before his recent vascular event.  Reliability of Information: Reliable.  From medical records, communication with staff and 1 hour with patient.  Behavioral Observation: Andrew AbbottJohn Cuevas  presents as a 71 y.o.-year-old Right African American Male who appeared his stated age. his dress was Appropriate and he was Well Groomed and his manners were Appropriate to the situation.  his participation was indicative of Appropriate behaviors.  There were  physical disabilities noted.  he displayed an appropriate level of cooperation and motivation.     Interactions:    Minimal The patient was open in his discussion during visit and opened up about his feelings of loss of wife and stress of coping.  Attention:   within normal limits and attention span appeared shorter than expected for age  Memory:   within normal limits; recent and remote memory intact  Visuo-spatial:  within normal limits  Speech (Volume):  low  Speech:   slurred; garbled but coherent.  I could understand him, but he was very quite and had some likely life long articulation errors.  Thought  Process:  Coherent  Though Content:  WNL;   Orientation:   person, place, time/date and situation  Judgment:   Fair  Planning:   Fair  Affect:    Depressed and Flat  Mood:    Depressed  Insight:   Fair  Intelligence:   normal  Marital Status/Living: The patient will be going back to living with sister.  His wife pasted away few years ago.  Current Employment: The patient is not working now.  Retired.  Substance Use:  There is a documented history of cocaine abuse confirmed by drug screen.    Education:   HS Graduate  Medical History:   Past Medical History:  Diagnosis Date  . Chronic headaches   . CVA (cerebral vascular accident) (HCC)   . HTN (hypertension), malignant         Facility-Administered Encounter Medications as of 04/13/2016  Medication  . acetaminophen (TYLENOL) tablet 325-650 mg  . alum & mag hydroxide-simeth (MAALOX/MYLANTA) 200-200-20 MG/5ML suspension 30 mL  . amLODipine (NORVASC) tablet 5 mg  . bisacodyl (DULCOLAX) suppository 10 mg  . cloNIDine (CATAPRES) tablet 0.1 mg  . cloNIDine (CATAPRES) tablet 0.1 mg  . diphenhydrAMINE (BENADRYL) 12.5 MG/5ML elixir 12.5-25 mg  . enoxaparin (LOVENOX) injection 40 mg  . guaiFENesin-dextromethorphan (ROBITUSSIN DM) 100-10 MG/5ML syrup 5-10 mL  . lisinopril (PRINIVIL,ZESTRIL) tablet 30 mg  . pantoprazole (PROTONIX) EC tablet 40 mg  . polyethylene glycol (MIRALAX / GLYCOLAX) packet 17 g  . prochlorperazine (  COMPAZINE) tablet 5-10 mg   Or  . prochlorperazine (COMPAZINE) injection 5-10 mg   Or  . prochlorperazine (COMPAZINE) suppository 12.5 mg  . senna-docusate (Senokot-S) tablet 1 tablet  . sodium phosphate (FLEET) 7-19 GM/118ML enema 1 enema  . traZODone (DESYREL) tablet 25-50 mg   Outpatient Encounter Prescriptions as of 04/13/2016  Medication Sig  . acetaminophen (TYLENOL) 325 MG tablet Take 650 mg by mouth every 6 (six) hours as needed for mild pain.  . cloNIDine (CATAPRES) 0.1 MG tablet Take 1 tablet  (0.1 mg total) by mouth 2 (two) times daily.  Marland Kitchen lisinopril (PRINIVIL,ZESTRIL) 20 MG tablet Take 1 tablet (20 mg total) by mouth daily.          Sexual History:   History  Sexual Activity  . Sexual activity: Not on file    Family Med/Psych History:  Family History  Problem Relation Age of Onset  . Hypertension Mother   . Arthritis Mother   . Alzheimer's disease Father     Risk of Suicide/Violence: virtually non-existent Patient denies SI or HI, however, he did score a moderate level of depression on the Beck Depression Inventory.  He did deny SI on the inventory and to direct questions.  Impression/DX:  The patient does appear to have a moderate level of depression and also endorsed Mild depressive symptoms on BDI.  He tested positive of Cocaine on Drug screen but denies use.  He has not been coping well with death of wife.  Disposition/Plan:  Worked on psychotherapeutic interventions and will see him again this week.  Diagnosis:    Hemorrhagic stroke (HCC)  Moderate episode of recurrent major depressive disorder (HCC)         Electronically Signed   _______________________ Arley Phenix, Psy.D.

## 2016-04-14 ENCOUNTER — Inpatient Hospital Stay (HOSPITAL_COMMUNITY): Payer: Medicare Other | Admitting: Speech Pathology

## 2016-04-14 ENCOUNTER — Inpatient Hospital Stay (HOSPITAL_COMMUNITY): Payer: Medicare Other | Admitting: Occupational Therapy

## 2016-04-14 ENCOUNTER — Inpatient Hospital Stay (HOSPITAL_COMMUNITY): Payer: Medicare Other | Admitting: Physical Therapy

## 2016-04-14 ENCOUNTER — Inpatient Hospital Stay (HOSPITAL_COMMUNITY): Payer: Medicare Other

## 2016-04-14 DIAGNOSIS — F329 Major depressive disorder, single episode, unspecified: Secondary | ICD-10-CM

## 2016-04-14 DIAGNOSIS — D62 Acute posthemorrhagic anemia: Secondary | ICD-10-CM

## 2016-04-14 MED ORDER — FLUOXETINE HCL 10 MG PO CAPS
10.0000 mg | ORAL_CAPSULE | Freq: Every day | ORAL | Status: DC
  Administered 2016-04-14 – 2016-04-18 (×5): 10 mg via ORAL
  Filled 2016-04-14 (×5): qty 1

## 2016-04-14 NOTE — Progress Notes (Signed)
Speech Language Pathology Daily Session Note  Patient Details  Name: Andrew Cuevas MRN: [id number] Date of Birth: [birth year]  Today's Date: 04/14/2016 SLP Individual Time: 1345-1425 SLP Individual Time Calculation (min): 40 min  Short Term Goals: Week 1: SLP Short Term Goal 1 (Week 1): Patient will demonstrate functional problem solving with Min A verbal cues.  SLP Short Term Goal 2 (Week 1): Patient will demonstrate recall of new information with Min A multimodal cues.  SLP Short Term Goal 3 (Week 1): Patient will complete generative naming tasks with Min A verbal cues.  SLP Short Term Goal 4 (Week 1): Patient will verbally communicate wants/needs at the sentence level with Min A verbal cues to self-monitor and correct verbal errors.  SLP Short Term Goal 5 (Week 1): Patient will follow multi-step commnads with supervision verbal cues.   Skilled Therapeutic Interventions: Skilled treatment session focused on cognitive goals. SLP facilitated session by providing total A for recall of his current medications and their function and Max A verbal and visual cues for problem solving while organizing a 2 time per day pill box. Patient also recalled events from previous therapy sessions and PT strategies to maximize safety with ambulation with Mod A verbal cues. Patient left upright in wheelchair with all needs within reach and quick release belt in place. Continue with current plan of care.      Function:   Cognition Comprehension Comprehension assist level: Understands basic 75 - 89% of the time/ requires cueing 10 - 24% of the time  Expression   Expression assist level: Expresses basic 75 - 89% of the time/requires cueing 10 - 24% of the time. Needs helper to occlude trach/needs to repeat words.  Social Interaction Social Interaction assist level: Interacts appropriately 90% of the time - Needs monitoring or encouragement for participation or interaction.  Problem Solving Problem solving assist  level: Solves basic 50 - 74% of the time/requires cueing 25 - 49% of the time  Memory Memory assist level: Recognizes or recalls 50 - 74% of the time/requires cueing 25 - 49% of the time    Pain No/Denies Pain   Therapy/Group: Individual Therapy  Meriam Chojnowski 04/14/2016, 3:38 PM

## 2016-04-14 NOTE — Progress Notes (Signed)
Physical Therapy Session Note  Patient Details  Name: Andrew Cuevas MRN: [id number] Date of Birth: 1945/10/14  Today's Date: 04/14/2016 PT Individual Time: 1100-1200 PT Individual Time Calculation (min): 60 min   Short Term Goals: Week 1:  PT Short Term Goal 1 (Week 1): pt will ambulate 200' with LRAD and min guard PT Short Term Goal 2 (Week 1): Pt will transfer with supervision and LRAD  Skilled Therapeutic Interventions/Progress Updates:    no c/o pain, session focus on NMR, gait training, transfers, strengthening, and activity tolerance.  Pt continues to require max>total cues throughout session for safe mobility.    Gait training to therapy gym with RW, 1 seated rest break, with supervision and max cues for foot clearance, step length, walker positioning, and upright posture.  Pt not able to carry over cues unless they were constantly given.  Transfers throughout session with RW, sit<>stand, stand/pivot, and ambulatory requiring max cues for hand placement and forward weight shift for every transfer.   NMR via stair training with alternating pattern (3" steps skipping step) for increased step length, hip flexion, and attention to foot clearance.  2x8 steps with 2 rails and steady assist.  NMR on kinetron 2 trials to fatigue in sitting at 25 cm/s with max to total cues for increasing ROM and stepping speed.  2 trials in standing on kinetron focus on increasing ROM and maintaining upright posture with max multimodal cues.  NMR in sitting with heel taps to 6" box 3 trials to fatigue focus on hip flexion and DF.    Gait training back to room in same manner as above, continues to require max cues for foot clearance, step length, walker positioning, and upright posture.  Left upright in w/c with QRB in place, call bell in reach and needs met.    Therapy Documentation Precautions:  Precautions Precautions: Fall Restrictions Weight Bearing Restrictions: No   See Function Navigator for Current  Functional Status.   Therapy/Group: Individual Therapy  Deshone Lyssy E Penven-Crew 04/14/2016, 11:48 AM

## 2016-04-14 NOTE — Plan of Care (Signed)
Problem: RH SAFETY Goal: RH STG ADHERE TO SAFETY PRECAUTIONS W/ASSISTANCE/DEVICE STG Adhere to Safety Precautions With Mod.Assistance/Device.  Outcome: Not Progressing Impulsive- will transfer self Goal: RH STG DECREASED RISK OF FALL WITH ASSISTANCE STG Decreased Risk of Fall With Mod.Assistance.  Outcome: Not Progressing impulsive

## 2016-04-14 NOTE — Progress Notes (Signed)
Occupational Therapy Session Note  Patient Details  Name: Carlis AbbottJohn Cuevas MRN: [id number] Date of Birth: 11/14/1945  Today's Date: 04/14/2016 OT Individual Time: 8413-24400937-1032 and 1027-25361445-1515 OT Individual Time Calculation (min): 55 min and 30 min    Short Term Goals: Week 1:  OT Short Term Goal 1 (Week 1): Pt will complete toilet transfers with LRAD with supervision OT Short Term Goal 2 (Week 1): Pt will complete bathing at sit > stand level with supervision OT Short Term Goal 3 (Week 1): Pt will complete LB dressing with min assist at sit > stand level OT Short Term Goal 4 (Week 1): Pt will complete 3 grooming tasks in standing with supervision  Skilled Therapeutic Interventions/Progress Updates:    1) Treatment session with focus on ADL retraining with functional mobility, transfers, and bathing and dressing.  Pt required max multimodal cues for sequencing with transfers with RW due to decreased attention to Rt and safety awareness with mobility.  Completed bathing at sit > stand level in room shower with setup assist and supervision/ no cues.  Ambulated back to bed post shower for dressing without use of RW with increased posture but continued shuffling of feet Rt > Lt.  Completed dressing with supervision.  Ambulated to therapy gym with improved sequencing with RW and proximity to RW during ambulation in staightaways.  Engaged in 3D pipe tree puzzle in standing with focus on visual scanning to Rt as well as use of BUE to increase functional use of RUE and attention to Rt.  Pt demonstrating increased difficulty with sequencing task as he fatigued as well as when working in Risk analystt visual field.  Returned to room via w/c and left upright with quick release belt donned and all needs in reach.  2) Treatment session with focus on functional ambulation and trunk control.  Pt donned boots this session, improvement with ambulation with decreased shuffling gait when wearing boots.  Ambulated to therapy gym with RW  with max verbal cues for safety and positioning of RW.  Engaged in trunk rotation and bridging in supine with focus on disassociation of hips and trunk control to improve standing posture.  Bimanual shoulder flexion in supine to increase attention to RUE during ROM and functional reaching.  Returned to room ambulating without AD with improved upright posture and stepping pattern with supervision.  As pt fatigued, ambulated > 150 feet, pt began to drag Rt foot and required min cues to attend to Rt foot during ambulation.  Therapy Documentation Precautions:  Precautions Precautions: Fall Restrictions Weight Bearing Restrictions: No General:   Vital Signs: Therapy Vitals Temp: 98.1 F (36.7 C) Temp Source: Oral Pulse Rate: 61 Resp: 20 BP: 129/84 Patient Position (if appropriate): Sitting Oxygen Therapy SpO2: 90 % O2 Device: Not Delivered Pain:  Pt with no c/o pain  See Function Navigator for Current Functional Status.   Therapy/Group: Individual Therapy  Rosalio LoudHOXIE, Braylei Totino 04/14/2016, 3:29 PM

## 2016-04-14 NOTE — Progress Notes (Signed)
Subjective/Complaints: Pt seen laying in bed this AM.  He slept well overnight.  He denies complaints this AM.    Review of systems: Denies chest pain, shortness of breath, nausea, vomiting, bowel or bladder issues.  Objective: Vital Signs: Blood pressure (!) 145/89, pulse 62, temperature 98.2 F (36.8 C), temperature source Oral, resp. rate 18, height 5' 10"  (1.778 m), weight 100.6 kg (221 lb 12.8 oz), SpO2 97 %. Dg Chest 2 View  Result Date: 04/14/2016 CLINICAL DATA:  Follow-up right basilar opacity. EXAM: CHEST  2 VIEW COMPARISON:  Chest x-ray of April 10, 2016 FINDINGS: The lungs are mildly hyperinflated. There is persistent increased density in the right infrahilar region which appears to project in the right lower lobe. The left lung is clear. The heart is top-normal in size. The pulmonary vascularity is normal. There is tortuosity of the ascending and descending thoracic aorta. There is no pleural effusion. IMPRESSION: Persistent increased density in the right infrahilar region likely in the lower lobe consistent with atelectasis or pneumonia. Mild interstitial prominence throughout both lungs is stable and likely reflects the patient's smoking history and or COPD. Electronically Signed   By: David  Martinique M.D.   On: 04/14/2016 07:15   Results for orders placed or performed during the hospital encounter of 04/08/16 (from the past 72 hour(s))  Basic metabolic panel     Status: Abnormal   Collection Time: 04/13/16  2:16 PM  Result Value Ref Range   Sodium 135 135 - 145 mmol/L   Potassium 4.2 3.5 - 5.1 mmol/L   Chloride 105 101 - 111 mmol/L   CO2 20 (L) 22 - 32 mmol/L   Glucose, Bld 95 65 - 99 mg/dL   BUN 15 6 - 20 mg/dL   Creatinine, Ser 1.35 (H) 0.61 - 1.24 mg/dL   Calcium 9.3 8.9 - 10.3 mg/dL   GFR calc non Af Amer 51 (L) >60 mL/min   GFR calc Af Amer 59 (L) >60 mL/min    Comment: (NOTE) The eGFR has been calculated using the CKD EPI equation. This calculation has not been  validated in all clinical situations. eGFR's persistently <60 mL/min signify possible Chronic Kidney Disease.    Anion gap 10 5 - 15  CBC with Differential/Platelet     Status: Abnormal   Collection Time: 04/13/16  2:16 PM  Result Value Ref Range   WBC 4.1 4.0 - 10.5 K/uL   RBC 4.57 4.22 - 5.81 MIL/uL   Hemoglobin 11.6 (L) 13.0 - 17.0 g/dL   HCT 36.6 (L) 39.0 - 52.0 %   MCV 80.1 78.0 - 100.0 fL   MCH 25.4 (L) 26.0 - 34.0 pg   MCHC 31.7 30.0 - 36.0 g/dL   RDW 15.9 (H) 11.5 - 15.5 %   Platelets 261 150 - 400 K/uL   Neutrophils Relative % 31 %   Neutro Abs 1.3 (L) 1.7 - 7.7 K/uL   Lymphocytes Relative 54 %   Lymphs Abs 2.3 0.7 - 4.0 K/uL   Monocytes Relative 11 %   Monocytes Absolute 0.5 0.1 - 1.0 K/uL   Eosinophils Relative 3 %   Eosinophils Absolute 0.1 0.0 - 0.7 K/uL   Basophils Relative 1 %   Basophils Absolute 0.0 0.0 - 0.1 K/uL      General: NAD. Vital signs reviewed. Psych: Mood and affect are appropriate Heart: RRR. No JVD. Lungs: Clear to auscultation, breathing unlabored Abdomen: Positive bowel sounds, soft  Skin: No evidence of breakdown, no evidence of rash  Neurologic: Alert Motor 5/5 in left deltoid, bicep, tricep, grip, hip flexor, knee extensors, ankle dorsiflexor and plantar flexor 4+/5 right side with dysmetria (improving) Musculoskeletal: No edema. No tenderness.   Assessment/Plan: 1. Functional deficits secondary to acute Left Basal ganglia ICH which require 3+ hours per day of interdisciplinary therapy in a comprehensive inpatient rehab setting. Physiatrist is providing close team supervision and 24 hour management of active medical problems listed below. Physiatrist and rehab team continue to assess barriers to discharge/monitor patient progress toward functional and medical goals. FIM: Function - Bathing Position: Wheelchair/chair at sink Body parts bathed by patient: Right arm, Left arm, Chest, Abdomen, Front perineal area, Right upper leg, Right  lower leg, Left lower leg Body parts bathed by helper: Back Assist Level: Touching or steadying assistance(Pt > 75%)  Function- Upper Body Dressing/Undressing What is the patient wearing?: Pull over shirt/dress Pull over shirt/dress - Perfomed by patient: Thread/unthread right sleeve, Thread/unthread left sleeve, Put head through opening, Pull shirt over trunk Assist Level: Set up Function - Lower Body Dressing/Undressing What is the patient wearing?: Pants, Socks, Shoes Position: Wheelchair/chair at sink Underwear - Performed by patient: Thread/unthread right underwear leg, Thread/unthread left underwear leg, Pull underwear up/down Pants- Performed by patient: Thread/unthread right pants leg, Thread/unthread left pants leg, Pull pants up/down Non-skid slipper socks- Performed by patient: Don/doff right sock, Don/doff left sock Socks - Performed by patient: Don/doff right sock, Don/doff left sock Socks - Performed by helper: Don/doff right sock Shoes - Performed by patient: Don/doff left shoe, Fasten right, Fasten left, Don/doff right shoe Shoes - Performed by helper: Don/doff right shoe Assist for footwear: Supervision/touching assist Assist for lower body dressing: Touching or steadying assistance (Pt > 75%) Set up : To obtain clothing/put away  Function - Toileting Toileting steps completed by patient: Adjust clothing prior to toileting, Performs perineal hygiene, Adjust clothing after toileting Toileting steps completed by helper: Adjust clothing prior to toileting [place]: Grab bar or rail Assist level: Touching or steadying assistance (Pt.75%)  Function - Toilet Transfers Toilet transfer assistive device: Walker, Grab bar Assist level to toilet: Touching or steadying assistance (Pt > 75%) Assist level from toilet: Touching or steadying assistance (Pt > 75%)  Function - Chair/bed transfer Chair/bed transfer method: Ambulatory Chair/bed transfer assist  level: Touching or steadying assistance (Pt > 75%) Chair/bed transfer assistive device: Walker, Armrests Chair/bed transfer details: Verbal cues for technique, Verbal cues for precautions/safety, Tactile cues for placement  Function - Locomotion: Wheelchair Will patient use wheelchair at discharge?: No Wheelchair activity did not occur: N/A Function - Locomotion: Ambulation Assistive device: No device Max distance: 200 Assist level: Touching or steadying assistance (Pt > 75%) Assist level: Touching or steadying assistance (Pt > 75%) Assist level: Touching or steadying assistance (Pt > 75%) Assist level: Touching or steadying assistance (Pt > 75%) Assist level: Touching or steadying assistance (Pt > 75%)  Function - Comprehension Comprehension: Auditory Comprehension assist level: Understands basic 75 - 89% of the time/ requires cueing 10 - 24% of the time  Function - Expression Expression: Verbal Expression assist level: Expresses basic 75 - 89% of the time/requires cueing 10 - 24% of the time. Needs helper to occlude trach/needs to repeat words.  Function - Social Interaction Social Interaction assist level: Interacts appropriately 90% of the time - Needs monitoring or encouragement for participation or interaction.  Function - Problem Solving Problem solving assist level: Solves basic 50 - 74% of the time/requires cueing 25 - 49% of the  time  Function - Memory Memory assist level: Recognizes or recalls 90% of the time/requires cueing < 10% of the time Patient normally able to recall (first 3 days only): Current season, Location of own room, Staff names and faces, That he or she is in a hospital  Medical Problem List and Plan: 1.  Poor safety awareness, limitations with self-care, balance deficits secondary to left basal ganglial hemorrhage.  Cont CIR 2.  DVT Prophylaxis/Anticoagulation: Pharmaceutical: Lovenox, without evidence of bleeding  3. Pain Management: N/A 4. Mood:  LCSW to follow for evaluation and support  Prozaac started 2/13 5. Neuropsych: This patient is not fully capable of making decisions on his own behalf.  Spoke with Neuropsych, appreciate recs- Mild/Mod depression.   6. Skin/Wound Care: Maintain adequate nutritional and hydration status. Routine pressure relief measures.   7. Fluids/Electrolytes/Nutrition: Monitor I/O.  8. HTN: Monitor BP bid-continue catapres  Lisinopril increased to 30 on 2/10.   Norvasc 5 started 2/12  Appears to be improving 9. Tobacco/ cocaine abuse: continue to educate on importance of cessation.  CXR 2/9 reviewed showing improvement in right infiltrate and left ?atelectasis.    Repeat CXR 2/13 reviewed, stable from previous, likely atelectasis, no signs/symptoms of infection at present.  10. Large left sided hydrocele: follow up with urology after discharge.  11. B 12 deficiency with anemia:Question anemia of chronic disease-- B 12 level- 136. Supplemented. Added iron and monitor.  12. CKD II: Baseline SCr -1.3. Continue to monitor with serial checks.   2/12 lab at baseline 13. ABLA  Hb 11.6 on 2/12   LOS (Days) 6 A FACE TO FACE EVALUATION WAS PERFORMED  Ankit Lorie Phenix 04/14/2016, 9:17 AM

## 2016-04-15 ENCOUNTER — Inpatient Hospital Stay (HOSPITAL_COMMUNITY): Payer: Medicare Other | Admitting: Physical Therapy

## 2016-04-15 ENCOUNTER — Inpatient Hospital Stay (HOSPITAL_COMMUNITY): Payer: Medicare Other | Admitting: Occupational Therapy

## 2016-04-15 ENCOUNTER — Inpatient Hospital Stay (HOSPITAL_COMMUNITY): Payer: Medicare Other | Admitting: Speech Pathology

## 2016-04-15 ENCOUNTER — Inpatient Hospital Stay (HOSPITAL_COMMUNITY): Payer: Medicare Other | Admitting: *Deleted

## 2016-04-15 LAB — BASIC METABOLIC PANEL
Anion gap: 9 (ref 5–15)
BUN: 14 mg/dL (ref 6–20)
CHLORIDE: 105 mmol/L (ref 101–111)
CO2: 22 mmol/L (ref 22–32)
Calcium: 9.4 mg/dL (ref 8.9–10.3)
Creatinine, Ser: 1.33 mg/dL — ABNORMAL HIGH (ref 0.61–1.24)
GFR calc Af Amer: >60 mL/min (ref >60)
GFR calc non Af Amer: 52 mL/min — ABNORMAL LOW (ref >60)
Glucose, Bld: 92 mg/dL (ref 65–99)
POTASSIUM: 4.2 mmol/L (ref 3.5–5.1)
SODIUM: 136 mmol/L (ref 135–145)

## 2016-04-15 LAB — CBC
HEMATOCRIT: 35.3 % — AB (ref 39.0–52.0)
HEMOGLOBIN: 11.3 g/dL — AB (ref 13.0–17.0)
MCH: 25.6 pg — ABNORMAL LOW (ref 26.0–34.0)
MCHC: 32 g/dL (ref 30.0–36.0)
MCV: 79.9 fL (ref 78.0–100.0)
Platelets: 259 10*3/uL (ref 150–400)
RBC: 4.42 MIL/uL (ref 4.22–5.81)
RDW: 16.2 % — ABNORMAL HIGH (ref 11.5–15.5)
WBC: 3.1 10*3/uL — AB (ref 4.0–10.5)

## 2016-04-15 NOTE — Progress Notes (Addendum)
Subjective/Complaints: Pt seen sitting at edge of bed this AM.  He slept well overnight and is looking forward to going home tomorrow (per patient).   Review of systems: Denies chest pain, shortness of breath, nausea, vomiting, bowel or bladder issues.  Objective: Vital Signs: Blood pressure 108/62, pulse 79, temperature 98 F (36.7 C), temperature source Oral, resp. rate 18, height 5\' 10"  (1.778 m), weight 94.5 kg (208 lb 6.4 oz), SpO2 98 %. Dg Chest 2 View  Result Date: 04/14/2016 CLINICAL DATA:  Follow-up right basilar opacity. EXAM: CHEST  2 VIEW COMPARISON:  Chest x-ray of April 10, 2016 FINDINGS: The lungs are mildly hyperinflated. There is persistent increased density in the right infrahilar region which appears to project in the right lower lobe. The left lung is clear. The heart is top-normal in size. The pulmonary vascularity is normal. There is tortuosity of the ascending and descending thoracic aorta. There is no pleural effusion. IMPRESSION: Persistent increased density in the right infrahilar region likely in the lower lobe consistent with atelectasis or pneumonia. Mild interstitial prominence throughout both lungs is stable and likely reflects the patient's smoking history and or COPD. Electronically Signed   By: David  April 12, 2016 M.D.   On: 04/14/2016 07:15   Results for orders placed or performed during the hospital encounter of 04/08/16 (from the past 72 hour(s))  Basic metabolic panel     Status: Abnormal   Collection Time: 04/13/16  2:16 PM  Result Value Ref Range   Sodium 135 135 - 145 mmol/L   Potassium 4.2 3.5 - 5.1 mmol/L   Chloride 105 101 - 111 mmol/L   CO2 20 (L) 22 - 32 mmol/L   Glucose, Bld 95 65 - 99 mg/dL   BUN 15 6 - 20 mg/dL   Creatinine, Ser 06/11/16 (H) 0.61 - 1.24 mg/dL   Calcium 9.3 8.9 - 1.36 mg/dL   GFR calc non Af Amer 51 (L) >60 mL/min   GFR calc Af Amer 59 (L) >60 mL/min    Comment: (NOTE) The eGFR has been calculated using the CKD EPI equation. This  calculation has not been validated in all clinical situations. eGFR's persistently <60 mL/min signify possible Chronic Kidney Disease.    Anion gap 10 5 - 15  CBC with Differential/Platelet     Status: Abnormal   Collection Time: 04/13/16  2:16 PM  Result Value Ref Range   WBC 4.1 4.0 - 10.5 K/uL   RBC 4.57 4.22 - 5.81 MIL/uL   Hemoglobin 11.6 (L) 13.0 - 17.0 g/dL   HCT 06/11/16 (L) 81.1 - 28.4 %   MCV 80.1 78.0 - 100.0 fL   MCH 25.4 (L) 26.0 - 34.0 pg   MCHC 31.7 30.0 - 36.0 g/dL   RDW 61.5 (H) 94.9 - 93.1 %   Platelets 261 150 - 400 K/uL   Neutrophils Relative % 31 %   Neutro Abs 1.3 (L) 1.7 - 7.7 K/uL   Lymphocytes Relative 54 %   Lymphs Abs 2.3 0.7 - 4.0 K/uL   Monocytes Relative 11 %   Monocytes Absolute 0.5 0.1 - 1.0 K/uL   Eosinophils Relative 3 %   Eosinophils Absolute 0.1 0.0 - 0.7 K/uL   Basophils Relative 1 %   Basophils Absolute 0.0 0.0 - 0.1 K/uL  Basic metabolic panel     Status: Abnormal   Collection Time: 04/15/16  5:55 AM  Result Value Ref Range   Sodium 136 135 - 145 mmol/L   Potassium 4.2 3.5 -  5.1 mmol/L   Chloride 105 101 - 111 mmol/L   CO2 22 22 - 32 mmol/L   Glucose, Bld 92 65 - 99 mg/dL   BUN 14 6 - 20 mg/dL   Creatinine, Ser 1.33 (H) 0.61 - 1.24 mg/dL   Calcium 9.4 8.9 - 10.3 mg/dL   GFR calc non Af Amer 52 (L) >60 mL/min   GFR calc Af Amer >60 >60 mL/min    Comment: (NOTE) The eGFR has been calculated using the CKD EPI equation. This calculation has not been validated in all clinical situations. eGFR's persistently <60 mL/min signify possible Chronic Kidney Disease.    Anion gap 9 5 - 15  CBC     Status: Abnormal   Collection Time: 04/15/16  5:55 AM  Result Value Ref Range   WBC 3.1 (L) 4.0 - 10.5 K/uL   RBC 4.42 4.22 - 5.81 MIL/uL   Hemoglobin 11.3 (L) 13.0 - 17.0 g/dL   HCT 35.3 (L) 39.0 - 52.0 %   MCV 79.9 78.0 - 100.0 fL   MCH 25.6 (L) 26.0 - 34.0 pg   MCHC 32.0 30.0 - 36.0 g/dL   RDW 16.2 (H) 11.5 - 15.5 %   Platelets 259 150 - 400  K/uL      General: NAD. Vital signs reviewed. Psych: Mood and affect are appropriate Heart: RRR. No JVD. Lungs: Clear to auscultation, breathing unlabored Abdomen: Positive bowel sounds, soft  Skin: No evidence of breakdown, no evidence of rash Neurologic: Alert Motor 5/5 in left deltoid, bicep, tricep, grip, hip flexor, knee extensors, ankle dorsiflexor and plantar flexor 4+/5 right side with dysmetria (improving) Musculoskeletal: No edema. No tenderness.   Assessment/Plan: 1. Functional deficits secondary to acute Left Basal ganglia ICH which require 3+ hours per day of interdisciplinary therapy in a comprehensive inpatient rehab setting. Physiatrist is providing close team supervision and 24 hour management of active medical problems listed below. Physiatrist and rehab team continue to assess barriers to discharge/monitor patient progress toward functional and medical goals. FIM: Function - Bathing Position: Shower Body parts bathed by patient: Right arm, Left arm, Chest, Abdomen, Front perineal area, Buttocks, Right upper leg, Left upper leg, Right lower leg, Left lower leg, Back Body parts bathed by helper: Back Assist Level: Set up Set up : To obtain items  Function- Upper Body Dressing/Undressing What is the patient wearing?: Pull over shirt/dress Pull over shirt/dress - Perfomed by patient: Thread/unthread right sleeve, Thread/unthread left sleeve, Put head through opening, Pull shirt over trunk Assist Level: More than reasonable time Function - Lower Body Dressing/Undressing What is the patient wearing?: Underwear, Pants, Shoes Position: Sitting EOB Underwear - Performed by patient: Thread/unthread right underwear leg, Thread/unthread left underwear leg, Pull underwear up/down Pants- Performed by patient: Thread/unthread right pants leg, Thread/unthread left pants leg, Pull pants up/down Non-skid slipper socks- Performed by patient: Don/doff right sock, Don/doff left  sock Socks - Performed by patient: Don/doff right sock, Don/doff left sock Socks - Performed by helper: Don/doff right sock Shoes - Performed by patient: Don/doff right shoe, Don/doff left shoe Shoes - Performed by helper: Don/doff right shoe Assist for footwear: Supervision/touching assist Assist for lower body dressing: Supervision or verbal cues Set up : To obtain clothing/put away  Function - Toileting Toileting steps completed by patient: Adjust clothing prior to toileting, Performs perineal hygiene, Adjust clothing after toileting Toileting steps completed by helper: Adjust clothing prior to toileting [place]: Grab bar or rail Assist level: Touching or steadying assistance (  Pt.75%)  Function - Air cabin crew transfer assistive device: Walker, Grab bar Assist level to toilet: Touching or steadying assistance (Pt > 75%) Assist level from toilet: Touching or steadying assistance (Pt > 75%)  Function - Chair/bed transfer Chair/bed transfer method: Ambulatory Chair/bed transfer assist level: Supervision or verbal cues Chair/bed transfer assistive device: Armrests, Walker Chair/bed transfer details: Verbal cues for technique, Verbal cues for precautions/safety, Tactile cues for sequencing, Tactile cues for weight shifting, Tactile cues for posture  Function - Locomotion: Wheelchair Will patient use wheelchair at discharge?: No Wheelchair activity did not occur: N/A Function - Locomotion: Ambulation Assistive device: Walker-rolling Max distance: 175 Assist level: Supervision or verbal cues Assist level: Supervision or verbal cues Assist level: Supervision or verbal cues Assist level: Supervision or verbal cues Assist level: Touching or steadying assistance (Pt > 75%)  Function - Comprehension Comprehension: Auditory Comprehension assist level: Understands basic 75 - 89% of the time/ requires cueing 10 - 24% of the time  Function -  Expression Expression: Verbal Expression assist level: Expresses basic 75 - 89% of the time/requires cueing 10 - 24% of the time. Needs helper to occlude trach/needs to repeat words.  Function - Social Interaction Social Interaction assist level: Interacts appropriately 90% of the time - Needs monitoring or encouragement for participation or interaction.  Function - Problem Solving Problem solving assist level: Solves basic 50 - 74% of the time/requires cueing 25 - 49% of the time  Function - Memory Memory assist level: Recognizes or recalls 50 - 74% of the time/requires cueing 25 - 49% of the time Patient normally able to recall (first 3 days only): Current season, Location of own room, Staff names and faces, That he or she is in a hospital  Medical Problem List and Plan: 1.  Poor safety awareness, limitations with self-care, balance deficits secondary to left basal ganglial hemorrhage.  Cont CIR 2.  DVT Prophylaxis/Anticoagulation: Pharmaceutical: Lovenox, without evidence of bleeding  3. Pain Management: N/A 4. Mood: LCSW to follow for evaluation and support  Prozaac started 2/13 5. Neuropsych: This patient is not fully capable of making decisions on his own behalf.  Spoke with Neuropsych, appreciate recs- Mild/Mod depression.   6. Skin/Wound Care: Maintain adequate nutritional and hydration status. Routine pressure relief measures.   7. Fluids/Electrolytes/Nutrition: Monitor I/O.  8. HTN: Monitor BP bid-continue catapres  Lisinopril increased to 30 on 2/10.   Norvasc 5 started 2/12  Labile, but overall improved 9. Tobacco/ cocaine abuse: continue to educate on importance of cessation.    CXR 2/9 reviewed showing improvement in right infiltrate and left ?atelectasis.  Repeat CXR 2/13 reviewed, stable from previous, likely atelectasis, no signs/symptoms of infection at present.  10. Large left sided hydrocele: follow up with urology after discharge.  11. B 12 deficiency with  anemia:Question anemia of chronic disease-- B 12 level- 136. Supplemented. Added iron and monitor.  12. CKD II: Baseline SCr -1.3. Continue to monitor with serial checks.   2/14 lab at baseline 13. ABLA  Hb 11.3 on 2/14   LOS (Days) 7 A FACE TO FACE EVALUATION WAS PERFORMED  Brett Darko Lorie Phenix 04/15/2016, 9:10 AM

## 2016-04-15 NOTE — Progress Notes (Signed)
Physical Therapy Session Note  Patient Details  Name: Andrew Cuevas MRN: [id number] Date of Birth: 08/06/1945  Today's Date: 04/15/2016 PT Individual Time: 1000-1045 PT Individual Time Calculation (min): 45 min   Short Term Goals: Week 1:  PT Short Term Goal 1 (Week 1): pt will ambulate 200' with LRAD and min guard PT Short Term Goal 2 (Week 1): Pt will transfer with supervision and LRAD  Skilled Therapeutic Interventions/Progress Updates:  Pt presented in gym after hand off from SLP. Gait training 185 ft no AD, frequent cues for increased R foot clearance and increased R step length. Performed balance/coordination activities incl toe taps/alternating steps. NuStep L3 x 8 min for increased reciprocal activity. Functional activities performed sit to/from stand transfers w/c to/from mat with mod cues for sequencing and increased safety. Pt returned to rooom via w/c and remained in w/c with call bell and QRB in place and all current needs met.       Therapy Documentation Precautions:  Precautions Precautions: Fall Restrictions Weight Bearing Restrictions: No General:   Vital Signs: Therapy Vitals Temp: 98 F (36.7 C) Temp Source: Oral Pulse Rate: 65 Resp: 20 BP: 127/63 Patient Position (if appropriate): Lying Oxygen Therapy SpO2: 98 % O2 Device: Not Delivered   See Function Navigator for Current Functional Status.   Therapy/Group: Individual Therapy  Adaleen Hulgan  Sharnese Heath, PTA  04/15/2016, 4:14 PM

## 2016-04-15 NOTE — Progress Notes (Signed)
Social Work Patient ID: Andrew Cuevas, male   DOB: 07-Jan-1946, 71 y.o.   MRN: [id number]  Met with pt and spoke with sister via telephone to inform of team conference goals supervision level and target  discharge date 2/17. Have scheduled sister to come in tomorrow @ 10:00 to see him in therapies. This worker wants sister to see what she will need to do to assist hm at home. Will see sister tomorrow and see if questions Or concerns.

## 2016-04-15 NOTE — Progress Notes (Signed)
Occupational Therapy Session Note  Patient Details  Name: Andrew AbbottJohn Cuevas MRN: [id number] Date of Birth: 1946-01-27  Today's Date: 04/15/2016 OT Individual Time: [id number] and 1420-1500 OT Individual Time Calculation (min): 42 min and 40 min   Short Term Goals: Week 1:  OT Short Term Goal 1 (Week 1): Pt will complete toilet transfers with LRAD with supervision OT Short Term Goal 2 (Week 1): Pt will complete bathing at sit > stand level with supervision OT Short Term Goal 3 (Week 1): Pt will complete LB dressing with min assist at sit > stand level OT Short Term Goal 4 (Week 1): Pt will complete 3 grooming tasks in standing with supervision  Skilled Therapeutic Interventions/Progress Updates:    1) Treatment session with focus on d/c planning, awareness, memory, and Rt attention.  Pt received seated at EOB finishing breakfast.  Pt reports fatigue and not wanting to leave room for therapy session.  Pt declined bathing but willing to discuss d/c plan, progress towards goals, and engage in activity to address attention and awareness.  Utilized "what's wrong" and "what's missing" cards to engage in conversation regarding intellectual and emergent awareness regarding safety concerns at home.  Pt required mod question cues throughout "what's wrong" activity to further detect and discuss safety concerns and awareness.  Pt demonstrating increase visual scanning and attention in "what's missing" activity easily identifying 2/3 items in each scenario, requiring mod question cues to locate 3rd.  Pt requesting to return to bed throughout session, therefore returned to supine and left pt with all needs in reach.  2) Treatment session with focus on functional mobility in home environment.  Pt donned shoes seated EOB without assistance.  Ambulated to ADL apt with supervision, except for min assist when turning corner into apt as pt crossing over RLE and beginning to lose balance to Rt with decreased awareness and  ability to correct.  Educated on use of tub transfer bench for increased safety with shower transfers with pt able to return demonstration with supervision.  Discussed recommendation for obtaining grab bars for shower with discussion regarding installation vs suction cup grab bars.  Completed 8 3" steps x2 with bilateral rails for strengthening with pt able to complete with min cues for sequencing and safety due to small step length and getting heel caught on step frequently.  Returned to room as above.  Therapy Documentation Precautions:  Precautions Precautions: Fall Restrictions Weight Bearing Restrictions: No General:   Vital Signs: Therapy Vitals Temp: 98 F (36.7 C) Temp Source: Oral Pulse Rate: 79 Resp: 18 BP: 108/62 Patient Position (if appropriate): Lying Oxygen Therapy SpO2: 98 % O2 Device: Not Delivered Pain:  Pt with no c/o pain  See Function Navigator for Current Functional Status.   Therapy/Group: Individual Therapy  Rosalio LoudHOXIE, Andrei Mccook 04/15/2016, 8:42 AM

## 2016-04-15 NOTE — Progress Notes (Signed)
Speech Language Pathology Daily Session Note  Patient Details  Name: Andrew AbbottJohn Cuevas MRN: [id number] Date of Birth: 08/19/1945  Today's Date: 04/15/2016 SLP Individual Time: [id number] SLP Individual Time Calculation (min): 55 min  Short Term Goals: Week 1: SLP Short Term Goal 1 (Week 1): Patient will demonstrate functional problem solving with Min A verbal cues.  SLP Short Term Goal 2 (Week 1): Patient will demonstrate recall of new information with Min A multimodal cues.  SLP Short Term Goal 3 (Week 1): Patient will complete generative naming tasks with Min A verbal cues.  SLP Short Term Goal 4 (Week 1): Patient will verbally communicate wants/needs at the sentence level with Min A verbal cues to self-monitor and correct verbal errors.  SLP Short Term Goal 5 (Week 1): Patient will follow multi-step commnads with supervision verbal cues.   Skilled Therapeutic Interventions: Skilled treatment session focused on cognitive goals. Upon arrival, patient was asleep in bed but was easily awakened. Patient required encouragement for participation but eventually was agreeable. SLP facilitated session by providing Mod-Max A verbal cues for safety and problem solving with self-care tasks, especially in regards to transfers. Patient also participated in a mildly complex, novel task ans required Mod A verbal cues for recall of procedures to task with use of external aids and for problem solving. Patient handed off to PT at end of session. Continue with current plan of care.      Function:  Cognition Comprehension Comprehension assist level: Understands basic 75 - 89% of the time/ requires cueing 10 - 24% of the time  Expression   Expression assist level: Expresses basic 75 - 89% of the time/requires cueing 10 - 24% of the time. Needs helper to occlude trach/needs to repeat words.  Social Interaction Social Interaction assist level: Interacts appropriately 90% of the time - Needs monitoring or encouragement  for participation or interaction.  Problem Solving Problem solving assist level: Solves basic 50 - 74% of the time/requires cueing 25 - 49% of the time  Memory Memory assist level: Recognizes or recalls 50 - 74% of the time/requires cueing 25 - 49% of the time    Pain Pain Assessment Pain Assessment: No/denies pain  Therapy/Group: Individual Therapy  Andrew Cuevas 04/15/2016, 11:53 AM

## 2016-04-15 NOTE — Plan of Care (Signed)
Problem: RH BLADDER ELIMINATION Goal: RH STG MANAGE BLADDER WITH ASSISTANCE STG Manage Bladder With Min. Assistance  Outcome: Not Progressing Incontinent at night  Problem: RH SAFETY Goal: RH STG ADHERE TO SAFETY PRECAUTIONS W/ASSISTANCE/DEVICE STG Adhere to Safety Precautions With Mod.Assistance/Device.  Outcome: Not Progressing Got up unassisted

## 2016-04-16 ENCOUNTER — Inpatient Hospital Stay (HOSPITAL_COMMUNITY): Payer: Medicare Other | Admitting: Physical Therapy

## 2016-04-16 ENCOUNTER — Inpatient Hospital Stay (HOSPITAL_COMMUNITY): Payer: Medicare Other | Admitting: Occupational Therapy

## 2016-04-16 ENCOUNTER — Encounter (HOSPITAL_COMMUNITY): Payer: Medicare Other | Admitting: Speech Pathology

## 2016-04-16 NOTE — Progress Notes (Signed)
Physical Therapy Session Note  Patient Details  Name: Andrew Cuevas MRN: [id number] Date of Birth: [birth year]  Today's Date: 04/16/2016 PT Individual Time: 1100-1200 PT Individual Time Calculation (min): 60 min   Short Term Goals: Week 1:  PT Short Term Goal 1 (Week 1): pt will ambulate 200' with LRAD and min guard PT Short Term Goal 2 (Week 1): Pt will transfer with supervision and LRAD  Skilled Therapeutic Interventions/Progress Updates:    no c/o pain.  Session focus on family education with pt's sister, Andrew Cuevas, for all functional mobility and NMR for trunk elongation/shortening.    Pt ambulates throughout unit, max distance 200', with supervision and mod multimodal cues for step length, attention to R, and posture.  Stair negotiation 2x4 steps with R ascending rail with supervision provided from PT on first trial and Andrew Cuevas on second trial.  Andrew Cuevas ambulated with pt from therapy gym to ortho gym providing appropriate supervision and asking appropriate questions regarding pt's upcoming d/c.  PT demonstrated to [name] providing supervision for pt to complete car transfer with min cues for sitting before bringing LEs into.  Pt ambulates up/down ramp and across compliant surface with close supervision, 2 anterior LOB due to poor R foot clearance that pt corrects without assist from PT.  PT instructed pt in falls recovery with Andrew Cuevas observing and pt completes floor transfer with supervision.  Pt returned to room at end of session and positioned in bed with call bell in reach and needs met.   Therapy Documentation Precautions:  Precautions Precautions: Fall Restrictions Weight Bearing Restrictions: No   See Function Navigator for Current Functional Status.   Therapy/Group: Individual Therapy  [name] 04/16/2016, 12:31 PM

## 2016-04-16 NOTE — Patient Care Conference (Signed)
Inpatient RehabilitationTeam Conference and Plan of Care Update Date: 04/15/2016   Time: 11:30 AM    Patient Name: Andrew Cuevas      Medical Record Number: [id number]  Date of Birth: October 06, 1945 Sex: Male         Room/Bed: 4W01C/4W01C-01 Payor Info: Payor: [employer]vertising copy[employer] MEDICARE / Plan: [employer] MEDICARE / Product Type: *No Product type* /    Admitting Diagnosis: L ICH  Admit Date/Time:  04/08/2016  3:45 PM Admission Comments: No comment available   Primary Diagnosis:  <principal problem not specified> Principal Problem: <principal problem not specified>  Patient Active Problem List   Diagnosis Date Noted  . Reactive depression   . Infiltrate noted on imaging study   . Cough   . Abnormal chest x-ray   . Stage 3 chronic kidney disease   . Nontraumatic acute hemorrhage of basal ganglia (HCC) 04/08/2016  . Vitamin B 12 deficiency   . Cocaine abuse   . Right hemiparesis (HCC)   . Hemorrhagic stroke (HCC)   . Left hydrocele   . Tobacco abuse   . Diastolic dysfunction   . Stage 2 chronic kidney disease   . Benign essential HTN   . Chronic nonintractable headache   . History of CVA with residual deficit   . Acute blood loss anemia   . Oxygen desaturation   . Intractable hiccups   . ICH (intracerebral hemorrhage) (HCC)   . Stroke (cerebrum) (HCC) 04/02/2016  . Malignant hypertension 03/28/2016  . Hypertensive heart disease without heart failure 03/28/2016    Expected Discharge Date: Expected Discharge Date: 04/18/16  Team Members Present: Physician leading conference: Dr. Maryla Morrow Social Worker Present: Dossie Der, LCSW Nurse Present: Carmie End, RN PT Present: Teodoro Kil, PT;Rodney Leo Grosser, PT OT Present: Rosalio Loud, OT SLP Present: Feliberto Gottron, SLP PPS Coordinator present : Tora Duck, RN, CRRN     Current Status/Progress Goal Weekly Team Focus  Medical   Poor safety awareness, limitations with self-care, balance deficits secondary to left basal  ganglial hemorrhage  Improve safety, mobility, HTN  See above   Bowel/Bladder   continent Bowel and bladder at times/LBM 04/13/16  min assist  monitor bowel and bladder q shift   Swallow/Nutrition/ Hydration             ADL's   min guard ambulation/transfers without AD, supervision bathing/dressing  supervision overall  Rt attention, safety awareness, ADL retraining   Mobility   supervision for ambulation and transfers, needs constant cuing for safe mobility   mod I transfers, supervision gait  safety awareness, day to day recall, gait, transfers   Communication   Supervision  Supervision  Appears to be at baseline    Safety/Cognition/ Behavioral Observations  Mod A  min assist  recall and safety    Pain   no complaints of pain  pain less than or equal to 2  continue to monitor pain q shift   Skin   no skin breakdown  no skin breakdown this admission  assess skin q shift    Rehab Goals Patient on target to meet rehab goals: Yes *See Care Plan and progress notes for long and short-term goals.  Barriers to Discharge: Safety, mobility, depression, HTN    Possible Resolutions to Barriers:  Optimize BP meds, therapies, SSRI started    Discharge Planning/Teaching Needs:  Home with sister who can only provide supervision level due to her own health issues. Neuro-psych seeing for coping with CVA and loss of wife.  Team Discussion:  Goals supervision level-poor safety awareness and impulsive. Kidney function better and stable. Needs cues for walker safety. Sister to be in tomorrow for family education. Cont B & B. Being seen by neuro-psych  Revisions to Treatment Plan:  DC 2/17   Continued Need for Acute Rehabilitation Level of Care: The patient requires daily medical management by a physician with specialized training in physical medicine and rehabilitation for the following conditions: Daily direction of a multidisciplinary physical rehabilitation program to ensure safe  treatment while eliciting the highest outcome that is of practical value to the patient.: Yes Daily medical management of patient stability for increased activity during participation in an intensive rehabilitation regime.: Yes Daily analysis of laboratory values and/or radiology reports with any subsequent need for medication adjustment of medical intervention for : Neurological problems;Blood pressure problems;Mood/behavior problems  Jevaun Strick, Lemar LivingsRebecca G 04/16/2016, 8:59 AM

## 2016-04-16 NOTE — Progress Notes (Signed)
Recreational Therapy Session Note  Patient Details  Name: Andrew Cuevas MRN: [id number] Date of Birth: Jul 16, 1945 Today's Date: 04/16/2016  Pain:  No c/o  Met with pt to complete TR eval and pt declined participation stating he didn't do much PTA.  Education provided about the importance of staying active.  Pt stated understanding. [place] 04/16/2016, 4:23 PM

## 2016-04-16 NOTE — Progress Notes (Signed)
Occupational Therapy Session Note  Patient Details  Name: Carlis AbbottJohn Berrett MRN: [id number] Date of Birth: 1945/05/26  Today's Date: 04/16/2016 OT Individual Time: 0930-1030 OT Individual Time Calculation (min): 60 min    Short Term Goals: Week 1:  OT Short Term Goal 1 (Week 1): Pt will complete toilet transfers with LRAD with supervision OT Short Term Goal 2 (Week 1): Pt will complete bathing at sit > stand level with supervision OT Short Term Goal 3 (Week 1): Pt will complete LB dressing with min assist at sit > stand level OT Short Term Goal 4 (Week 1): Pt will complete 3 grooming tasks in standing with supervision  Skilled Therapeutic Interventions/Progress Updates:    Treatment session with focus on ADL retraining, functional transfers, Rt attention, and education with pt's sister, Alona BeneJoyce.  Completed bathing at overall supervision/setup level in standing in room shower.  Reiterated recommendation for use of tub bench for increased safety with pt reporting understanding for home setup.  Pt completed dressing with only setup assist.  Pt's sister arrived during session, engaged in education regarding Rt inattention, safety with ambulation, tub/shower transfers, and self-care tasks.  Pt demonstrated tub/shower transfers with use of tub transfer bench as well as discussed pros and cons of suction cup grab bar option with pt and sister to increase pt safety and independence.  Discussed recommendation for supervision with ambulation and setup for bathing as pt currently demonstrating safety and not wishing sister to be in bathroom during shower.  Pt returned to room ambulating with supervision with slowing pace as he fatigued and increased attention to obstacles in Rt field of vision.  Pt's sister providing verbal cues for obstacles and safety during transition to sitting for increased awareness and hand placement.  Therapy Documentation Precautions:  Precautions Precautions: Fall Restrictions Weight  Bearing Restrictions: No General:   Vital Signs: Therapy Vitals Temp: 97.5 F (36.4 C) Temp Source: Oral Pulse Rate: 77 Resp: 20 BP: (!) 142/60 Patient Position (if appropriate): Lying Oxygen Therapy SpO2: 98 % O2 Device: Not Delivered Pain:  Pt with no c/o pain  See Function Navigator for Current Functional Status.   Therapy/Group: Individual Therapy  Rosalio LoudHOXIE, Jaeceon Michelin 04/16/2016, 3:18 PM

## 2016-04-16 NOTE — Progress Notes (Signed)
Subjective/Complaints: Pt seen laying in bed this AM.  He slept well overnight.  He denies complaints this AM.    Review of systems: Denies chest pain, shortness of breath, nausea, vomiting, bowel or bladder issues.  Objective: Vital Signs: Blood pressure (!) 147/80, pulse 67, temperature 98 F (36.7 C), temperature source Oral, resp. rate 20, height _0  (1.778 m), weight 94.5 kg (208 lb 6.4 oz), SpO2 97 %. No results found. Results for orders placed or performed during the hospital encounter of 04/08/16 (from the past 72 hour(s))  Basic metabolic panel     Status: Abnormal   Collection Time: 04/13/16  2:16 PM  Result Value Ref Range   Sodium 135 135 - 145 mmol/L   Potassium 4.2 3.5 - 5.1 mmol/L   Chloride 105 101 - 111 mmol/L   CO2 20 (L) 22 - 32 mmol/L   Glucose, Bld 95 65 - 99 mg/dL   BUN 15 6 - 20 mg/dL   Creatinine, Ser 1.35 (H) 0.61 - 1.24 mg/dL   Calcium 9.3 8.9 - 10.3 mg/dL   GFR calc non Af Amer 51 (L) >60 mL/min   GFR calc Af Amer 59 (L) >60 mL/min    Comment: (NOTE) The eGFR has been calculated using the CKD EPI equation. This calculation has not been validated in all clinical situations. eGFR's persistently <60 mL/min signify possible Chronic Kidney Disease.    Anion gap 10 5 - 15  CBC with Differential/Platelet     Status: Abnormal   Collection Time: 04/13/16  2:16 PM  Result Value Ref Range   WBC 4.1 4.0 - 10.5 K/uL   RBC 4.57 4.22 - 5.81 MIL/uL   Hemoglobin 11.6 (L) 13.0 - 17.0 g/dL   HCT 36.6 (L) 39.0 - 52.0 %   MCV 80.1 78.0 - 100.0 fL   MCH 25.4 (L) 26.0 - 34.0 pg   MCHC 31.7 30.0 - 36.0 g/dL   RDW 15.9 (H) 11.5 - 15.5 %   Platelets 261 150 - 400 K/uL   Neutrophils Relative % 31 %   Neutro Abs 1.3 (L) 1.7 - 7.7 K/uL   Lymphocytes Relative 54 %   Lymphs Abs 2.3 0.7 - 4.0 K/uL   Monocytes Relative 11 %   Monocytes Absolute 0.5 0.1 - 1.0 K/uL   Eosinophils Relative 3 %   Eosinophils Absolute 0.1 0.0 - 0.7 K/uL   Basophils Relative 1 %   Basophils Absolute 0.0 0.0 - 0.1 K/uL  Basic metabolic panel     Status: Abnormal   Collection Time: 04/15/16  5:55 AM  Result Value Ref Range   Sodium 136 135 - 145 mmol/L   Potassium 4.2 3.5 - 5.1 mmol/L   Chloride 105 101 - 111 mmol/L   CO2 22 22 - 32 mmol/L   Glucose, Bld 92 65 - 99 mg/dL   BUN 14 6 - 20 mg/dL   Creatinine, Ser 1.33 (H) 0.61 - 1.24 mg/dL   Calcium 9.4 8.9 - 10.3 mg/dL   GFR calc non Af Amer 52 (L) >60 mL/min   GFR calc Af Amer >60 >60 mL/min    Comment: (NOTE) The eGFR has been calculated using the CKD EPI equation. This calculation has not been validated in all clinical situations. eGFR's persistently <60 mL/min signify possible Chronic Kidney Disease.    Anion gap 9 5 - 15  CBC     Status: Abnormal   Collection Time: 04/15/16  5:55 AM  Result Value Ref Range  WBC 3.1 (L) 4.0 - 10.5 K/uL   RBC 4.42 4.22 - 5.81 MIL/uL   Hemoglobin 11.3 (L) 13.0 - 17.0 g/dL   HCT 35.3 (L) 39.0 - 52.0 %   MCV 79.9 78.0 - 100.0 fL   MCH 25.6 (L) 26.0 - 34.0 pg   MCHC 32.0 30.0 - 36.0 g/dL   RDW 16.2 (H) 11.5 - 15.5 %   Platelets 259 150 - 400 K/uL      General: NAD. Vital signs reviewed. Psych: Mood and affect are appropriate Heart: RRR. No JVD. Lungs: Clear to auscultation, breathing unlabored Abdomen: Positive bowel sounds, soft  Skin: No evidence of breakdown, no evidence of rash Neurologic: Alert Motor 5/5 in left deltoid, bicep, tricep, grip, hip flexor, knee extensors, ankle dorsiflexor and plantar flexor 4+/5 right side with dysmetria (stable) Musculoskeletal: No edema. No tenderness.   Assessment/Plan: 1. Functional deficits secondary to acute Left Basal ganglia ICH which require 3+ hours per day of interdisciplinary therapy in a comprehensive inpatient rehab setting. Physiatrist is providing close team supervision and 24 hour management of active medical problems listed below. Physiatrist and rehab team continue to assess barriers to discharge/monitor  patient progress toward functional and medical goals. FIM: Function - Bathing Position: Shower Body parts bathed by patient: Right arm, Left arm, Chest, Abdomen, Front perineal area, Buttocks, Right upper leg, Left upper leg, Right lower leg, Left lower leg, Back Body parts bathed by helper: Back Assist Level: Set up Set up : To obtain items  Function- Upper Body Dressing/Undressing What is the patient wearing?: Pull over shirt/dress Pull over shirt/dress - Perfomed by patient: Thread/unthread right sleeve, Thread/unthread left sleeve, Put head through opening, Pull shirt over trunk Assist Level: More than reasonable time Function - Lower Body Dressing/Undressing What is the patient wearing?: Underwear, Pants, Shoes Position: Sitting EOB Underwear - Performed by patient: Thread/unthread right underwear leg, Thread/unthread left underwear leg, Pull underwear up/down Pants- Performed by patient: Thread/unthread right pants leg, Thread/unthread left pants leg, Pull pants up/down Non-skid slipper socks- Performed by patient: Don/doff right sock, Don/doff left sock Socks - Performed by patient: Don/doff right sock, Don/doff left sock Socks - Performed by helper: Don/doff right sock Shoes - Performed by patient: Don/doff right shoe, Don/doff left shoe Shoes - Performed by helper: Don/doff right shoe Assist for footwear: Supervision/touching assist Assist for lower body dressing: Supervision or verbal cues Set up : To obtain clothing/put away  Function - Toileting Toileting steps completed by patient: Adjust clothing prior to toileting, Performs perineal hygiene, Adjust clothing after toileting Toileting steps completed by helper: Adjust clothing prior to toileting [place]: Grab bar or rail Assist level: Touching or steadying assistance (Pt.75%)  Function - Toilet Transfers Toilet transfer assistive device: Walker, Grab bar Assist level to toilet: Touching or steadying  assistance (Pt > 75%) Assist level from toilet: Touching or steadying assistance (Pt > 75%)  Function - Chair/bed transfer Chair/bed transfer method: Ambulatory Chair/bed transfer assist level: Supervision or verbal cues Chair/bed transfer assistive device: Armrests, Walker Chair/bed transfer details: Verbal cues for technique, Verbal cues for precautions/safety, Tactile cues for sequencing, Tactile cues for weight shifting, Tactile cues for posture  Function - Locomotion: Wheelchair Will patient use wheelchair at discharge?: No Wheelchair activity did not occur: N/A Function - Locomotion: Ambulation Assistive device: Walker-rolling Max distance: 175 Assist level: Supervision or verbal cues Assist level: Supervision or verbal cues Assist level: Supervision or verbal cues Assist level: Supervision or verbal cues Assist level: Touching or steadying  assistance (Pt > 75%)  Function - Comprehension Comprehension: Auditory Comprehension assist level: Understands basic 75 - 89% of the time/ requires cueing 10 - 24% of the time  Function - Expression Expression: Verbal Expression assist level: Expresses basic 75 - 89% of the time/requires cueing 10 - 24% of the time. Needs helper to occlude trach/needs to repeat words.  Function - Social Interaction Social Interaction assist level: Interacts appropriately 90% of the time - Needs monitoring or encouragement for participation or interaction.  Function - Problem Solving Problem solving assist level: Solves basic 75 - 89% of the time/requires cueing 10 - 24% of the time  Function - Memory Memory assist level: Recognizes or recalls 75 - 89% of the time/requires cueing 10 - 24% of the time Patient normally able to recall (first 3 days only): Current season, Location of own room, Staff names and faces, That he or she is in a hospital  Medical Problem List and Plan: 1.  Poor safety awareness, limitations with self-care, balance deficits  secondary to left basal ganglial hemorrhage.  Cont CIR, progressing toward discharge 2.  DVT Prophylaxis/Anticoagulation: Pharmaceutical: Lovenox, without evidence of bleeding  3. Pain Management: N/A 4. Mood: LCSW to follow for evaluation and support  Prozaac started 2/13 5. Neuropsych: This patient is not fully capable of making decisions on his own behalf.  Spoke with Neuropsych, appreciate recs- Mild/Mod depression.   6. Skin/Wound Care: Maintain adequate nutritional and hydration status. Routine pressure relief measures.   7. Fluids/Electrolytes/Nutrition: Monitor I/O.  8. HTN: Monitor BP bid-continue catapres  Lisinopril increased to 30 on 2/10.   Norvasc 5 started 2/12  Labile, but overall improved 2/15 9. Tobacco/ cocaine abuse: continue to educate on importance of cessation.    CXR 2/9 reviewed showing improvement in right infiltrate and left ?atelectasis.  Repeat CXR 2/13 reviewed, stable from previous, likely atelectasis, no signs/symptoms of infection at present.  10. Large left sided hydrocele: follow up with urology after discharge.  11. B 12 deficiency with anemia:Question anemia of chronic disease-- B 12 level- 136. Supplemented. Added iron and monitor.  12. CKD II: Baseline SCr -1.3. Continue to monitor with serial checks.   2/14 lab at baseline 13. ABLA  Hb 11.3 on 2/14   LOS (Days) 8 A FACE TO FACE EVALUATION WAS PERFORMED  Andrew Cuevas Lorie Phenix 04/16/2016, 8:18 AM

## 2016-04-16 NOTE — Progress Notes (Signed)
Speech Language Pathology Daily Session Note  Patient Details  Name: Andrew Cuevas MRN: [id number] Date of Birth: Jan 24, 1946  Today's Date: 04/16/2016 SLP Individual Time: 1300-1345 SLP Individual Time Calculation (min): 45 min  Short Term Goals: Week 1: SLP Short Term Goal 1 (Week 1): Patient will demonstrate functional problem solving with Min A verbal cues.  SLP Short Term Goal 2 (Week 1): Patient will demonstrate recall of new information with Min A multimodal cues.  SLP Short Term Goal 3 (Week 1): Patient will complete generative naming tasks with Min A verbal cues.  SLP Short Term Goal 4 (Week 1): Patient will verbally communicate wants/needs at the sentence level with Min A verbal cues to self-monitor and correct verbal errors.  SLP Short Term Goal 5 (Week 1): Patient will follow multi-step commnads with supervision verbal cues.   Skilled Therapeutic Interventions: Skilled treatment session focused on cognitive goals. SLP facilitated session by providing extra time and Min A verbal cues for patient to attend to right field of environment while propelling his wheelchair to the dayroom. Patient participated in basic but novel tasks and required Min A verbal cues for problem solving and emergent awareness with difficulty of tasks. Patient was Mod I for use of word-finding at the conversation level. Patient handed off to NT. Continue with current plan of care.      Function:  Cognition Comprehension Comprehension assist level: Understands basic 75 - 89% of the time/ requires cueing 10 - 24% of the time  Expression   Expression assist level: Expresses basic 75 - 89% of the time/requires cueing 10 - 24% of the time. Needs helper to occlude trach/needs to repeat words.  Social Interaction Social Interaction assist level: Interacts appropriately 90% of the time - Needs monitoring or encouragement for participation or interaction.  Problem Solving Problem solving assist level: Solves basic 75 -  89% of the time/requires cueing 10 - 24% of the time  Memory Memory assist level: Recognizes or recalls 75 - 89% of the time/requires cueing 10 - 24% of the time    Pain No/Denies Pain   Therapy/Group: Individual Therapy  Nethra Mehlberg 04/16/2016, 3:21 PM

## 2016-04-16 NOTE — Progress Notes (Signed)
Physical Therapy Session Note  Patient Details  Name: Andrew Cuevas MRN: [id number] Date of Birth: 03/28/1945  Today's Date: 04/16/2016 PT Individual Time: 1500-1530 PT Individual Time Calculation (min): 30 min   Short Term Goals: Week 1:  PT Short Term Goal 1 (Week 1): pt will ambulate 200' with LRAD and min guard PT Short Term Goal 2 (Week 1): Pt will transfer with supervision and LRAD  Skilled Therapeutic Interventions/Progress Updates:    no c/o pain.  Session focus on activity tolerance and gait training.    Pt ambulates to/from [employer] gym with supervision.  Pt demos L lateral trunk lean and R shoulder elevation with decreased step length on R this session.  Pt states "it's my swagger," and is able to correct with verbal cues but not able to maintain upright posture without constant cuing.  Attempted ambulation with 3# wrist weight for improved feedback of posture but unsuccessful.  Pt completed 3 trials of dynavision, mode A, in standing for activity tolerance, visual scanning, sustained attention, and dynamic balance.  Pt scored 32>31>35 hits with reaction times 1.88s>1.94s>1.71s respectively.  Pt returned to room at end of session and positioned in bed with call bell in reach and needs met.    Therapy Documentation Precautions:  Precautions Precautions: Fall Restrictions Weight Bearing Restrictions: No   See Function Navigator for Current Functional Status.   Therapy/Group: Individual Therapy  [name] 04/16/2016, 4:11 PM

## 2016-04-16 NOTE — Progress Notes (Addendum)
Social Work Patient ID: Andrew Cuevas, male   DOB: [birth year], 71 y.o.   MRN: [id number]  Met with pt and sister who was here to be trained in therapies. She was pleased with how well he was doing and feels she can manage him at home. Discussed home health and sister wants to use [facility] since she had them after a TKR. Will work toward discharge Sat.

## 2016-04-17 ENCOUNTER — Inpatient Hospital Stay (HOSPITAL_COMMUNITY): Payer: Medicare Other | Admitting: Physical Therapy

## 2016-04-17 ENCOUNTER — Inpatient Hospital Stay (HOSPITAL_COMMUNITY): Payer: Medicare Other | Admitting: Occupational Therapy

## 2016-04-17 ENCOUNTER — Inpatient Hospital Stay (HOSPITAL_COMMUNITY): Payer: Medicare Other | Admitting: Speech Pathology

## 2016-04-17 MED ORDER — LISINOPRIL 30 MG PO TABS: 30.0000 mg | ORAL_TABLET | Freq: Every day | ORAL | 0 refills | Status: DC

## 2016-04-17 MED ORDER — SENNOSIDES-DOCUSATE SODIUM 8.6-50 MG PO TABS: 1.0000 | ORAL_TABLET | Freq: Two times a day (BID) | ORAL | 0 refills | Status: DC

## 2016-04-17 MED ORDER — AMLODIPINE BESYLATE 5 MG PO TABS: 5.0000 mg | ORAL_TABLET | Freq: Every day | ORAL | 0 refills | Status: DC

## 2016-04-17 MED ORDER — PANTOPRAZOLE SODIUM 40 MG PO TBEC: 40.0000 mg | DELAYED_RELEASE_TABLET | Freq: Every day | ORAL | 0 refills | Status: DC

## 2016-04-17 MED ORDER — CLONIDINE HCL 0.1 MG PO TABS: 0.1000 mg | ORAL_TABLET | Freq: Two times a day (BID) | ORAL | 0 refills | Status: DC

## 2016-04-17 MED ORDER — FLUOXETINE HCL 10 MG PO CAPS: 10.0000 mg | ORAL_CAPSULE | Freq: Every day | ORAL | 0 refills | Status: DC

## 2016-04-17 NOTE — Progress Notes (Signed)
Social Work  Discharge Note  The overall goal for the admission was met for:   Discharge location: Yes-HOME WITH SISTER WHO CAN PROVIDE 24 HR SUPERVISION  Length of Stay: Yes-10 DAYS  Discharge activity level: Yes-SUPERVISION LEVEL  Home/community participation: Yes  Services provided included: MD, RD, PT, OT, SLP, RN, CM, TR, Pharmacy, Neuropsych and SW  Financial Services: Private Insurance: [facility]  Follow-up services arranged: Home Health: [facility]-PT,OT,SP, DME: [facility]-TUB BENCH and Patient/Family request agency HH: [facility], DME: [facility] USED BEFORE Decided not to get tub bench sister will get on her own.  Comments (or additional information):SISTER WAS HERE Thursday TO GO THROUGH FAMILY TRAINING AND SEE WHAT ASSIST SHE NEEDS TO PROVIDE. SHE IS COMFORTABLE WITH HIS CARE AND READY FOR HIM TO COME HOME. PT DECLINED SUBSTANCE ABUSE RESOURCES,FEELS IS DONE WITH. GIVEN PT HOSPICE GRIEF COUNSELING RESOURCES.  Patient/Family verbalized understanding of follow-up arrangements: Yes  Individual responsible for coordination of the follow-up plan: [name]-SISTER  Confirmed correct DME delivered: Elease Hashimoto 04/17/2016    Elease Hashimoto

## 2016-04-17 NOTE — Progress Notes (Signed)
Speech Language Pathology Session Note & Discharge Summary  Patient Details  Name: Andrew Cuevas MRN: [id number] Date of Birth: 06/19/1945  Today's Date: 04/17/2016 SLP Individual Time: 6553-7482 SLP Individual Time Calculation (min): 45 min   Skilled Therapeutic Interventions:  Skilled treatment session focused on cognitive goals. SLP facilitated session by re-administering the MoCA (version 7.3). Patient scored 13/30 points with a score of 26 or above considered normal. Patient continues to demonstrate deficits in attention, problem solving and recall. However, patient has demonstrated increased ability to complete functional tasks safely. Patient left upright in wheelchair with all needs within reach. Continue with current plan of care.   Patient has met 5 of 6 long term goals.  Patient to discharge at overall Min;Mod level.   Reasons goals not met: Patient continues to require Min-Mod A verbal cues for functional problem solving with basic tasks    Clinical Impression/Discharge Summary: Patient has made functional gains and has met 5 of 6 LTG's this admission. Currently, patient can express his basic wants/needs with Mod I and can follow complex auditory tasks with supervision verbal cues. Patient also requires overall Min-Mod A verbal cues to complete functional and familiar tasks in regards to selective attention, recall with use of strategies, functional problem solving and emergent awareness of errors. Education has been completed with the patient, however, patient's sister left prior to completion of SLP education. Patient would benefit from f/u SLP services to maximize his cognitive function and overall functional independence in order to reduce caregiver burden.   Care Partner:  Caregiver Able to Provide Assistance: Yes  Type of Caregiver Assistance: Physical;Cognitive  Recommendation:  24 hour supervision/assistance;Home Health SLP  Rationale for SLP Follow Up: Reduce caregiver  burden;Maximize cognitive function and independence   Equipment: N/A   Reasons for discharge: Discharged from hospital   Patient/Family Agrees with Progress Made and Goals Achieved: Yes   Function:  Cognition Comprehension Comprehension assist level: Understands basic 90% of the time/cues < 10% of the time  Expression   Expression assist level: Expresses basic 90% of the time/requires cueing < 10% of the time.  Social Interaction Social Interaction assist level: Interacts appropriately 90% of the time - Needs monitoring or encouragement for participation or interaction.  Problem Solving Problem solving assist level: Solves basic 75 - 89% of the time/requires cueing 10 - 24% of the time  Memory Memory assist level: Recognizes or recalls 75 - 89% of the time/requires cueing 10 - 24% of the time   Andrew Cuevas 04/17/2016, 3:11 PM

## 2016-04-17 NOTE — Discharge Instructions (Signed)
Inpatient Rehab Discharge Instructions  Andrew AbbottJohn Cuevas Discharge date and time:  04/18/16  Activities/Precautions/ Functional Status: Activity: no lifting, driving, or strenuous exercise till cleared by MD Diet: cardiac diet Wound Care: none needed    Functional status:  ___ No restrictions     ___ Walk up steps independently __X_ 24/7 supervision/assistance   ___ Walk up steps with assistance ___ Intermittent supervision/assistance  ___ Bathe/dress independently ___ Walk with walker     _X__ Bathe/dress with assistance ___ Walk Independently    ___ Shower independently ___ Walk with assistance    ___ Shower with assistance _X__ No alcohol     ___ Return to work/school ________   Special Instructions:   COMMUNITY REFERRALS UPON DISCHARGE:    Home Health:   PT,OT, SP    Agency:[facility]    Phone:[phone]   Date of last service:04/18/2016   Medical Equipment/Items Ordered:SISTER TO GET TUB BENCH ON OWN   Other:DECLINES SUBSTANCE ABUSE RESOURCES  GENERAL COMMUNITY RESOURCES FOR PATIENT/FAMILY: Support Groups:[facility] SUPPORT GROUP EVERY SECOND Thursday @ 3:00-4:00 PM ON THE REHAB UNIT QUESTIONS CONTACT CAITLYN [phone]  STROKE/TIA DISCHARGE INSTRUCTIONS SMOKING Cigarette smoking nearly doubles your risk of having a stroke & is the single most alterable risk factor  If you smoke or have smoked in the last 12 months, you are advised to quit smoking for your health.  Most of the excess cardiovascular risk related to smoking disappears within a year of stopping.  Ask you doctor about anti-smoking medications  [place] Quit Line: 1-800-QUIT NOW  Free Smoking Cessation Classes [phone]  CHOLESTEROL Know your levels; limit fat & cholesterol in your diet  Lipid Panel     Component Value Date/Time   CHOL 142 03/25/2016 1141   TRIG 118.0 03/25/2016 1141   HDL 48.80 03/25/2016 1141   CHOLHDL 3 03/25/2016 1141   VLDL 23.6 03/25/2016 1141   LDLCALC 69 03/25/2016 1141        Many patients benefit from treatment even if their cholesterol is at goal.  Goal: Total Cholesterol (CHOL) less than 160  Goal:  Triglycerides (TRIG) less than 150  Goal:  HDL greater than 40  Goal:  LDL (LDLCALC) less than 100   BLOOD PRESSURE American Stroke Association blood pressure target is less that 120/80 mm/Hg  Your discharge blood pressure is:  BP: (!) 166/87  Monitor your blood pressure  Limit your salt and alcohol intake  Many individuals will require more than one medication for high blood pressure  DIABETES (A1c is a blood sugar average for last 3 months) Goal HGBA1c is under 7% (HBGA1c is blood sugar average for last 3 months)  Diabetes: prediabetic    Lab Results  Component Value Date   HGBA1C 5.7 (H) 04/03/2016     Your HGBA1c can be lowered with medications, healthy diet, and exercise.  Check your blood sugar as directed by your physician  Call your physician if you experience unexplained or low blood sugars.  PHYSICAL ACTIVITY/REHABILITATION Goal is 30 minutes at least 4 days per week  Activity: No driving, Therapies: See above Return to work: N/A  Activity decreases your risk of heart attack and stroke and makes your heart stronger.  It helps control your weight and blood pressure; helps you relax and can improve your mood.  Participate in a regular exercise program.  Talk with your doctor about the best form of exercise for you (dancing, walking, swimming, cycling).  DIET/WEIGHT Goal is to maintain a healthy weight  Your discharge diet is: Diet Heart Room service appropriate? Yes; Fluid consistency: Thin  liquids Your height is:  Height: 5\' 10"  (177.8 cm) Your current weight is: Weight: 100.6 kg (221 lb 12.8 oz) Your Body Mass Index (BMI) is:  BMI (Calculated): 31.9  Following the type of diet specifically designed for you will help prevent another stroke.  Your goal weight is:  174 lbs  Your goal Body Mass Index (BMI) is  19-24.  Healthy food habits can help reduce 3 risk factors for stroke:  High cholesterol, hypertension, and excess weight.  RESOURCES Stroke/Support Group:  Call [phone]   STROKE EDUCATION PROVIDED/REVIEWED AND GIVEN TO PATIENT Stroke warning signs and symptoms How to activate emergency medical system (call 911). Medications prescribed at discharge. Need for follow-up after discharge. Personal risk factors for stroke. Pneumonia vaccine given:  Flu vaccine given:  My questions have been answered, the writing is legible, and I understand these instructions.  I will adhere to these goals & educational materials that have been provided to me after my discharge from the hospital.     My questions have been answered and I understand these instructions. I will adhere to these goals and the provided educational materials after my discharge from the hospital.  Patient/Caregiver Signature _______________________________ Date __________  Clinician Signature _______________________________________ Date __________  Please bring this form and your medication list with you to all your follow-up doctor's appointments.

## 2016-04-17 NOTE — Progress Notes (Signed)
Physical Therapy Discharge Summary  Patient Details  Name: Andrew Cuevas MRN: [id number] Date of Birth: 1946/02/11  Today's Date: 04/17/2016 PT Individual Time: 9702-6378 PT Individual Time Calculation (min): 59 min    Patient has met 6 of 8 long term goals due to improved activity tolerance, improved balance, improved postural control, increased strength, ability to compensate for deficits, functional use of  right upper extremity and right lower extremity, improved attention, improved awareness and improved coordination.  Patient to discharge at an ambulatory level Supervision.   Patient's care partner is independent to provide the necessary cognitive assistance at discharge.  Reasons goals not met: Pt continues to require occasional verbal and tactile cues for squaring up to surface before sitting down.   Recommendation:  Patient will benefit from ongoing skilled PT services in home health setting to continue to advance safe functional mobility, address ongoing impairments in balance, attention, awareness, and safety, and minimize fall risk.  Equipment: No equipment provided  Reasons for discharge: treatment goals met  Patient/family agrees with progress made and goals achieved: Yes   Skilled Therapeutic Intervention: No c/o pain.  Session focus on reassessment of strength, coordination, balance, and functional mobility.  Pt currently transfers with overall supervision, occasional mod I.  Gait without device with supervision to attend to L side.  PT administered Berg Balance Scale and patient demonstrates significant fall risk as noted by score of  44/56 on Berg Balance Scale.  Pt discussed falls risk with pt and pt verbalized understanding, education also provided to pt's brother at end of session.  Pt returned to room at end of session with call bel in reach and needs met.   PT Discharge Precautions/Restrictions Precautions Precautions: Fall Restrictions Weight Bearing Restrictions:  No Pain Pain Assessment Pain Assessment: No/denies pain Vision/Perception  Perception Comments: body part ID: taps R hip and R knee when asked to touch R shoulder, touches R knee when asked to touch L shoulder, touches R eat when asked to touch L ear  Cognition Overall Cognitive Status: Impaired/Different from baseline Arousal/Alertness: Awake/alert Orientation Level: Oriented to person;Oriented to place;Oriented to situation;Disoriented to time (November 15, 2016) Attention: Selective;Sustained Sustained Attention: Appears intact Selective Attention: Impaired Awareness: Impaired Awareness Impairment:  (all impaired) Problem Solving: Impaired Safety/Judgment: Impaired Sensation Sensation Light Touch: Appears Intact Coordination Gross Motor Movements are Fluid and Coordinated: No Fine Motor Movements are Fluid and Coordinated: No Motor  Motor Motor: Abnormal postural alignment and control;Other (comment) Motor - Discharge Observations: ?new L lateral trunk lean  Mobility Bed Mobility Bed Mobility: Supine to Sit;Sit to Supine Supine to Sit: 6: Modified independent (Device/Increase time) Sit to Supine: 6: Modified independent (Device/Increase time) Transfers Transfers: Yes Sit to Stand: 6: Modified independent (Device/Increase time) Stand to Sit: 6: Modified independent (Device/Increase time) Stand Pivot Transfers: 6: Modified independent (Device/Increase time) Locomotion  Ambulation Ambulation: Yes Ambulation/Gait Assistance: 5: Supervision Ambulation Distance (Feet): 203 Feet Assistive device: None Gait Gait: Yes Gait Pattern: Impaired Gait Pattern: Step-to pattern;Decreased step length - left;Decreased step length - right;Shuffle;Festinating Gait velocity: slow Stairs / Scientist, research (life sciences) Assistance: 5: Supervision Stairs Assistance Details: Verbal cues for precautions/safety Stairs Assistance Details (indicate cue type and reason): requires cues to  place entire foot on step Stair Management Technique: Two rails Number of Stairs: 12 Wheelchair Mobility Wheelchair Mobility: No  Trunk/Postural Assessment  Cervical Assessment Cervical Assessment: Within Functional Limits Thoracic Assessment Thoracic Assessment: Within Functional Limits Lumbar Assessment Lumbar Assessment: Within Functional Limits Postural Control Postural Control: Within Functional Limits  Balance Balance Balance Assessed: Yes Standardized Balance Assessment Standardized Balance Assessment: Berg Balance Test Berg Balance Test Sit to Stand: Able to stand without using hands and stabilize independently Standing Unsupported: Able to stand safely 2 minutes Sitting with Back Unsupported but Feet Supported on Floor or Stool: Able to sit safely and securely 2 minutes Stand to Sit: Controls descent by using hands Transfers: Able to transfer safely, definite need of hands Standing Unsupported with Eyes Closed: Able to stand 10 seconds safely Standing Ubsupported with Feet Together: Able to place feet together independently and stand for 1 minute with supervision From Standing, Reach Forward with Outstretched Arm: Can reach confidently >25 cm (10") From Standing Position, Pick up Object from Floor: Able to pick up shoe safely and easily From Standing Position, Turn to Look Behind Over each Shoulder: Looks behind from both sides and weight shifts well Turn 360 Degrees: Able to turn 360 degrees safely but slowly Standing Unsupported, Alternately Place Feet on Step/Stool: Able to complete 4 steps without aid or supervision Standing Unsupported, One Foot in Front: Able to take small step independently and hold 30 seconds Standing on One Leg: Tries to lift leg/unable to hold 3 seconds but remains standing independently Total Score: 44 Extremity Assessment      RLE Assessment RLE Assessment: Within Functional Limits LLE Assessment LLE Assessment: Within Functional  Limits   See Function Navigator for Current Functional Status.  Edi Gorniak E Penven-Crew 04/17/2016, 2:32 PM

## 2016-04-17 NOTE — Progress Notes (Signed)
Social Work Patient ID: Andrew AbbottJohn Cuevas, male   DOB: Jun 29, 1945, 71 y.o.   MRN: [id number] Sister decided she can get a tub bench on her own and for this worker not to order one for pt.

## 2016-04-17 NOTE — Discharge Summary (Signed)
Physician Discharge Summary  Patient ID: Andrew Cuevas MRN: [id number] DOB/AGE: August 08, 1945 71 y.o.  Admit date: 04/08/2016 Discharge date: 04/18/2016  Discharge Diagnoses:  Principal Problem:   Nontraumatic acute hemorrhage of basal ganglia (HCC) Active Problems:   Right hemiparesis (HCC)   Cough   Abnormal chest x-ray   Stage 3 chronic kidney disease   Infiltrate noted on imaging study   Reactive depression   Discharged Condition: stable.   Significant Diagnostic Studies: Ct Angio Head W Or Wo Contrast  Result Date: 04/03/2016 CLINICAL DATA:  Left basal ganglia hemorrhage. EXAM: CT ANGIOGRAPHY HEAD AND NECK TECHNIQUE: Multidetector CT imaging of the head and neck was performed using the standard protocol during bolus administration of intravenous contrast. Multiplanar CT image reconstructions and MIPs were obtained to evaluate the vascular anatomy. Carotid stenosis measurements (when applicable) are obtained utilizing NASCET criteria, using the distal internal carotid diameter as the denominator. CONTRAST:  50 cc Isovue 370 COMPARISON:  Head CT 04/02/2016 FINDINGS: CTA NECK FINDINGS Aortic arch: Atherosclerosis of the aortic arch but no aneurysm or dissection. Branching pattern of the brachiocephalic vessels from the arch is normal. No origin stenoses. Right carotid system: Right common carotid artery is widely patent to the bifurcation. Carotid bifurcation is widely patent, free of visible atherosclerotic plaque. Cervical internal carotid artery is tortuous but widely patent. Left carotid system: Common carotid artery widely patent to the bifurcation. Carotid bifurcation widely patent without atherosclerotic plaque. Cervical ICA is tortuous but widely patent. Vertebral arteries: Both vertebral artery origins are widely patent. Both vertebral arteries are widely patent through the cervical region, left larger than right. Skeleton: Ordinary spondylosis. Other neck: No significant soft tissue  finding. Upper chest: Mild emphysema and interstitial scarring. Review of the MIP images confirms the above findings CTA HEAD FINDINGS Anterior circulation: Both internal carotid arteries are patent through the skullbase and siphon regions. Anterior and middle cerebral vessels are patent without proximal stenosis, aneurysm or vascular malformation. Posterior circulation: Both vertebral arteries patent to the basilar. No basilar stenosis. Posterior circulation branch vessels are normal. Patent posterior communicating arteries bilaterally. Venous sinuses: Patent and normal Anatomic variants: None significant Delayed phase: Left basal ganglia intraparenchymal hematoma no larger than seen yesterday. Review of the MIP images confirms the above findings IMPRESSION: No change in size of left basal ganglia intraparenchymal hematoma. The vessels are tortuous as might be seen with hypertension, but there is no evidence of atherosclerotic plaque, stenosis, dissection, aneurysm or vascular malformation. Electronically Signed   By: Paulina Fusi M.D.   On: 04/03/2016 13:03   Dg Chest 2 View  Result Date: 04/14/2016 CLINICAL DATA:  Follow-up right basilar opacity. EXAM: CHEST  2 VIEW COMPARISON:  Chest x-ray of April 10, 2016 FINDINGS: The lungs are mildly hyperinflated. There is persistent increased density in the right infrahilar region which appears to project in the right lower lobe. The left lung is clear. The heart is top-normal in size. The pulmonary vascularity is normal. There is tortuosity of the ascending and descending thoracic aorta. There is no pleural effusion. IMPRESSION: Persistent increased density in the right infrahilar region likely in the lower lobe consistent with atelectasis or pneumonia. Mild interstitial prominence throughout both lungs is stable and likely reflects the patient's smoking history and or COPD. Electronically Signed   By: David  [place] M.D.   On: 04/14/2016 07:15   Dg Chest 2  View  Result Date: 04/10/2016 CLINICAL DATA:  Shortness of breath for 1 week. EXAM: CHEST  2 VIEW COMPARISON:  April 05, 2016 FINDINGS: The opacity in the right base remains but is less well defined. Minimal opacity in the left base may be atelectasis. No change in cardiomegaly. The thoracic aorta is tortuous. No pneumothorax. No other acute abnormalities. IMPRESSION: 1. Improving right basilar opacity. Recommend follow-up to complete resolution. 2. Minimal opacity in the left base may be atelectasis. Recommend attention on follow-up. Electronically Signed   By: Gerome Samavid  Williams III M.D   On: 04/10/2016 11:32       Labs:  Basic Metabolic Panel: BMP Latest Ref Rng & Units 04/15/2016 04/13/2016 04/09/2016  Glucose 65 - 99 mg/dL 92 95 92  BUN 6 - 20 mg/dL 14 15 13   Creatinine 0.61 - 1.24 mg/dL 1.61(W1.33(H) 9.60(A1.35(H) 5.40(J1.29(H)  Sodium 135 - 145 mmol/L 136 135 136  Potassium 3.5 - 5.1 mmol/L 4.2 4.2 4.1  Chloride 101 - 111 mmol/L 105 105 104  CO2 22 - 32 mmol/L 22 20(L) 22  Calcium 8.9 - 10.3 mg/dL 9.4 9.3 9.1    CBC: CBC Latest Ref Rng & Units 04/15/2016 04/13/2016 04/09/2016  WBC 4.0 - 10.5 K/uL 3.1(L) 4.1 3.9(L)  Hemoglobin 13.0 - 17.0 g/dL 11.3(L) 11.6(L) 10.5(L)  Hematocrit 39.0 - 52.0 % 35.3(L) 36.6(L) 33.4(L)  Platelets 150 - 400 K/uL 259 261 232    CBG: No results for input(s): GLUCAP in the last 168 hours.   Brief HPI:   Andrew RuizJohn Cuevas a 70 y.o.malewith history of HTN, chronic HA, prior CVA without significant residual deficits who presented to ED on 04/02/16 with one month history of testicular pain and was found to have right sided weakness, soft voice and mild facial weakness. History taken from chart review and patient. BP elevated at admission 245/133 and he was started on cardene drip. Left testicular pain felt to be due to very large left sided hydrocele--no intervention needed. CT head reviewed, showing acute left basal ganglial hemorrhage with mild edema and atrophy. CTA head and neck  without stenosis, dissection, plaque or AVM. 2D echo with EF 65-70% with mild concentric LVH and grade 2 diastolic dysfunction. UDS positive for cocaine. BLE dopplers negative for DVT. Dr. Roda ShuttersXu felt that bleed due to HTN emergency and cocaine abuse. Therapy ongoing and patient noted to have poor safety with ADL tasks, problems sequencing, balance deficits with festinating gait. CIR recommended by MD and rehab team.    Hospital Course: Carlis AbbottJohn Rastetter was admitted to rehab 04/08/2016 for inpatient therapies to consist of PT, ST and OT at least three hours five days a week. Past admission physiatrist, therapy team and rehab RN have worked together to provide customized collaborative inpatient rehab. Blood pressures continued to be labile and medications were titrated upwards. His cough has improved with CXR showing improvement. He was started on iron supplement for anemia and follow up CBC is stable. He was started on Prozac  for mild to moderate depression. His po intake has been good and he is continent of bowel and bladder. He has been educated on cocaine and tobacco cessation.  He continues to have poor awareness of deficits and requires cues for safety. He has progressed to supervision level and will continue to receive follow up HHPT, HHOT and HHST by [facility] after discharge.    Rehab course: During patient's stay in rehab weekly team conferences were held to monitor patient's progress, set goals and discuss barriers to discharge. At admission, patient required min assist with basic self-care tasks and mobility. He demonstrates  Moderate cognitive deficits, mild  impairments in receptive language and expressive language characterized by decreased word finding deficits. He has had improvement in activity tolerance, balance, postural control, as well as ability to compensate for deficits. He is able to complete ADL tasks with supervision.  He is able to express basic needs at modified independent level  and is able to follow complex tasks with supervision. He requires min to moderate cues to complete functional and familiar task due to decreased attention and difficulty with functional problem solving. He is able to perform transfers at modified independent level and is ambulating 200' with supervision--no AD needed. Family education was completed with sister regarding all aspects of care and need for supervision due to safety.     Disposition: 01-Home or Self Care  Diet: Heart Healthy.   Special Instructions: 1. Needs referral to GU for evaluation of hydrocele.  2. Needs 24 hours supervision.   Discharge Instructions    Ambulatory referral to Physical Medicine Rehab    Complete by:  As directed    1-2 weeks transitional care     Allergies as of 04/18/2016   No Known Allergies     Medication List    TAKE these medications   acetaminophen 325 MG tablet Commonly known as:  TYLENOL Take 650 mg by mouth every 6 (six) hours as needed for mild pain.   amLODipine 5 MG tablet Commonly known as:  NORVASC Take 1 tablet (5 mg total) by mouth daily.   cloNIDine 0.1 MG tablet Commonly known as:  CATAPRES Take 1 tablet (0.1 mg total) by mouth 2 (two) times daily.   FLUoxetine 10 MG capsule Commonly known as:  PROZAC Take 1 capsule (10 mg total) by mouth daily.   lisinopril 30 MG tablet Commonly known as:  PRINIVIL,ZESTRIL Take 1 tablet (30 mg total) by mouth daily. What changed:  medication strength  how much to take   pantoprazole 40 MG tablet Commonly known as:  PROTONIX Take 1 tablet (40 mg total) by mouth daily.   senna-docusate 8.6-50 MG tablet Commonly known as:  Senokot-S Take 1 tablet by mouth 2 (two) times daily.      Follow-up Information    Erick Colace, MD Follow up.   Specialty:  Physical Medicine and Rehabilitation Why:  office will call you with follow up appointment Contact information: [address] [place]  [address] [phone]        Delia Heady, MD. Call in 1 day(s).   Specialties:  Neurology, Radiology Why:  for follow up appointment Contact information: [address] [place] [address] [phone]        Jilda Roche Ashley, DO Follow up on 04/27/2016.   Specialty:  Family Medicine Why:  Appointment @ 11:15 AM Contact information: [address] [place] [address] [phone]           Signed: Jacquelynn Cree 04/20/2016, 7:21 PM

## 2016-04-17 NOTE — Plan of Care (Signed)
Problem: RH Bed to Chair Transfers Goal: LTG Patient will perform bed/chair transfers w/assist (PT) LTG: Patient will perform bed/chair transfers with assistance, with/without cues (PT).  Outcome: Adequate for Discharge Requires occasional cueing for squaring up to chair before sitting

## 2016-04-17 NOTE — Progress Notes (Signed)
Occupational Therapy Discharge Summary  Patient Details  Name: Andrew Cuevas MRN: [id number] Date of Birth: February 15, 1946  Patient has met 10 of 11 long term goals due to improved activity tolerance, improved balance and postural control.  Patient to discharge at overall Supervision level.  Patient's care partner is independent to provide the necessary physical and cognitive assistance at discharge.  Patient's sister attended therapy with pt to observe functional mobility and bathroom transfers.  She demonstrated ability to provide necessary cues for safety and attention to Rt during functional mobility.  Reasons goals not met: Pt continues to require cues and assist for emergent awareness.  Recommendation:  Patient will benefit from ongoing skilled OT services in home health setting to continue to advance functional skills in the area of BADL and Reduce care partner burden.  Equipment: tub bench  Reasons for discharge: treatment goals met and discharge from hospital  Patient/family agrees with progress made and goals achieved: Yes  OT Discharge Precautions/Restrictions  Precautions Precautions: Fall Precaution Comments: Rt inattention Restrictions Weight Bearing Restrictions: No General   Vital Signs Therapy Vitals Temp: 98.6 F (37 C) Temp Source: Oral Pulse Rate: 78 Resp: 16 BP: (!) 148/84 Patient Position (if appropriate): Lying Oxygen Therapy SpO2: 98 % O2 Device: Not Delivered Pain Pain Assessment Pain Assessment: No/denies pain ADL  See Function Navigator Vision/Perception  Vision- History Baseline Vision/History: Wears glasses Wears Glasses: Reading only Patient Visual Report: Blurring of vision Vision- Assessment Vision Assessment?: No apparent visual deficits Eye Alignment: Within Functional Limits Ocular Range of Motion: Within Functional Limits Alignment/Gaze Preference: Within Defined Limits Tracking/Visual Pursuits: Able to track stimulus in all quads  without difficulty Saccades: Within functional limits Visual Fields: No apparent deficits Depth Perception:  (WFL) Perception Comments: body part ID: taps R hip and R knee when asked to touch R shoulder, touches R knee when asked to touch L shoulder, touches R eat when asked to touch L ear  Cognition Overall Cognitive Status: Impaired/Different from baseline Arousal/Alertness: Awake/alert Orientation Level: Oriented X4 Attention: Selective;Sustained Sustained Attention: Appears intact Selective Attention: Impaired Selective Attention Impairment: Verbal basic;Functional basic Memory: Impaired Memory Impairment: Decreased short term memory;Decreased recall of new information Awareness: Impaired Awareness Impairment:  (all impaired) Problem Solving: Impaired Problem Solving Impairment: Functional basic Safety/Judgment: Impaired Sensation Sensation Light Touch: Appears Intact Hot/Cold: Appears Intact Proprioception: Appears Intact Coordination Gross Motor Movements are Fluid and Coordinated: No Fine Motor Movements are Fluid and Coordinated: No 9 Hole Peg Test: Rt: 58 seconds, Lt: 50 seconds on eval.  Rt: 1:17, Lt: 37 seconds on 2/16 Motor  Motor Motor: Abnormal postural alignment and control;Other (comment) Motor - Discharge Observations: ?new L lateral trunk lean Mobility  Bed Mobility Bed Mobility: Supine to Sit;Sit to Supine Supine to Sit: 6: Modified independent (Device/Increase time) Sit to Supine: 6: Modified independent (Device/Increase time) Transfers Sit to Stand: 6: Modified independent (Device/Increase time) Stand to Sit: 6: Modified independent (Device/Increase time)  Trunk/Postural Assessment  Cervical Assessment Cervical Assessment: Within Functional Limits Thoracic Assessment Thoracic Assessment: Within Functional Limits Lumbar Assessment Lumbar Assessment: Within Functional Limits Postural Control Postural Control: Within Functional Limits   Balance Balance Balance Assessed: Yes Standardized Balance Assessment Standardized Balance Assessment: Berg Balance Test Berg Balance Test Sit to Stand: Able to stand without using hands and stabilize independently Standing Unsupported: Able to stand safely 2 minutes Sitting with Back Unsupported but Feet Supported on Floor or Stool: Able to sit safely and securely 2 minutes Stand to Sit: Controls descent by using hands  Transfers: Able to transfer safely, definite need of hands Standing Unsupported with Eyes Closed: Able to stand 10 seconds safely Standing Ubsupported with Feet Together: Able to place feet together independently and stand for 1 minute with supervision From Standing, Reach Forward with Outstretched Arm: Can reach confidently >25 cm (10") From Standing Position, Pick up Object from Floor: Able to pick up shoe safely and easily From Standing Position, Turn to Look Behind Over each Shoulder: Looks behind from both sides and weight shifts well Turn 360 Degrees: Able to turn 360 degrees safely but slowly Standing Unsupported, Alternately Place Feet on Step/Stool: Able to complete 4 steps without aid or supervision Standing Unsupported, One Foot in Front: Able to take small step independently and hold 30 seconds Standing on One Leg: Tries to lift leg/unable to hold 3 seconds but remains standing independently Total Score: 44 Extremity/Trunk Assessment RUE Assessment RUE Assessment: Exceptions to [facility] RUE AROM (degrees) Overall AROM Right Upper Extremity: Deficits RUE Overall AROM Comments: shoulder flexion limited to 120 degrees, however able to use functionally with all ADLs RUE Strength RUE Overall Strength: Deficits RUE Overall Strength Comments: grossly 4/5, loose gross grasp LUE Assessment LUE Assessment: Within Functional Limits (strength grossly 4+/5, loose gross grasp)   See Function Navigator for Current Functional Status.  Simonne Come 04/17/2016, 3:46 PM

## 2016-04-17 NOTE — Progress Notes (Signed)
Subjective/Complaints: Pt laying in bed this AM.  He slept well overnight and is looking forward to d/c tomorrow.   Review of systems: Denies chest pain, shortness of breath, nausea, vomiting, bowel or bladder issues.  Objective: Vital Signs: Blood pressure 133/78, pulse 65, temperature 98.1 F (36.7 C), temperature source Oral, resp. rate 18, height 5' 10" (1.778 m), weight 94.5 kg (208 lb 6.4 oz), SpO2 98 %. No results found. Results for orders placed or performed during the hospital encounter of 04/08/16 (from the past 72 hour(s))  Basic metabolic panel     Status: Abnormal   Collection Time: 04/15/16  5:55 AM  Result Value Ref Range   Sodium 136 135 - 145 mmol/L   Potassium 4.2 3.5 - 5.1 mmol/L   Chloride 105 101 - 111 mmol/L   CO2 22 22 - 32 mmol/L   Glucose, Bld 92 65 - 99 mg/dL   BUN 14 6 - 20 mg/dL   Creatinine, Ser 1.33 (H) 0.61 - 1.24 mg/dL   Calcium 9.4 8.9 - 10.3 mg/dL   GFR calc non Af Amer 52 (L) >60 mL/min   GFR calc Af Amer >60 >60 mL/min    Comment: (NOTE) The eGFR has been calculated using the CKD EPI equation. This calculation has not been validated in all clinical situations. eGFR's persistently <60 mL/min signify possible Chronic Kidney Disease.    Anion gap 9 5 - 15  CBC     Status: Abnormal   Collection Time: 04/15/16  5:55 AM  Result Value Ref Range   WBC 3.1 (L) 4.0 - 10.5 K/uL   RBC 4.42 4.22 - 5.81 MIL/uL   Hemoglobin 11.3 (L) 13.0 - 17.0 g/dL   HCT 35.3 (L) 39.0 - 52.0 %   MCV 79.9 78.0 - 100.0 fL   MCH 25.6 (L) 26.0 - 34.0 pg   MCHC 32.0 30.0 - 36.0 g/dL   RDW 16.2 (H) 11.5 - 15.5 %   Platelets 259 150 - 400 K/uL      General: NAD. Vital signs reviewed. Psych: Mood and affect are appropriate Heart: RRR. No JVD. Lungs: Clear to auscultation, breathing unlabored Abdomen: Positive bowel sounds, soft  Skin: No evidence of breakdown, no evidence of rash Neurologic: Alert Motor 5/5 in left deltoid, bicep, tricep, grip, hip flexor, knee  extensors, ankle dorsiflexor and plantar flexor 4+/5 right side with dysmetria (unchanged) Musculoskeletal: No edema. No tenderness.   Assessment/Plan: 1. Functional deficits secondary to acute Left Basal ganglia ICH which require 3+ hours per day of interdisciplinary therapy in a comprehensive inpatient rehab setting. Physiatrist is providing close team supervision and 24 hour management of active medical problems listed below. Physiatrist and rehab team continue to assess barriers to discharge/monitor patient progress toward functional and medical goals. FIM: Function - Bathing Position: Shower Body parts bathed by patient: Right arm, Left arm, Chest, Abdomen, Front perineal area, Buttocks, Right upper leg, Left upper leg, Right lower leg, Left lower leg, Back Body parts bathed by helper: Back Assist Level: Set up Set up : To obtain items  Function- Upper Body Dressing/Undressing What is the patient wearing?: Pull over shirt/dress Pull over shirt/dress - Perfomed by patient: Thread/unthread right sleeve, Thread/unthread left sleeve, Put head through opening, Pull shirt over trunk Assist Level: More than reasonable time Function - Lower Body Dressing/Undressing What is the patient wearing?: Underwear, Pants, Shoes Position: Sitting EOB Underwear - Performed by patient: Thread/unthread right underwear leg, Thread/unthread left underwear leg, Pull underwear up/down Pants- Performed by  patient: Thread/unthread right pants leg, Thread/unthread left pants leg, Pull pants up/down Non-skid slipper socks- Performed by patient: Don/doff right sock, Don/doff left sock Socks - Performed by patient: Don/doff right sock, Don/doff left sock Socks - Performed by helper: Don/doff right sock Shoes - Performed by patient: Don/doff right shoe, Don/doff left shoe, Fasten right, Fasten left Shoes - Performed by helper: Don/doff right shoe Assist for footwear: Setup Assist for lower body dressing: Set  up Set up : To obtain clothing/put away  Function - Toileting Toileting steps completed by patient: Adjust clothing prior to toileting, Performs perineal hygiene, Adjust clothing after toileting Toileting steps completed by helper: Adjust clothing prior to toileting [place]: Grab bar or rail Assist level: More than reasonable time  Function - Toilet Transfers Toilet transfer assistive device: Grab bar Assist level to toilet: Supervision or verbal cues Assist level from toilet: Supervision or verbal cues  Function - Chair/bed transfer Chair/bed transfer method: Ambulatory Chair/bed transfer assist level: Supervision or verbal cues Chair/bed transfer assistive device: Armrests, Walker Chair/bed transfer details: Verbal cues for technique, Verbal cues for precautions/safety, Tactile cues for sequencing, Tactile cues for weight shifting, Tactile cues for posture  Function - Locomotion: Wheelchair Will patient use wheelchair at discharge?: No Wheelchair activity did not occur: N/A Function - Locomotion: Ambulation Assistive device: Walker-rolling Max distance: 175 Assist level: Supervision or verbal cues Assist level: Supervision or verbal cues Assist level: Supervision or verbal cues Assist level: Supervision or verbal cues Assist level: Touching or steadying assistance (Pt > 75%)  Function - Comprehension Comprehension: Auditory Comprehension assist level: Understands basic 75 - 89% of the time/ requires cueing 10 - 24% of the time  Function - Expression Expression: Verbal Expression assist level: Expresses basic 75 - 89% of the time/requires cueing 10 - 24% of the time. Needs helper to occlude trach/needs to repeat words.  Function - Social Interaction Social Interaction assist level: Interacts appropriately 90% of the time - Needs monitoring or encouragement for participation or interaction.  Function - Problem Solving Problem solving assist level:  Solves basic 75 - 89% of the time/requires cueing 10 - 24% of the time  Function - Memory Memory assist level: Recognizes or recalls 75 - 89% of the time/requires cueing 10 - 24% of the time Patient normally able to recall (first 3 days only): Current season, Location of own room, Staff names and faces, That he or she is in a hospital  Medical Problem List and Plan: 1.  Poor safety awareness, limitations with self-care, balance deficits secondary to left basal ganglial hemorrhage.  Cont CIR, plan for d/c tomorrow  Pt to follow up with Dr. Letta Pate for transitional care management in 1-2 weeks. 2.  DVT Prophylaxis/Anticoagulation: Pharmaceutical: Lovenox, without evidence of bleeding  3. Pain Management: N/A 4. Mood: LCSW to follow for evaluation and support  Prozaac started 2/13 5. Neuropsych: This patient is not fully capable of making decisions on his own behalf.  Spoke with Neuropsych, appreciate recs- Mild/Mod depression.   6. Skin/Wound Care: Maintain adequate nutritional and hydration status. Routine pressure relief measures.   7. Fluids/Electrolytes/Nutrition: Monitor I/O.  8. HTN: Monitor BP bid-continue catapres  Lisinopril increased to 30 on 2/10.   Norvasc 5 started 2/12  Slightly labile, but overall improved 2/16 9. Tobacco/ cocaine abuse: continue to educate on importance of cessation.    CXR 2/9 reviewed showing improvement in right infiltrate and left ?atelectasis.  Repeat CXR 2/13 reviewed, stable from previous, likely atelectasis, no signs/symptoms  of infection at present.  10. Large left sided hydrocele: follow up with urology after discharge.  11. B 12 deficiency with anemia:Question anemia of chronic disease-- B 12 level- 136. Supplemented. Added iron and monitor.  12. CKD II: Baseline SCr -1.3. Continue to monitor with serial checks.   2/14 lab at baseline 13. ABLA  Hb 11.3 on 2/14   LOS (Days) 9 A FACE TO FACE EVALUATION WAS PERFORMED  Ankit Lorie Phenix 04/17/2016, 8:34 AM

## 2016-04-17 NOTE — Progress Notes (Signed)
Occupational Therapy Session Note  Patient Details  Name: Carlis AbbottJohn Mcclure MRN: [id number] Date of Birth: 1945-06-23  Today's Date: 04/17/2016 OT Individual Time: [id number] and 1430-1500 OT Individual Time Calculation (min): 57 min and 30 min   Short Term Goals: Week 1:  OT Short Term Goal 1 (Week 1): Pt will complete toilet transfers with LRAD with supervision OT Short Term Goal 2 (Week 1): Pt will complete bathing at sit > stand level with supervision OT Short Term Goal 3 (Week 1): Pt will complete LB dressing with min assist at sit > stand level OT Short Term Goal 4 (Week 1): Pt will complete 3 grooming tasks in standing with supervision  Skilled Therapeutic Interventions/Progress Updates:    1) Completed ADL retraining at overall supervision/setup in room shower.  Pt completed dressing tasks with setup. Ambulated to therapy gym with supervision and improved awareness of Rt environment.  Engaged in 3D pipe tree puzzle activity in standing with focus on standing tolerance and visual scanning to Rt.  Pt demonstrated difficulty with sequencing of puzzle requiring max cues initially faded to supervision then requiring mod assist to complete.  Placed picture model on Rt to promote visual scanning to Rt, cues for pt to bring picture model to midline when demonstrating increased difficulty.  Completed 9 hole peg test with Rt: 1:17 and Lt: 47 seconds, overall decreased speed and mild difficulty picking up small pegs.  2) Treatment session with focus on functional mobility and path finding.  Engaged pt in scavenger hunt on unit with focus on locating familiar places and items that would be found in such places.  Pt able to locate all items without cues, requiring increased time due to slow pace of gait and decreased attention to Rt however able to compensate without any cues this session.  Therapy Documentation Precautions:  Precautions Precautions: Fall Restrictions Weight Bearing Restrictions:  No Pain: Pain Assessment Pain Assessment: No/denies pain Pain Score: 0-No pain  See Function Navigator for Current Functional Status.   Therapy/Group: Individual Therapy  Rosalio LoudHOXIE, Jaskirat Zertuche 04/17/2016, 10:27 AM

## 2016-04-18 NOTE — Progress Notes (Signed)
Subjective/Complaints: Had a good night. Excited to go home today  ROS: pt denies nausea, vomiting, diarrhea, cough, shortness of breath or chest pain   Objective: Vital Signs: Blood pressure (!) 166/99, pulse 64, temperature 98.4 F (36.9 C), temperature source Oral, resp. rate 18, height 5\' 10"  (1.778 m), weight 94.5 kg (208 lb 6.4 oz), SpO2 98 %. No results found. No results found for this or any previous visit (from the past 72 hour(s)).    General: NAD. Vital signs reviewed. Psych: Mood and affect are appropriate Heart: RRR. Lungs: Clear to auscultation, breathing unlabored Abdomen: Positive bowel sounds, soft  Skin: No evidence of breakdown, no evidence of rash Neurologic: Alert Motor 5/5 in left deltoid, bicep, tricep, grip, hip flexor, knee extensors, ankle dorsiflexor and plantar flexor 4+/5 right side with dysmetria (unchanged) Musculoskeletal: No edema. No tenderness.   Assessment/Plan: 1. Functional deficits secondary to acute Left Basal ganglia ICH which require 3+ hours per day of interdisciplinary therapy in a comprehensive inpatient rehab setting. Physiatrist is providing close team supervision and 24 hour management of active medical problems listed below. Physiatrist and rehab team continue to assess barriers to discharge/monitor patient progress toward functional and medical goals. FIM: Function - Bathing Position: Shower Body parts bathed by patient: Right arm, Left arm, Chest, Abdomen, Front perineal area, Buttocks, Right upper leg, Left upper leg, Right lower leg, Left lower leg, Back Body parts bathed by helper: Back Assist Level: Set up Set up : To obtain items  Function- Upper Body Dressing/Undressing What is the patient wearing?: Pull over shirt/dress Pull over shirt/dress - Perfomed by patient: Thread/unthread right sleeve, Thread/unthread left sleeve, Put head through opening, Pull shirt over trunk Assist Level: More than reasonable time Function  - Lower Body Dressing/Undressing What is the patient wearing?: Underwear, Pants, Shoes Position: Sitting EOB Underwear - Performed by patient: Thread/unthread right underwear leg, Thread/unthread left underwear leg, Pull underwear up/down Pants- Performed by patient: Thread/unthread right pants leg, Thread/unthread left pants leg, Pull pants up/down Non-skid slipper socks- Performed by patient: Don/doff right sock, Don/doff left sock Socks - Performed by patient: Don/doff right sock, Don/doff left sock Socks - Performed by helper: Don/doff right sock Shoes - Performed by patient: Don/doff right shoe, Don/doff left shoe, Fasten right, Fasten left Shoes - Performed by helper: Don/doff right shoe Assist for footwear: Setup Assist for lower body dressing: Set up Set up : To obtain clothing/put away  Function - Toileting Toileting steps completed by patient: Adjust clothing prior to toileting, Performs perineal hygiene, Adjust clothing after toileting Toileting steps completed by helper: Adjust clothing prior to toileting Toileting Assistive Devices: Grab bar or rail Assist level: More than reasonable time  Function - Toilet Transfers Toilet transfer assistive device: Grab bar Assist level to toilet: Supervision or verbal cues Assist level from toilet: Supervision or verbal cues  Function - Chair/bed transfer Chair/bed transfer method: Stand pivot Chair/bed transfer assist level: Supervision or verbal cues Chair/bed transfer assistive device: Armrests, Walker Chair/bed transfer details: Verbal cues for technique, Verbal cues for precautions/safety, Tactile cues for sequencing, Tactile cues for weight shifting, Tactile cues for posture  Function - Locomotion: Wheelchair Will patient use wheelchair at discharge?: No Wheelchair activity did not occur: N/A Function - Locomotion: Ambulation Assistive device: No device Max distance: 300 Assist level: Supervision or verbal cues Assist  level: Supervision or verbal cues Assist level: Supervision or verbal cues Assist level: Supervision or verbal cues Assist level: Supervision or verbal cues  Function - Comprehension Comprehension:  Auditory Comprehension assist level: Understands basic 90% of the time/cues < 10% of the time  Function - Expression Expression: Verbal Expression assist level: Expresses basic 90% of the time/requires cueing < 10% of the time.  Function - Social Interaction Social Interaction assist level: Interacts appropriately 90% of the time - Needs monitoring or encouragement for participation or interaction.  Function - Problem Solving Problem solving assist level: Solves basic 90% of the time/requires cueing < 10% of the time  Function - Memory Memory assist level: Recognizes or recalls 75 - 89% of the time/requires cueing 10 - 24% of the time Patient normally able to recall (first 3 days only): Current season, Location of own room, Staff names and faces, That he or she is in a hospital  Medical Problem List and Plan: 1.  Poor safety awareness, limitations with self-care, balance deficits secondary to left basal ganglial hemorrhage.  Cont CIR, dc today  Pt to follow up with Dr. Wynn Banker for transitional care management in 1-2 weeks. 2.  DVT Prophylaxis/Anticoagulation: Pharmaceutical: Lovenox, without evidence of bleeding  3. Pain Management: N/A 4. Mood: LCSW to follow for evaluation and support  Prozaac started 2/13 5. Neuropsych: This patient is not fully capable of making decisions on his own behalf.    Neuropsych, appreciate recs- Mild/Mod depression.   6. Skin/Wound Care: Maintain adequate nutritional and hydration status. Routine pressure relief measures.   7. Fluids/Electrolytes/Nutrition: Monitor I/O.  8. HTN: Monitor BP bid-continue catapres  Lisinopril increased to 30 on 2/10.   Norvasc 5 started 2/12  Increased this morning but otherwise tightly controlled---follow up as  outpt/record numbers at home 9. Tobacco/ cocaine abuse: continue to educate on importance of cessation.    CXR 2/9 reviewed showing improvement in right infiltrate and left ?atelectasis.  Repeat CXR 2/13 reviewed, stable from previous, likely atelectasis, no signs/symptoms of infection at present.  10. Large left sided hydrocele: follow up with urology after discharge.  11. B 12 deficiency with anemia:Question anemia of chronic disease-- B 12 level- 136. Supplemented. Added iron and monitor.  12. CKD II: Baseline SCr -1.3. Continue to monitor with serial checks.   2/14 lab at baseline 13. ABLA  Hb 11.3 on 2/14   LOS (Days) 10 A FACE TO FACE EVALUATION WAS PERFORMED  SWARTZ,ZACHARY T 04/18/2016, 8:40 AM

## 2016-04-18 NOTE — Progress Notes (Signed)
Patient discharged to home with family about 1250. Discharge instructions went over with patient and patient's sister. All questions answered and education completed.

## 2016-04-20 ENCOUNTER — Telehealth: Payer: Self-pay

## 2016-04-20 NOTE — Telephone Encounter (Signed)
Transition Care Management Follow-up Telephone Call  ADMISSION DATE: DISCHARGE DATE:  DISCHARGE DATE: 04/18/16    How have you been since you were released from the hospital? Per patients caregiver,patient has been sleeping a lot. States patient will not say if he is in pain.      Do you understand why you were in the hospital? Caregiver states patient does understand why he was in hospital.  Do you understand the discharge instrcutions? Yes per caregiver.  Items Reviewed:  Medications reviewed:  Medications reviewed with caregiver.  Allergies reviewed: Yes  Dietary changes reviewed:Low Sodium  Low fat  Referrals reviewed: Appointment scheduled.   Functional Questionnaire:   Activities of Daily Living (ADLs):  No help needed at this time   Any transportation issues/concerns?: No   Any patient concerns? Per caregiver patient may still be grieving from loss of wife and relocating from state to state. Possible depression.   Confirmed importance and date/time of follow-up visits scheduled: Yes   Confirmed with patient if condition begins to worsen call PCP or go to the ER. Yes    Patient was given the Call-a-Nurse line [phone]: Yes

## 2016-04-21 ENCOUNTER — Other Ambulatory Visit: Payer: Self-pay | Admitting: Family Medicine

## 2016-04-21 DIAGNOSIS — I10 Essential (primary) hypertension: Secondary | ICD-10-CM

## 2016-04-21 NOTE — Telephone Encounter (Signed)
I have refilled Rx Lisinopril #90 tablets #0 refills. TL/CMA

## 2016-04-27 ENCOUNTER — Inpatient Hospital Stay: Payer: Medicare Other | Admitting: Family Medicine

## 2016-04-27 ENCOUNTER — Telehealth: Payer: Self-pay | Admitting: Family Medicine

## 2016-04-27 NOTE — Telephone Encounter (Signed)
Son called to [facility] patient hospital follow up for today due to miscommunication between the other children, son provides transportation for patient. [facility] Charge or no charge

## 2016-04-27 NOTE — Telephone Encounter (Signed)
No charge. 

## 2016-04-29 ENCOUNTER — Ambulatory Visit (INDEPENDENT_AMBULATORY_CARE_PROVIDER_SITE_OTHER): Payer: Medicare Other | Admitting: Family Medicine

## 2016-04-29 ENCOUNTER — Encounter: Payer: Self-pay | Admitting: Family Medicine

## 2016-04-29 VITALS — BP 152/100 | HR 70 | Temp 98.2°F | Resp 16 | Ht 70.0 in | Wt 210.2 lb

## 2016-04-29 DIAGNOSIS — I69398 Other sequelae of cerebral infarction: Secondary | ICD-10-CM

## 2016-04-29 DIAGNOSIS — I69390 Apraxia following cerebral infarction: Secondary | ICD-10-CM

## 2016-04-29 DIAGNOSIS — Z09 Encounter for follow-up examination after completed treatment for conditions other than malignant neoplasm: Secondary | ICD-10-CM

## 2016-04-29 DIAGNOSIS — N433 Hydrocele, unspecified: Secondary | ICD-10-CM

## 2016-04-29 DIAGNOSIS — R269 Unspecified abnormalities of gait and mobility: Secondary | ICD-10-CM | POA: Diagnosis not present

## 2016-04-29 LAB — CBC
HCT: 37.8 % — ABNORMAL LOW (ref 39.0–52.0)
Hemoglobin: 11.9 g/dL — ABNORMAL LOW (ref 13.0–17.0)
MCHC: 31.6 g/dL (ref 30.0–36.0)
MCV: 80.6 fl (ref 78.0–100.0)
PLATELETS: 215 10*3/uL (ref 150.0–400.0)
RBC: 4.68 Mil/uL (ref 4.22–5.81)
RDW: 16.5 % — ABNORMAL HIGH (ref 11.5–15.5)
WBC: 8.1 10*3/uL (ref 4.0–10.5)

## 2016-04-29 LAB — BASIC METABOLIC PANEL
BUN: 13 mg/dL (ref 6–23)
CALCIUM: 9.6 mg/dL (ref 8.4–10.5)
CO2: 23 meq/L (ref 19–32)
Chloride: 105 mEq/L (ref 96–112)
Creatinine, Ser: 1.38 mg/dL (ref 0.40–1.50)
GFR: 65 mL/min (ref >60.00)
GLUCOSE: 87 mg/dL (ref 70–99)
Potassium: 3.7 mEq/L (ref 3.5–5.1)
SODIUM: 136 meq/L (ref 135–145)

## 2016-04-29 MED ORDER — AMLODIPINE BESYLATE-VALSARTAN 5-320 MG PO TABS: 1.0000 | ORAL_TABLET | Freq: Every day | ORAL | 1 refills | Status: DC

## 2016-04-29 NOTE — Progress Notes (Signed)
Pre visit review using our clinic review tool, if applicable. No additional management support is needed unless otherwise documented below in the visit note. 

## 2016-04-29 NOTE — Progress Notes (Addendum)
Chief Complaint  Patient presents with  . Hospitalization Follow-up    HPI Andrew Cuevas is a 71 y.o. male who presents for a transition of care visit.  Pt was discharged from [facility] on 04/18/16 (admitted on 04/08/16).  Within 48 business hours of discharge our office contacted him via telephone to coordinate his care and needs.   Pt has a hemorrhagic stroke of the L basal ganglia. He was in instructed to f/u with PM&R in addition to Neurology. Due to his stroke, he had some tremors, cognitive slowing/deficits, mild receptive/expressive language deficits, balance issues, and overall ability to complete ADLs. He is scheduled with Neurology on 06/08/16 and PM&R on 05/15/16. He also needs f/u on hydrocele with urology.   He currently is doing well. He is still having testicular pain. He does not have an appt with a urologist. He is not having any weakness. Of note, in his D/C summary, it stated he tested positive for cocaine. This was thought to contribute to his stroke.   Social History   Social History  . Marital status: Single   Social History Main Topics  . Smoking status: Current Some Day Smoker    Packs/day: 0.50    Types: Cigarettes  . Smokeless tobacco: Never Used  . Alcohol use No  . Drug use: No   Social History Narrative   Lives with sister.    Past Medical History:  Diagnosis Date  . Chronic headaches   . CVA (cerebral vascular accident) (HCC)   . HTN (hypertension), malignant    Family History  Problem Relation Age of Onset  . Hypertension Mother   . Arthritis Mother   . Alzheimer's disease Father    Allergies as of 04/29/2016   No Known Allergies     Medication List       Accurate as of 04/29/16 12:20 PM. Always use your most recent med list.          acetaminophen 325 MG tablet Commonly known as:  TYLENOL Take 650 mg by mouth every 6 (six) hours as needed for mild pain.   amLODipine-valsartan 5-320 MG tablet Commonly known as:  EXFORGE Take 1  tablet by mouth daily.   cloNIDine 0.1 MG tablet Commonly known as:  CATAPRES Take 1 tablet (0.1 mg total) by mouth 2 (two) times daily.   FLUoxetine 10 MG capsule Commonly known as:  PROZAC Take 1 capsule (10 mg total) by mouth daily.   pantoprazole 40 MG tablet Commonly known as:  PROTONIX Take 1 tablet (40 mg total) by mouth daily.   senna-docusate 8.6-50 MG tablet Commonly known as:  Senokot-S Take 1 tablet by mouth 2 (two) times daily.       ROS:  Constitutional: No fevers or chills, no weight loss HEENT: No headaches, hearing loss, or runny nose, no sore throat Heart: No chest pain Lungs: No SOB, no cough Abd: No bowel changes, no pain, no N/V GU: No urinary complaints, +testicular pain Neuro: No numbness, tingling or weakness Msk: No joint or muscle pain  Objective BP (!) 152/100 (BP Location: Left Arm, Patient Position: Sitting, Cuff Size: Normal)   Pulse 70   Temp 98.2 F (36.8 C) (Oral)   Resp 16   Ht 5\' 10"  (1.778 m)   Wt 210 lb 3.2 oz (95.3 kg)   SpO2 98%   BMI 30.16 kg/m  General Appearance:  awake, alert, oriented, in no acute distress and well developed, well nourished Skin:  there are no  suspicious lesions or rashes of concern Head/face:  NCAT Eyes:  EOMI, PERRLA Ears:  canals and TMs NI Nose/Sinuses:  negative Mouth/Throat:  Mucosa moist, no lesions; pharynx without erythema, edema or exudate. Neck:  neck- supple, no mass, non-tender and no jvd Lungs: Clear to auscultation.  No rales, rhonchi, or wheezing. Normal effort, no accessory muscle use. Heart:  Heart sounds are normal.  Regular rate and rhythm without murmur, gallop or rub. No bruits. Abdomen:  BS+, soft, NT, ND, no masses or organomegaly Musculoskeletal:  No muscle group atrophy or asymmetry, gait normal, 5/5 strength throughout, symmetric strength as well. Neurologic:  Alert and oriented x 3, gait normal., reflexes normal and symmetric, strength and  sensation grossly normal Psych  exam: Nml mood and affect, mildly limited judgment and insight  Hospital discharge follow-up - Plan: Basic Metabolic Panel (BMET), CBC  Hydrocele, unspecified hydrocele type - Plan: Ambulatory referral to Urology  Discharge summary and medication list have been reviewed/reconciled.  Labs pending at the time of discharge have been reviewed or are still pending at the time of this visit.  Follow-up labs and appointments have been ordered and/or coordinated appropriately. Educational materials regarding the patient's admitting diagnosis provided.  TRANSITIONAL CARE MANAGEMENT CERTIFICATION:  I certify the following are true:   1. Communication with the patient/care giver was made within 2 business days of discharge.  2. Complexity of Medical decision making is moderate 3. Face to face visit occurred within 14 days of discharge.  4. Strongly encouraged him to abstain from cocaine and other illicit drugs.  5. He is to follow with Neurology and PM&R regarding his deficits stemming from stroke. F/u in 1 mo to recheck BP- will add chlorthalidone if no improvement vs Spironolactone. The patient and his brother voiced understanding and agreement to the plan.  [name] TowWendling, DO 04/29/16 12:20 PM

## 2016-04-29 NOTE — Patient Instructions (Addendum)
If you don't hear anything about your urology appointment, call our office to ask for an update.  Keep your appointment with Neurology and PM&R.

## 2016-05-04 ENCOUNTER — Encounter: Payer: Self-pay | Admitting: *Deleted

## 2016-05-05 DIAGNOSIS — I10 Essential (primary) hypertension: Secondary | ICD-10-CM | POA: Diagnosis not present

## 2016-05-05 DIAGNOSIS — N433 Hydrocele, unspecified: Secondary | ICD-10-CM | POA: Diagnosis not present

## 2016-05-05 DIAGNOSIS — I69351 Hemiplegia and hemiparesis following cerebral infarction affecting right dominant side: Secondary | ICD-10-CM | POA: Diagnosis not present

## 2016-05-15 ENCOUNTER — Encounter: Payer: Self-pay | Admitting: Physical Medicine & Rehabilitation

## 2016-05-15 ENCOUNTER — Encounter: Payer: Medicare Other | Attending: Physical Medicine & Rehabilitation

## 2016-05-15 ENCOUNTER — Ambulatory Visit (HOSPITAL_BASED_OUTPATIENT_CLINIC_OR_DEPARTMENT_OTHER): Payer: Medicare Other | Admitting: Physical Medicine & Rehabilitation

## 2016-05-15 VITALS — BP 183/100 | HR 87

## 2016-05-15 DIAGNOSIS — I69118 Other symptoms and signs involving cognitive functions following nontraumatic intracerebral hemorrhage: Secondary | ICD-10-CM | POA: Diagnosis not present

## 2016-05-15 DIAGNOSIS — I69159 Hemiplegia and hemiparesis following nontraumatic intracerebral hemorrhage affecting unspecified side: Secondary | ICD-10-CM | POA: Insufficient documentation

## 2016-05-15 DIAGNOSIS — I69198 Other sequelae of nontraumatic intracerebral hemorrhage: Secondary | ICD-10-CM | POA: Insufficient documentation

## 2016-05-15 DIAGNOSIS — I6930 Unspecified sequelae of cerebral infarction: Secondary | ICD-10-CM

## 2016-05-15 DIAGNOSIS — R269 Unspecified abnormalities of gait and mobility: Secondary | ICD-10-CM | POA: Diagnosis not present

## 2016-05-15 DIAGNOSIS — F17210 Nicotine dependence, cigarettes, uncomplicated: Secondary | ICD-10-CM | POA: Insufficient documentation

## 2016-05-15 DIAGNOSIS — Z8261 Family history of arthritis: Secondary | ICD-10-CM | POA: Insufficient documentation

## 2016-05-15 DIAGNOSIS — I10 Essential (primary) hypertension: Secondary | ICD-10-CM | POA: Diagnosis not present

## 2016-05-15 DIAGNOSIS — I69398 Other sequelae of cerebral infarction: Secondary | ICD-10-CM | POA: Diagnosis not present

## 2016-05-15 DIAGNOSIS — I619 Nontraumatic intracerebral hemorrhage, unspecified: Secondary | ICD-10-CM | POA: Diagnosis not present

## 2016-05-15 DIAGNOSIS — Z8249 Family history of ischemic heart disease and other diseases of the circulatory system: Secondary | ICD-10-CM | POA: Insufficient documentation

## 2016-05-15 NOTE — Progress Notes (Signed)
Subjective:    Patient ID: Andrew AbbottJohn Cuevas, male    DOB: Apr 06, 1945, 71 y.o.   MRN: [id number] Admit date: 04/08/2016 Discharge date: 04/18/2016 71 y.o. male with history of HTN, chronic HA, prior CVA without significant residual deficits who presented to ED on 04/02/16 with one month history of testicular pain and was found to have right sided weakness, soft voice and mild facial weakness. History taken from chart review and patient. BP elevated at admission 245/133 and he was started on cardene drip. Left testicular pain felt to be due to very large left sided hydrocele--no intervention needed.  CT head reviewed, showing acute left basal ganglial hemorrhage with mild edema and atrophy.  CTA head and neck without stenosis, dissection, plaque or AVM. 2D echo with EF 65-70% with mild concentric LVH and grade 2 diastolic dysfunction. UDS positive for cocaine. BLE dopplers negative for DVT. Andrew. Roda Cuevas felt that bleed due to HTN emergency and cocaine abuse. Therapy ongoing and patient noted to have poor safety with ADL tasks, problems sequencing, balance deficits with festinating gait.     HPI  F/u Transitional care PCP Andrew Cuevas  Dressing and bathing independent, no equipment Ambulating without device No falls at home Lives at sister's place, remembers breakfast Denies drug use or smoking HH therapy finished  Patient feels like his walking is not as quick as usual. He is willing to undergo additional therapy to work on this.  Pain Inventory Average Pain 0 Pain Right Now 0 My pain is na  In the last 24 hours, has pain interfered with the following? General activity na Relation with others na Enjoyment of life na What TIME of day is your pain at its worst? na Sleep (in general) na  Pain is worse with: na Pain improves with: na Relief from Meds: na  Mobility walk without assistance ability to climb steps?  yes do you drive?  no  Function retired  Neuro/Psych No problems in this  area  Prior Studies Any changes since last visit?  no  Physicians involved in your care Primary care Andrew Cuevas   Family History  Problem Relation Age of Onset  . Hypertension Mother   . Arthritis Mother   . Alzheimer's disease Father    Social History   Social History  . Marital status: Single    Spouse name: N/A  . Number of children: N/A  . Years of education: N/A   Social History Main Topics  . Smoking status: Current Some Day Smoker    Packs/day: 0.50    Types: Cigarettes  . Smokeless tobacco: Never Used  . Alcohol use No  . Drug use: No  . Sexual activity: Not on file   Other Topics Concern  . Not on file   Social History Narrative   Lives with sister.    Past Surgical History:  Procedure Laterality Date  . HERNIA REPAIR Bilateral    Past Medical History:  Diagnosis Date  . Chronic headaches   . CVA (cerebral vascular accident) (HCC)   . HTN (hypertension), malignant    There were no vitals taken for this visit.  Opioid Risk Score:   Fall Risk Score:  `1  Depression screen PHQ 2/9  No flowsheet data found.  Review of Systems  Constitutional: Negative.   HENT: Negative.   Eyes: Negative.   Respiratory: Negative.   Cardiovascular: Negative.   Gastrointestinal: Negative.   Endocrine: Negative.   Genitourinary: Negative.   Musculoskeletal: Negative.   Skin:  Negative.   Allergic/Immunologic: Negative.   Neurological: Negative.   Hematological: Negative.   Psychiatric/Behavioral: Negative.        Objective:   Physical Exam  Constitutional: He appears well-developed and well-nourished.  HENT:  Head: Normocephalic and atraumatic.  Eyes: Conjunctivae and EOM are normal. Pupils are equal, round, and reactive to light.  Cardiovascular: Normal rate and regular rhythm.  Exam reveals no friction rub.   No murmur heard. Pulmonary/Chest: Effort normal and breath sounds normal. No respiratory distress. He has no wheezes.  Abdominal:  Soft. Bowel sounds are normal. He exhibits no distension. There is no tenderness.  Neurological: He is alert. No sensory deficit. He exhibits abnormal muscle tone. Gait abnormal.  Reflex Scores:      Tricep reflexes are 1+ on the right side and 1+ on the left side.      Bicep reflexes are 1+ on the right side and 1+ on the left side.      Brachioradialis reflexes are 1+ on the right side and 1+ on the left side.      Patellar reflexes are 1+ on the right side and 1+ on the left side.      Achilles reflexes are 1+ on the right side and 1+ on the left side. Psychiatric: His affect is blunt. His speech is delayed. He is slowed. He exhibits abnormal recent memory.  Nursing note and vitals reviewed.   Remembers 2/3 objects after 2 min delay  Alert oriented to person, month off by 1 on date, correct with Friday Motor strength is 4/5 in the right tricep. 5. The bicep 5 in the deltoid, 5, and the grip 5/5 on the left side. 5/5 bilateral hip flexor, knee extensor, ankle dorsal flexor. Romberg is negative. Ambulates with wide base of support, tends to lean towards the right. Unable to do tandem gait. Heel walk or toe walk. Cadence is reduced      Assessment & Plan:  1. Left basal ganglia hemorrhage, hemiparesis has largely improved, but still has gait disorder as well as some cognitive slowing. Patient doesn't feel that he is much different cognitively nor does his brother. We discussed follow-up with neuro psych which she declines at the current time. He would like to work on his walking and I have made a referral to outpatient PT. His brother can drive him. 9. Continue with 24 7 supervision at his sister's home Physical medicine and rehabilitation follow-up in one month. Neuro follow-up in 3 weeks. PCP follow-up next month

## 2016-05-25 ENCOUNTER — Telehealth: Payer: Self-pay | Admitting: *Deleted

## 2016-05-25 MED ORDER — SENNOSIDES-DOCUSATE SODIUM 8.6-50 MG PO TABS: 1.0000 | ORAL_TABLET | Freq: Two times a day (BID) | ORAL | 0 refills | Status: DC

## 2016-05-25 MED ORDER — FLUOXETINE HCL 10 MG PO CAPS: 10.0000 mg | ORAL_CAPSULE | Freq: Every day | ORAL | 0 refills | Status: DC

## 2016-05-25 MED ORDER — PANTOPRAZOLE SODIUM 40 MG PO TBEC: 40.0000 mg | DELAYED_RELEASE_TABLET | Freq: Every day | ORAL | 0 refills | Status: DC

## 2016-05-25 NOTE — Telephone Encounter (Signed)
REFILL 

## 2016-06-08 ENCOUNTER — Encounter: Payer: Self-pay | Admitting: Neurology

## 2016-06-08 ENCOUNTER — Ambulatory Visit (INDEPENDENT_AMBULATORY_CARE_PROVIDER_SITE_OTHER): Payer: Medicare Other | Admitting: Neurology

## 2016-06-08 ENCOUNTER — Encounter (INDEPENDENT_AMBULATORY_CARE_PROVIDER_SITE_OTHER): Payer: Self-pay

## 2016-06-08 VITALS — BP 195/112 | HR 113 | Ht 70.0 in | Wt 200.6 lb

## 2016-06-08 DIAGNOSIS — I610 Nontraumatic intracerebral hemorrhage in hemisphere, subcortical: Secondary | ICD-10-CM

## 2016-06-08 NOTE — Patient Instructions (Signed)
I had a long d/w patient and his brother about his recent hemorrhagic stroke, risk for recurrent stroke/TIAs, personally independently reviewed imaging studies and stroke evaluation results and answered questions.Continue to maintain strict control of hypertension with blood pressure goal below 130/90, diabetes with hemoglobin A1c goal below 6.5% and lipids with LDL cholesterol goal below 70 mg/dL. I also advised the patient to eat a healthy diet with plenty of whole grains, cereals, fruits and vegetables, exercise regularly and maintain ideal body weight. He was advised to keep his upcoming appointment with his you urologist to treat his hydrocele and testicular pain. He was encouraged to participate in physical and occupational therapy. 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 We will consider treatment trial and follow-up visit in 3 months with my nurse practitioner

## 2016-06-08 NOTE — Progress Notes (Signed)
[facility] [address] [place]. [place] [address] [phone]       OFFICE FOLLOW-UP NOTE  [name] Date of Birth:  [birth year] Medical Record Number:  [id number]   HPI: 43 year African-American male seen today for first office follow-up visit following hospital admission for intracerebral hemorrhage in February 2018. He is accompanied today by his brother. History is obtained from the patient, brother and reactive hospital medical records. I personally reviewed brain imaging films.Andrew [name] a 71 y.o.malewith an unclear last seen well given that he did not really notice the symptoms that much but does state that he has had worse difficult to walking recently. It is suspected that after arriving here, he developed right-sided mild hemiparesis but has been stable over the past few hours. A CT was done which showed a  4.4 x 1.6 cm basal ganglia hemorrhage.there was mild surrounding edema but no midline shift or hydrocephalus He actually presented due to testicular pain which the ED evaluated and feels is hydrocele with no need for acute management. He was last known well is unclear. ICH Score: 0. Hewas admitted to the neuro ICU for further evaluation and treatment.  his blood pressure was significantly elevated  at 245/133 on admission requiring ICU admission but blood pressure drops for control. CT angiogram of the brain and neck was obtained which was negative for any AV malformation or aneurysm. Follow-up CT scan showed stable appearance of the hematoma. Transthoracic echo showed normal ejection fraction. LDL cholesterol was 69 mg percent and hemoglobin A1c was 5.7. Urine drug screen was positive for cocaine. She was counseled to quit cocaine as well as smoking cigarettes. Patient did well during hospitalization and mild right-sided weakness improved. Did have significant scrotal swelling and underwent ultrasound of the scrotum which showed hydrocephalus and small  benign-appearing bilateral intratesticular cyst. Patient was discharged home with instructions for outpatient physical occupational therapy as well as see his primary care physician to be referred to a urologist. Patient has done well since discharge without any increase in physical weakness however family has noticed some resting tremors of his hands. He has had difficulty walking mostly due to discomfort from his hydrocele and testicular pain. He has not yet seen a urologist but has an appointment for 1 week from now. Patient denies any headache and slurred speech and bradykinesia. His blood pressure is apparently well controlled but today was found to be elevated at 195/112 in office but he was trembling a lot. When I checked his blood pressure myself I was not able to get a reliable sounds with the stethoscope but manual blood pressure checked to systolic of 150.   ROS:   14 system review of systems is positive for  testicular pain, difficulty walking, tremors, memory loss and all other systems negative PMH:  Past Medical History:  Diagnosis Date  . Chronic headaches   . CVA (cerebral vascular accident) (HCC)   . HTN (hypertension), malignant     Social History:  Social History   Social History  . Marital status: Single    Spouse name: N/A  . Number of children: N/A  . Years of education: N/A   Occupational History  . Not on file.   Social History Main Topics  . Smoking status: Former Smoker    Packs/day: 0.50    Types: Cigarettes    Quit date: 04/27/2016  . Smokeless tobacco: Never Used  . Alcohol use No  . Drug use: Yes    Types: "  Crack" cocaine  . Sexual activity: Not on file   Other Topics Concern  . Not on file   Social History Narrative   Lives with sister.     Medications:   Current Outpatient Prescriptions on File Prior to Visit  Medication Sig Dispense Refill  . acetaminophen (TYLENOL) 325 MG tablet Take 650 mg by mouth every 6 (six) hours as needed for mild  pain.    Marland Kitchen amLODipine-valsartan (EXFORGE) 5-320 MG tablet Take 1 tablet by mouth daily. 30 tablet 1  . cloNIDine (CATAPRES) 0.1 MG tablet Take 1 tablet (0.1 mg total) by mouth 2 (two) times daily. 60 tablet 0  . FLUoxetine (PROZAC) 10 MG capsule Take 1 capsule (10 mg total) by mouth daily. 30 capsule 0  . pantoprazole (PROTONIX) 40 MG tablet Take 1 tablet (40 mg total) by mouth daily. 30 tablet 0  . senna-docusate (SENOKOT-S) 8.6-50 MG tablet Take 1 tablet by mouth 2 (two) times daily. 60 tablet 0   No current facility-administered medications on file prior to visit.     Allergies:  No Known Allergies  Physical Exam General: Frail elderly African-American male, seated, in no evident distress Head: head normocephalic and atraumatic.  Neck: supple with no carotid or supraclavicular bruits Cardiovascular: regular rate and rhythm, no murmurs Musculoskeletal: no deformity Skin:  no rash/petichiae Vascular:  Normal pulses all extremities Vitals:   06/08/16 1503  BP: (!) 195/112  Pulse: (!) 113   Neurologic Exam Mental Status: Awake and fully alert. Oriented to place and time. Diminished recall 1/3 but remote memory intact. Attention span, concentration and fund of knowledge appropriate. Mood and affect appropriate. Diminished animal naming 4 only. Cranial Nerves: Fundoscopic exam reveals sharp disc margins. Pupils equal, briskly reactive to light. Extraocular movements full without nystagmus. Visual fields full to confrontation. Hearing intact. Facial sensation intact. Face, tongue, palate moves normally and symmetrically.  Motor: Normal bulk and tone. Normal strength in all tested extremity muscles. Sensory.: intact to touch ,pinprick .position and vibratory sensation.  Coordination: Rapid alternating movements normal in all extremities. Finger-to-nose and heel-to-shin performed accurately bilaterally. Gait and Station: Arises from chair without difficulty. Stance is normal. Gait  demonstrates normal stride length and balance . Able to heel, toe and tandem walk without difficulty.  Reflexes: 1+ and symmetric. Toes downgoing.   NIHSS  1 Modified Rankin  2  ASSESSMENT: 2 year African-American male with left basal ganglia hemorrhage in February 2018 secondary to malignant hypertension. New mild post stroke tremors and parkinsonism.    PLAN: I had a long d/w patient and his brother about his recent hemorrhagic stroke, risk for recurrent stroke/TIAs, personally independently reviewed imaging studies and stroke evaluation results and answered questions.Continue to maintain strict control of hypertension with blood pressure goal below 130/90, diabetes with hemoglobin A1c goal below 6.5% and lipids with LDL cholesterol goal below 70 mg/dL. I also advised the patient to eat a healthy diet with plenty of whole grains, cereals, fruits and vegetables, exercise regularly and maintain ideal body weight. He was advised to keep his upcoming appointment with his you urologist to treat his hydrocele and testicular pain. He was encouraged to participate in physical and occupational therapy. 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 We will consider treatment trial and follow-up visit in 3 months with my nurse practitioner Greater than 50% of time during this 30 minute visit was spent on counseling,explanation of diagnosis of intracerebral hemorrhage, malignant hypertension, planning of further  management, discussion with patient and family and coordination of care Delia Heady, MD  [facility] [address] [place], [place] [address]  Phone [phone] Fax [phone] Note: This document was prepared with digital dictation and possible smart phrase technology. Any transcriptional errors that result from this process are unintentional

## 2016-06-12 ENCOUNTER — Ambulatory Visit: Payer: Medicare Other | Admitting: Physical Medicine & Rehabilitation

## 2016-06-15 ENCOUNTER — Ambulatory Visit: Payer: Medicare Other | Admitting: Physical Medicine & Rehabilitation

## 2016-06-16 ENCOUNTER — Encounter (HOSPITAL_COMMUNITY): Payer: Self-pay | Admitting: Emergency Medicine

## 2016-06-16 ENCOUNTER — Emergency Department (HOSPITAL_COMMUNITY): Payer: Medicare Other

## 2016-06-16 ENCOUNTER — Other Ambulatory Visit: Payer: Self-pay | Admitting: Urology

## 2016-06-16 ENCOUNTER — Emergency Department (HOSPITAL_COMMUNITY)
Admission: EM | Admit: 2016-06-16 | Discharge: 2016-06-16 | Disposition: A | Discharge status: Home / Self Care | Payer: Medicare Other | Attending: Emergency Medicine | Admitting: Emergency Medicine

## 2016-06-16 ENCOUNTER — Ambulatory Visit: Payer: Medicare Other | Admitting: Physical Medicine & Rehabilitation

## 2016-06-16 DIAGNOSIS — Z79899 Other long term (current) drug therapy: Secondary | ICD-10-CM | POA: Diagnosis not present

## 2016-06-16 DIAGNOSIS — N183 Chronic kidney disease, stage 3 (moderate): Secondary | ICD-10-CM | POA: Diagnosis not present

## 2016-06-16 DIAGNOSIS — Z8673 Personal history of transient ischemic attack (TIA), and cerebral infarction without residual deficits: Secondary | ICD-10-CM | POA: Diagnosis not present

## 2016-06-16 DIAGNOSIS — N289 Disorder of kidney and ureter, unspecified: Secondary | ICD-10-CM

## 2016-06-16 DIAGNOSIS — R51 Headache: Secondary | ICD-10-CM | POA: Insufficient documentation

## 2016-06-16 DIAGNOSIS — I129 Hypertensive chronic kidney disease with stage 1 through stage 4 chronic kidney disease, or unspecified chronic kidney disease: Secondary | ICD-10-CM | POA: Diagnosis not present

## 2016-06-16 DIAGNOSIS — Z87891 Personal history of nicotine dependence: Secondary | ICD-10-CM | POA: Diagnosis not present

## 2016-06-16 DIAGNOSIS — R519 Headache, unspecified: Secondary | ICD-10-CM

## 2016-06-16 DIAGNOSIS — I10 Essential (primary) hypertension: Secondary | ICD-10-CM

## 2016-06-16 LAB — URINALYSIS, ROUTINE W REFLEX MICROSCOPIC
BACTERIA UA: NONE SEEN
Bilirubin Urine: NEGATIVE
Glucose, UA: NEGATIVE mg/dL
KETONES UR: 5 mg/dL — AB
Leukocytes, UA: NEGATIVE
NITRITE: NEGATIVE
PH: 5 (ref 5.0–8.0)
Protein, ur: 100 mg/dL — AB
Specific Gravity, Urine: 1.021 (ref 1.005–1.030)

## 2016-06-16 LAB — CBC
HEMATOCRIT: 41.6 % (ref 39.0–52.0)
Hemoglobin: 13.5 g/dL (ref 13.0–17.0)
MCH: 26.1 pg (ref 26.0–34.0)
MCHC: 32.5 g/dL (ref 30.0–36.0)
MCV: 80.3 fL (ref 78.0–100.0)
Platelets: 220 10*3/uL (ref 150–400)
RBC: 5.18 MIL/uL (ref 4.22–5.81)
RDW: 15.2 % (ref 11.5–15.5)
WBC: 12 10*3/uL — ABNORMAL HIGH (ref 4.0–10.5)

## 2016-06-16 LAB — BASIC METABOLIC PANEL
Anion gap: 11 (ref 5–15)
BUN: 15 mg/dL (ref 6–20)
CHLORIDE: 109 mmol/L (ref 101–111)
CO2: 18 mmol/L — ABNORMAL LOW (ref 22–32)
Calcium: 9.7 mg/dL (ref 8.9–10.3)
Creatinine, Ser: 1.37 mg/dL — ABNORMAL HIGH (ref 0.61–1.24)
GFR calc Af Amer: 58 mL/min — ABNORMAL LOW (ref >60)
GFR calc non Af Amer: 50 mL/min — ABNORMAL LOW (ref >60)
Glucose, Bld: 112 mg/dL — ABNORMAL HIGH (ref 65–99)
POTASSIUM: 3.5 mmol/L (ref 3.5–5.1)
Sodium: 138 mmol/L (ref 135–145)

## 2016-06-16 MED ORDER — SODIUM CHLORIDE 0.9 % IV BOLUS (SEPSIS)
1000.0000 mL | Freq: Once | INTRAVENOUS | Status: AC
  Administered 2016-06-16: 1000 mL via INTRAVENOUS

## 2016-06-16 NOTE — ED Notes (Signed)
Pt is in stable condition upon d/c and is escorted from ED via wheelchair. 

## 2016-06-16 NOTE — ED Triage Notes (Signed)
Pt arrives to ED with headache that he states he has had for over a week now. Pt is alert and ox4. Grip strength equal speech clear.

## 2016-06-16 NOTE — ED Notes (Signed)
Pt needs assistance standing, normally walks independently. Pt states he is weaker than normal.  Pt moved in with sister in January.

## 2016-06-16 NOTE — Discharge Instructions (Signed)
Return to the ED with any concerns including changes in vision or speech,  weakness in arms or legs that is new or changed, fainting, difficulty breathing, vomiting and not able to keep down liquids, decreased level of alertness/lethargy, or any other alarming symptoms

## 2016-06-16 NOTE — ED Provider Notes (Signed)
[facility]-EMERGENCY DEPT Provider Note   CSN: [id number] Arrival date & time: 06/16/16  1053     History   Chief Complaint Chief Complaint  Patient presents with  . Headache    HPI Andrew Cuevas is a 70 y.o. male.  HPI  Pt with hx of recent CVA and chronic headaches as well as HTN presenting with c/o generalized weakness, ongoing headache as well as decreased appetite.  Pt states he has headache and doesn't feel good.  His sister spoke to me on the phone and feels he hasn't been eating and drinking well, she feels he appears more weak than usual over the past several days, his appetite is decreased.  She also notes he has some depression concerning the death of his wife one year ago.  Pt states he has chronic headaches, no acute onset.  No focal weakness.  He has some mild weakness residual from his recent stroke, but this is much improved.  There are no other associated systemic symptoms, there are no other alleviating or modifying factors.   Past Medical History:  Diagnosis Date  . Chronic headaches   . CVA (cerebral vascular accident) (HCC)   . HTN (hypertension), malignant     Patient Active Problem List   Diagnosis Date Noted  . Reactive depression   . Infiltrate noted on imaging study   . Cough   . Abnormal chest x-ray   . Stage 3 chronic kidney disease   . Nontraumatic acute hemorrhage of basal ganglia (HCC) 04/08/2016  . Vitamin B 12 deficiency   . Cocaine abuse   . Right hemiparesis (HCC)   . Hemorrhagic stroke (HCC)   . Left hydrocele   . Tobacco abuse   . Diastolic dysfunction   . Stage 2 chronic kidney disease   . Benign essential HTN   . Chronic nonintractable headache   . History of CVA with residual deficit   . Acute blood loss anemia   . Intractable hiccups   . ICH (intracerebral hemorrhage) (HCC)   . Stroke (cerebrum) (HCC) 04/02/2016  . Hypertensive heart disease without heart failure 03/28/2016    Past Surgical History:  Procedure Laterality Date  .  HERNIA REPAIR Bilateral        Home Medications    Prior to Admission medications   Medication Sig Start Date End Date Taking? Authorizing Provider  acetaminophen (TYLENOL) 325 MG tablet Take 650 mg by mouth every 6 (six) hours as needed for mild pain.   Yes Historical Provider, MD  amLODipine-valsartan (EXFORGE) 5-320 MG tablet Take 1 tablet by mouth daily. 04/29/16  Yes Jilda Roche Wendling, DO  cloNIDine (CATAPRES) 0.1 MG tablet Take 1 tablet (0.1 mg total) by mouth 2 (two) times daily. 04/17/16  Yes Evlyn Kanner Love, PA-C  FLUoxetine (PROZAC) 10 MG capsule Take 1 capsule (10 mg total) by mouth daily. 05/25/16  Yes Erick Colace, MD  lisinopril (PRINIVIL,ZESTRIL) 20 MG tablet TAKE 1 TABLET (20 MG TOTAL) BY MOUTH DAILY. 03/25/16  Yes Historical Provider, MD  pantoprazole (PROTONIX) 40 MG tablet Take 1 tablet (40 mg total) by mouth daily. 05/25/16  Yes Erick Colace, MD  senna-docusate (SENOKOT-S) 8.6-50 MG tablet Take 1 tablet by mouth 2 (two) times daily. 05/25/16  Yes Erick Colace, MD  amLODipine (NORVASC) 5 MG tablet TAKE 1 TABLET (5 MG TOTAL) BY MOUTH DAILY. 04/17/16   Historical Provider, MD    Family History Family History  Problem Relation Age of Onset  . Hypertension Mother   .  Arthritis Mother   . Alzheimer's disease Father   . Stroke Sister     Social History Social History  Substance Use Topics  . Smoking status: Former Smoker    Packs/day: 0.50    Types: Cigarettes    Quit date: 04/27/2016  . Smokeless tobacco: Never Used  . Alcohol use No     Allergies   Patient has no known allergies.   Review of Systems Review of Systems  ROS reviewed and all otherwise negative except for mentioned in HPI   Physical Exam Updated Vital Signs BP (!) 158/99   Pulse 82   Temp (S) 99.7 F (37.6 C) (Oral)   Resp 16   SpO2 92%  Vitals reviewed Physical Exam Physical Examination: General appearance - alert, well appearing, and in no distress Mental status -  alert, oriented to person, place, and time Eyes - pupils equal and reactive, extraocular eye movements intact Mouth - mucous membranes moist, pharynx normal without lesions Chest - clear to auscultation, no wheezes, rales or rhonchi, symmetric air entry Heart - normal rate, regular rhythm, normal S1, S2, no murmurs, rubs, clicks or gallops Abdomen - soft, nontender, nondistended, no masses or organomegaly Neurological - alert, oriented x 3, normal speech, no focal findings or hesitant gait, fine tremor- noted in prior followup notes from stroke, flat affect Extremities - peripheral pulses normal, no pedal edema, no clubbing or cyanosis Skin - normal coloration and turgor, no rashes  ED Treatments / Results  Labs (all labs ordered are listed, but only abnormal results are displayed) Labs Reviewed  BASIC METABOLIC PANEL - Abnormal; Notable for the following:       Result Value   CO2 18 (*)    Glucose, Bld 112 (*)    Creatinine, Ser 1.37 (*)    GFR calc non Af Amer 50 (*)    GFR calc Af Amer 58 (*)    All other components within normal limits  CBC - Abnormal; Notable for the following:    WBC 12.0 (*)    All other components within normal limits  URINALYSIS, ROUTINE W REFLEX MICROSCOPIC - Abnormal; Notable for the following:    Hgb urine dipstick SMALL (*)    Ketones, ur 5 (*)    Protein, ur 100 (*)    Squamous Epithelial / LPF 0-5 (*)    All other components within normal limits    EKG  EKG Interpretation  Date/Time:  Tuesday June 16 2016 11:02:09 EDT Ventricular Rate:  122 PR Interval:  134 QRS Duration: 138 QT Interval:  352 QTC Calculation: 501 R Axis:   76 Text Interpretation:  Sinus tachycardia Left ventricular hypertrophy with QRS widening and repolarization abnormality Abnormal ECG consistent with old with rate related changes Confirmed by Donnald Garre, MD, Lebron Conners [id number]) on 06/16/2016 11:14:18 AM       Radiology Ct Head Wo Contrast  Result Date: 06/16/2016 CLINICAL  DATA:  Frontal headaches for 2 days. History of hypertension. EXAM: CT HEAD WITHOUT CONTRAST TECHNIQUE: Contiguous axial images were obtained from the base of the skull through the vertex without intravenous contrast. COMPARISON:  CT head 04/02/2016.  CTA head 04/03/2016. FINDINGS: Brain: No evidence for acute infarction, hemorrhage, mass lesion, hydrocephalus, or extra-axial fluid. Moderate atrophy. Resolved LEFT basal ganglia intracerebral hematoma. Chronic RIGHT internal capsule/periventricular white matter infarct. Global chronic microvascular ischemic change throughout the white matter. Vascular: No hyperdense vessel or unexpected calcification. Skull: Normal. Negative for fracture or focal lesion. Sinuses/Orbits: No acute finding. Other: None.  IMPRESSION: Chronic changes as described. Resolution of the most recently observed LEFT basal ganglia hemorrhage without any new acute or significant findings. Electronically Signed   By: Elsie Stain M.D.   On: 06/16/2016 12:46    Procedures Procedures (including critical care time)  Medications Ordered in ED Medications  sodium chloride 0.9 % bolus 1,000 mL (0 mLs Intravenous Stopped 06/16/16 1420)     Initial Impression / Assessment and Plan / ED Course  I have reviewed the triage vital signs and the nursing notes.  Pertinent labs & imaging results that were available during my care of the patient were reviewed by me and considered in my medical decision making (see chart for details).     Pt presenting with c/o generalized weakness and ongoing headache.  On arrival HR 128, this improved with IV fluids, per report he is not eating/drinking well at home.  Neuro exam is at baseline with some fine tremor- head CT negative for acute changes- resolving CVA area from recent stroke.  Pt tolerating po in the ED.  No sign of UTI or other focal infection.  Discharged with strict return precautions.  Pt agreeable with plan.  Final Clinical Impressions(s) /  ED Diagnoses   Final diagnoses:  Bad headache  Essential hypertension  Renal insufficiency    New Prescriptions Discharge Medication List as of 06/16/2016  4:19 PM       Jerelyn Scott, MD 06/17/16 1711

## 2016-06-16 NOTE — ED Notes (Signed)
I helped Pt to use urinal. Pt was not able to go

## 2016-06-21 ENCOUNTER — Other Ambulatory Visit: Payer: Self-pay | Admitting: Physical Medicine & Rehabilitation

## 2016-06-24 ENCOUNTER — Other Ambulatory Visit: Payer: Self-pay | Admitting: Physical Medicine & Rehabilitation

## 2016-06-24 ENCOUNTER — Other Ambulatory Visit: Payer: Self-pay | Admitting: Family Medicine

## 2016-06-25 ENCOUNTER — Telehealth: Payer: Self-pay | Admitting: *Deleted

## 2016-06-25 NOTE — Telephone Encounter (Signed)
AWV scheduled for 06/29/16 

## 2016-06-25 NOTE — Telephone Encounter (Signed)
Rx sent to the pharmacy by e-script.//AB/CMA 

## 2016-06-26 NOTE — Progress Notes (Deleted)
Pre visit review using our clinic review tool, if applicable. No additional management support is needed unless otherwise documented below in the visit note. 

## 2016-06-26 NOTE — Progress Notes (Deleted)
Subjective:   Andrew Cuevas is a 71 y.o. male who presents for an Initial Medicare Annual Wellness Visit.  Review of Systems  No ROS.  Medicare Wellness Visit.   Sleep patterns: {SX; SLEEP PATTERNS:18802::"feels rested on waking","does not get up to void","gets up *** times nightly to void","sleeps *** hours nightly"}.   Home Safety/Smoke Alarms:   Living environment; residence and Firearm Safety: {Rehab home environment / accessibility:30080::"no firearms","firearms stored safely"}. Seat Belt Safety/Bike Helmet: Wears seat belt.   Counseling:   Eye Exam-  Dental-  Male:   CCS-     PSA- No results found for: PSA     Objective:    There were no vitals filed for this visit. There is no height or weight on file to calculate BMI.  Current Medications (verified) Outpatient Encounter Prescriptions as of 06/29/2016  Medication Sig  . acetaminophen (TYLENOL) 325 MG tablet Take 650 mg by mouth every 6 (six) hours as needed for mild pain.  Marland Kitchen amLODipine (NORVASC) 5 MG tablet TAKE 1 TABLET (5 MG TOTAL) BY MOUTH DAILY.  Marland Kitchen amLODipine-valsartan (EXFORGE) 5-320 MG tablet TAKE 1 TABLET BY MOUTH EVERY DAY  . cloNIDine (CATAPRES) 0.1 MG tablet Take 1 tablet (0.1 mg total) by mouth 2 (two) times daily.  Marland Kitchen FLUoxetine (PROZAC) 10 MG capsule Take 1 capsule (10 mg total) by mouth daily.  Marland Kitchen lisinopril (PRINIVIL,ZESTRIL) 20 MG tablet TAKE 1 TABLET (20 MG TOTAL) BY MOUTH DAILY.  . pantoprazole (PROTONIX) 40 MG tablet Take 1 tablet (40 mg total) by mouth daily.  Marland Kitchen senna-docusate (SENOKOT-S) 8.6-50 MG tablet Take 1 tablet by mouth 2 (two) times daily.   No facility-administered encounter medications on file as of 06/29/2016.     Allergies (verified) Patient has no known allergies.   History: Past Medical History:  Diagnosis Date  . Chronic headaches   . CVA (cerebral vascular accident) (HCC)   . HTN (hypertension), malignant    Past Surgical History:  Procedure Laterality Date  . HERNIA  REPAIR Bilateral    Family History  Problem Relation Age of Onset  . Hypertension Mother   . Arthritis Mother   . Alzheimer's disease Father   . Stroke Sister    Social History   Occupational History  . Not on file.   Social History Main Topics  . Smoking status: Former Smoker    Packs/day: 0.50    Types: Cigarettes    Quit date: 04/27/2016  . Smokeless tobacco: Never Used  . Alcohol use No  . Drug use: Yes    Types: "Crack" cocaine  . Sexual activity: Not on file   Tobacco Counseling Counseling given: Not Answered   Activities of Daily Living In your present state of health, do you have any difficulty performing the following activities: 04/08/2016 04/08/2016  Hearing? - N  Vision? - N  Difficulty concentrating or making decisions? - N  Walking or climbing stairs? - Y  Dressing or bathing? - Y  Doing errands, shopping? Y -    Immunizations and Health Maintenance  There is no immunization history on file for this patient. Health Maintenance Due  Topic Date Due  . TETANUS/TDAP  07/26/1962  . COLONOSCOPY  07/25/1993  . PNA vac Low Risk Adult (1 of 2 - PCV13) 07/25/2008    Patient Care Team: Sharlene Dory, DO as PCP - General (Family Medicine)  Indicate any recent Medical Services you may have received from other than [facility] providers in the past year (date may be  approximate).    Assessment:   This is a routine wellness examination for Andrew Cuevas. ***  Hearing/Vision screen No exam data present  Dietary issues and exercise activities discussed:    Goals    None     Depression Screen No flowsheet data found.  Fall Risk Fall Risk  06/08/2016  Falls in the past year? No    Cognitive Function:        Screening Tests Health Maintenance  Topic Date Due  . TETANUS/TDAP  07/26/1962  . COLONOSCOPY  07/25/1993  . PNA vac Low Risk Adult (1 of 2 - PCV13) 07/25/2008  . INFLUENZA VACCINE  09/30/2016  . Hepatitis C Screening  Completed        Plan:     ***  I have personally reviewed and noted the following in the patient's chart:   . Medical and social history . Use of alcohol, tobacco or illicit drugs  . Current medications and supplements . Functional ability and status . Nutritional status . Physical activity . Advanced directives . List of other physicians . Hospitalizations, surgeries, and ER visits in previous 12 months . Vitals . Screenings to include cognitive, depression, and falls . Referrals and appointments  In addition, I have reviewed and discussed with patient certain preventive protocols, quality metrics, and best practice recommendations. A written personalized care plan for preventive services as well as general preventive health recommendations were provided to patient.     Mady Haagensen [place], [place]   06/26/2016

## 2016-06-29 ENCOUNTER — Encounter: Payer: Self-pay | Admitting: Family Medicine

## 2016-06-29 ENCOUNTER — Ambulatory Visit (INDEPENDENT_AMBULATORY_CARE_PROVIDER_SITE_OTHER): Payer: Medicare Other | Admitting: Family Medicine

## 2016-06-29 ENCOUNTER — Ambulatory Visit: Payer: Medicare Other | Admitting: Family Medicine

## 2016-06-29 ENCOUNTER — Telehealth: Payer: Self-pay | Admitting: Family Medicine

## 2016-06-29 VITALS — BP 138/98 | HR 92 | Temp 97.6°F | Ht 70.0 in | Wt 185.6 lb

## 2016-06-29 DIAGNOSIS — K219 Gastro-esophageal reflux disease without esophagitis: Secondary | ICD-10-CM | POA: Diagnosis not present

## 2016-06-29 DIAGNOSIS — K5900 Constipation, unspecified: Secondary | ICD-10-CM

## 2016-06-29 DIAGNOSIS — I10 Essential (primary) hypertension: Secondary | ICD-10-CM

## 2016-06-29 MED ORDER — CHLORTHALIDONE 25 MG PO TABS: 25.0000 mg | ORAL_TABLET | Freq: Every day | ORAL | 2 refills | Status: DC

## 2016-06-29 MED ORDER — OMEPRAZOLE 20 MG PO CPDR: 20.0000 mg | DELAYED_RELEASE_CAPSULE | Freq: Two times a day (BID) | ORAL | 1 refills | Status: DC

## 2016-06-29 MED ORDER — SENNOSIDES-DOCUSATE SODIUM 8.6-50 MG PO TABS: 1.0000 | ORAL_TABLET | Freq: Two times a day (BID) | ORAL | 1 refills | Status: DC

## 2016-06-29 MED ORDER — LOSARTAN POTASSIUM 100 MG PO TABS: 100.0000 mg | ORAL_TABLET | Freq: Every day | ORAL | 3 refills | Status: DC

## 2016-06-29 MED ORDER — AMLODIPINE BESYLATE 5 MG PO TABS: 5.0000 mg | ORAL_TABLET | Freq: Every day | ORAL | 3 refills | Status: DC

## 2016-06-29 NOTE — Patient Instructions (Addendum)
Andrew Cuevas  06/29/2016   Your procedure is scheduled on: 07-02-16  Report to [facility] Main  Entrance Take [place]  elevators to 3rd floor to  Short Stay Center at [phone].   Call this number if you have problems the morning of surgery [phone]    Remember: ONLY 1 PERSON MAY GO WITH YOU TO SHORT STAY TO GET  READY MORNING OF YOUR SURGERY.  Do not eat food or drink liquids :After Midnight.     Take these medicines the morning of surgery with A SIP OF WATER: AMLODIPINE(NORVASC), CLONIDINE(CATAPRES), PANTAPRAZOLE(PROTONIX)                                You may not have any metal on your body including hair pins and              piercings  Do not wear jewelry, make-up, lotions, powders or perfumes, deodorant              Men may shave face and neck.   Do not bring valuables to the hospital. [facility] IS NOT             RESPONSIBLE   FOR VALUABLES.  Contacts, dentures or bridgework may not be worn into surgery.      Patients discharged the day of surgery will not be allowed to drive home.  Name and phone number of your driver:  Special Instructions: N/A              Please read over the following fact sheets you were given: _____________________________________________________________________             [facility] - Preparing for Surgery Before surgery, you can play an important role.  Because skin is not sterile, your skin needs to be as free of germs as possible.  You can reduce the number of germs on your skin by washing with CHG (chlorahexidine gluconate) soap before surgery.  CHG is an antiseptic cleaner which kills germs and bonds with the skin to continue killing germs even after washing. Please DO NOT use if you have an allergy to CHG or antibacterial soaps.  If your skin becomes reddened/irritated stop using the CHG and inform your nurse when you arrive at Short Stay. Do not shave (including legs and underarms) for at least 48 hours prior to the  first CHG shower.  You may shave your face/neck. Please follow these instructions carefully:  1.  Shower with CHG Soap the night before surgery and the  morning of Surgery.  2.  If you choose to wash your hair, wash your hair first as usual with your  normal  shampoo.  3.  After you shampoo, rinse your hair and body thoroughly to remove the  shampoo.                           4.  Use CHG as you would any other liquid soap.  You can apply chg directly  to the skin and wash                       Gently with a scrungie or clean washcloth.  5.  Apply the CHG Soap to your body ONLY FROM THE NECK DOWN.   Do not use on  face/ open                           Wound or open sores. Avoid contact with eyes, ears mouth and genitals (private parts).                       Wash face,  Genitals (private parts) with your normal soap.             6.  Wash thoroughly, paying special attention to the area where your surgery  will be performed.  7.  Thoroughly rinse your body with warm water from the neck down.  8.  DO NOT shower/wash with your normal soap after using and rinsing off  the CHG Soap.                9.  Pat yourself dry with a clean towel.            10.  Wear clean pajamas.            11.  Place clean sheets on your bed the night of your first shower and do not  sleep with pets. Day of Surgery : Do not apply any lotions/deodorants the morning of surgery.  Please wear clean clothes to the hospital/surgery center.  FAILURE TO FOLLOW THESE INSTRUCTIONS MAY RESULT IN THE CANCELLATION OF YOUR SURGERY PATIENT SIGNATURE_________________________________  NURSE SIGNATURE__________________________________  ________________________________________________________________________

## 2016-06-29 NOTE — Progress Notes (Signed)
Chief Complaint  Patient presents with  . Follow-up    on BP    Subjective Andrew Cuevas is a 71 y.o. male who presents for hypertension follow up. He does not monitor home blood pressures. He is compliant with medications- Exforge 5-320 mg daily, clonidine 0.1 mg BID. Patient has these side effects of medication: none He is adhering to a healthy diet overall. Current exercise: leg exercises, walking  He is on Prozac, unsure why. His mood is OK according to patient, his brother feels similarly. OK to stop.    Past Medical History:  Diagnosis Date  . Chronic headaches   . CVA (cerebral vascular accident) (HCC)   . HTN (hypertension), malignant    Family History  Problem Relation Age of Onset  . Hypertension Mother   . Arthritis Mother   . Alzheimer's disease Father   . Stroke Sister      Medications Current Outpatient Prescriptions on File Prior to Visit  Medication Sig Dispense Refill  . amLODipine-valsartan (EXFORGE) 5-320 MG tablet TAKE 1 TABLET BY MOUTH EVERY DAY 30 tablet 1  . cloNIDine (CATAPRES) 0.1 MG tablet Take 1 tablet (0.1 mg total) by mouth 2 (two) times daily. 60 tablet 0  . FLUoxetine (PROZAC) 10 MG capsule Take 1 capsule (10 mg total) by mouth daily. 30 capsule 0  . senna-docusate (SENOKOT-S) 8.6-50 MG tablet Take 1 tablet by mouth 2 (two) times daily. 60 tablet 0   Allergies No Known Allergies  Review of Systems Cardiovascular: no chest pain Respiratory:  no shortness of breath  Exam BP (!) 138/98 (BP Location: Left Arm, Patient Position: Sitting, Cuff Size: Normal)   Pulse 92   Temp 97.6 F (36.4 C) (Oral)   Ht  (1.778 m)   Wt 185 lb 9.6 oz (84.2 kg)   SpO2 92%   BMI 26.63 kg/m  General:  well developed, well nourished, in no apparent distress Skin:  warm, no pallor or diaphoresis Eyes:  pupils equal and round, sclera anicteric without injection Heart: RRR, no murmurs, no bruits, no LE edema Lungs:  clear to auscultation, no accessory  muscle use Psych: well oriented with normal range of affect and appropriate judgment/insight  Essential hypertension - Plan: amLODipine (NORVASC) 5 MG tablet, losartan (COZAAR) 100 MG tablet, chlorthalidone (HYGROTON) 25 MG tablet, Basic metabolic panel, Basic metabolic panel  Gastroesophageal reflux disease, esophagitis presence not specified - Plan: omeprazole (PRILOSEC) 20 MG capsule  Constipation, unspecified constipation type - Plan: senna-docusate (SENOKOT-S) 8.6-50 MG tablet  Orders as above. Stop Exforge due to price, will start Norvasc and Losartan. Add Chlorthalidone. Labs today and in 1 week. Counseled on diet and exercise  OK to stop low dose of fluoxetine. F/u in 1 mo to check BP- counseled on checking BP at home. The patient voiced understanding and agreement to the plan.  Jilda Roche Ainsworth, DO 06/29/16  3:47 PM

## 2016-06-29 NOTE — Progress Notes (Signed)
[facility]OV CARDIOLOGY DR [facility] 03-27-16 EPIC EKG 06-16-16 EPIC ECHO 04-03-16 EPIC CXR 04-14-16 EPIC UA 06-16-16 EPIC CBC 06-16-16 EPIC

## 2016-06-29 NOTE — Telephone Encounter (Signed)
No charge. 

## 2016-06-29 NOTE — Patient Instructions (Addendum)
Finish the Exforge [employer]) until you run out. After that, start taking amlodipine 5 mg daily and losartan 100 mg daily.  Stop Prozac (fluoxetine) for now.

## 2016-06-29 NOTE — Telephone Encounter (Signed)
Transportation cancelled patient 10am appointment for today and [facility] to 2:30pm today, charge or no charge

## 2016-06-29 NOTE — Progress Notes (Signed)
Pre visit review using our clinic review tool, if applicable. No additional management support is needed unless otherwise documented below in the visit note. 

## 2016-06-30 LAB — BASIC METABOLIC PANEL
BUN: 12 mg/dL (ref 6–23)
CALCIUM: 10 mg/dL (ref 8.4–10.5)
CO2: 22 mEq/L (ref 19–32)
CREATININE: 1.26 mg/dL (ref 0.40–1.50)
Chloride: 103 mEq/L (ref 96–112)
GFR: 72.16 mL/min (ref >60.00)
GLUCOSE: 74 mg/dL (ref 70–99)
Potassium: 3.8 mEq/L (ref 3.5–5.1)
SODIUM: 138 meq/L (ref 135–145)

## 2016-07-01 ENCOUNTER — Encounter (HOSPITAL_COMMUNITY)
Admission: RE | Admit: 2016-07-01 | Discharge: 2016-07-01 | Disposition: A | Discharge status: Home / Self Care | Payer: Medicare Other | Source: Ambulatory Visit | Attending: Urology | Admitting: Urology

## 2016-07-01 ENCOUNTER — Other Ambulatory Visit (HOSPITAL_COMMUNITY): Payer: Self-pay | Admitting: Emergency Medicine

## 2016-07-01 DIAGNOSIS — Z8673 Personal history of transient ischemic attack (TIA), and cerebral infarction without residual deficits: Secondary | ICD-10-CM | POA: Diagnosis not present

## 2016-07-01 DIAGNOSIS — Z87891 Personal history of nicotine dependence: Secondary | ICD-10-CM | POA: Diagnosis not present

## 2016-07-01 DIAGNOSIS — I10 Essential (primary) hypertension: Secondary | ICD-10-CM | POA: Diagnosis not present

## 2016-07-01 DIAGNOSIS — Z79899 Other long term (current) drug therapy: Secondary | ICD-10-CM | POA: Diagnosis not present

## 2016-07-01 DIAGNOSIS — N430 Encysted hydrocele: Secondary | ICD-10-CM | POA: Diagnosis not present

## 2016-07-01 NOTE — Progress Notes (Signed)
PATIENT AT PRE-OP PRESENTED IN WHEELCHAIR BUT ABLE TO STAND AT SCALE FOR WEIGHT. BP TODAY 143/91. DENIES CHEST PAIN, HEADACHE; DOES ENDORSE SOME SOB WITH EXERTION. PATIENT HX OF ILLICIT DRUG USE POSITIVE IN February AT TIME OF STROKE, HOWEVER STATES NO USE SINCE ADMISSION TO HOSPITAL.   CONSULTED WITH DR ROSE ANESTHESIA CONCERNING PT RECENT HX OF HEMORRHAGIC STROKE , ELEVATED BLOOD PRESSURES, AND ILLICIT DRUG USE. MADE AWARE THAT PATIENT HAS NO CLEARANCES AND HAS  ONLY F/U WITH NEUROLOGY  AND PCP SINCE D/C FROM HOSPITAL; NO CARDIO F/U. RECOMMENDATION FROM ROSE IS TO DO A URINE DRUG SCREEN DAY OF SURGERY AND IF BLOOD PRESSURE IS ABNORMALLY HIGH, POSSIBLE CANCELLATION. NOTE INDICATING THIS PLACED ON FRONT SHEET AND ORDER PLACED IN EPIC .

## 2016-07-01 NOTE — H&P (Signed)
CC: I have swelling in my scrotum.  HPI: Andrew Cuevas is a 71 year-old male patient who was referred by Dr. Arva Chafe, MD who is here for scrotal swelling.  He first noticed his hydrocele 6 months ago. His hydrocele is on both sides. He does have pain on the side of his hydrocele. His hydrocele does not cause restriction of normal activities.   He has not had injuries to the testicles or scrotum. He has had the following surgeries: Hernia Repair. The patient denies having the following scrotal surgeries: Vasectomy, Hydrocele Repair, Undescended Testis Surgery, and Varicocele Repair. He has not had a testicular infection.   Andrew Cuevas is a 71 yo BM who is sent by Dr. Carmelia Roller for scrotal swelling. He has had swelling for several months. The left side is worse than the right. He has some pain. He has had bilateral inguinal hernia repairs. He has is voiding without complaints and has no associated signs or symptoms. He is not on anticoagulants or antiplatelet meds for his prior CVA.      ALLERGIES: None   MEDICATIONS: Amlodipine-Valsartan 5 mg-320 mg tablet  Clonidine Hcl 0.1 mg tablet  Fluoxetine Hcl 10 mg tablet  Pantoprazole Sodium  Senna     GU PSH: None   NON-GU PSH: Hernia Repair, Bilateral    GU PMH: None   NON-GU PMH: Depression Hypertension Stroke/TIA    FAMILY HISTORY: 1 son - Other   SOCIAL HISTORY: Marital Status: Widowed Current Smoking Status: Patient does not smoke anymore.   Tobacco Use Assessment Completed: Used Tobacco in last 30 days? Drinks 1 caffeinated drink per day.    REVIEW OF SYSTEMS:    GU Review Male:   Patient reports frequent urination, hard to postpone urination, and get up at night to urinate. Patient denies burning/ pain with urination, leakage of urine, stream starts and stops, trouble starting your stream, have to strain to urinate , erection problems, and penile pain.  Gastrointestinal (Upper):   Patient denies nausea, vomiting, and  indigestion/ heartburn.  Gastrointestinal (Lower):   Patient denies diarrhea and constipation.  Constitutional:   Patient denies fatigue, fever, night sweats, and weight loss.  Skin:   Patient denies skin rash/ lesion and itching.  Eyes:   Patient denies blurred vision and double vision.  Ears/ Nose/ Throat:   Patient denies sore throat and sinus problems.  Hematologic/Lymphatic:   Patient denies swollen glands and easy bruising.  Cardiovascular:   Patient denies leg swelling and chest pains.  Respiratory:   Patient denies cough and shortness of breath.  Endocrine:   Patient denies excessive thirst.  Musculoskeletal:   Patient denies back pain and joint pain.  Neurological:   He has mild aphasia issues and mild memory loss. Patient reports headaches and dizziness.   Psychologic:   Patient denies depression and anxiety.   VITAL SIGNS:      06/15/2016 11:41 AM  Height 69 in / 175.26 cm  BP 158/98 mmHg  Heart Rate 75 /min  Temperature 97.4 F / 36 C   GU PHYSICAL EXAMINATION:    Scrotum: No lesions. No edema. No cysts. No warts.  Epididymides: Right: no spermatocele, no masses, no cysts, no tenderness, no induration, no enlargement. Left: no spermatocele, no masses, no cysts, no tenderness, no induration, no enlargement.  Testes: 3-5 cm hydrocele left testis. No tenderness, no swelling, no enlargement left testis. No tenderness, no swelling, no enlargement right testis. Normal location left testis. Normal location right testis. No mass,  no cyst, no varicocele left testis. No mass, no cyst, no varicocele, no hydrocele right testis.   Urethral Meatus: Normal size. No lesion, no wart, no discharge, no polyp. Normal location.  Penis: Penis uncircumcised. No foreskin warts, no cracks. No dorsal peyronie's plaques, no left corporal peyronie's plaques, no right corporal peyronie's plaques, no scarring, no shaft warts. No balanitis, no meatal stenosis.    MULTI-SYSTEM PHYSICAL EXAMINATION:     Constitutional: Well-nourished. No physical deformities. Normally developed. Good grooming.  Neck: Neck symmetrical, not swollen. Normal tracheal position.  Respiratory: No labored breathing, no use of accessory muscles. CTA  Cardiovascular: Normal temperature,RRR without murmur.  Lymphatic: No enlargement, no tenderness of axillae, groin, neck lymph nodes.  Skin: No paleness, no jaundice, no cyanosis. No lesion, no ulcer, no rash.  Neurologic / Psychiatric: Oriented to time, oriented to place, oriented to person. No depression, no anxiety, no agitation. He has a mild aphasia and is weak.  Gastrointestinal: No hernia. No mass, no tenderness, no rigidity, non obese abdomen.   Musculoskeletal: Normal gait and station of head and neck.     PAST DATA REVIEWED:  Source Of History:  Patient  Records Review:   Previous Doctor Records  Urine Test Review:   Urinalysis  Notes:                     Records from Dr. Carmelia Roller reviewed.    PROCEDURES:          Urinalysis w/Scope Dipstick Dipstick Cont'd Micro  Color: Amber Bilirubin: Neg WBC/hpf: NS (Not Seen)  Appearance: Clear Ketones: Trace RBC/hpf: 0 - 2/hpf  Specific Gravity: 1.030 Blood: Neg Bacteria: NS (Not Seen)  pH: 6.0 Protein: 1+ Cystals: NS (Not Seen)  Glucose: Neg Urobilinogen: 1.0 Casts: NS (Not Seen)    Nitrites: Neg Trichomonas: Not Present    Leukocyte Esterase: Neg Mucous: Present      Epithelial Cells: 0 - 5/hpf      Yeast: NS (Not Seen)      Sperm: Not Present    ASSESSMENT:      ICD-10 Details  1 GU:   Encysted hydrocele - N43.0 Left, He has a moderate left hydrocele that is symptomatic. I will get him set up for a hydrocelectomy and reviewed the risks of bleeding, infection, injury to the testicle, recurrence, thrombotic events and anesthetic complications.    PLAN:           Schedule Return Visit/Planned Activity: Next Available Appointment             Note: left hydrocelectomy.          Document Letter(s):   Created for Patient: Clinical Summary

## 2016-07-01 NOTE — Anesthesia Preprocedure Evaluation (Addendum)
Anesthesia Evaluation  Patient identified by MRN, date of birth, ID band Patient awake    Reviewed: Allergy & Precautions, NPO status , Patient's Chart, lab work & pertinent test results  Airway Mallampati: II  TM Distance: >3 FB Neck ROM: Full    Dental no notable dental hx.    Pulmonary former smoker,    Pulmonary exam normal breath sounds clear to auscultation       Cardiovascular hypertension, Pt. on medications Normal cardiovascular exam Rhythm:Regular Rate:Normal     Neuro/Psych  Headaches, PSYCHIATRIC DISORDERS CVA    GI/Hepatic negative GI ROS, Neg liver ROS,   Endo/Other  negative endocrine ROS  Renal/GU Renal disease     Musculoskeletal negative musculoskeletal ROS (+)   Abdominal   Peds  Hematology  (+) Blood dyscrasia, anemia ,   Anesthesia Other Findings   Reproductive/Obstetrics negative OB ROS                             Anesthesia Physical Anesthesia Plan  ASA: III  Anesthesia Plan: General   Post-op Pain Management:    Induction: Intravenous  Airway Management Planned: LMA  Additional Equipment:   Intra-op Plan: Utilization Of Total Body Hypothermia per surgeon request  Post-operative Plan: Extubation in OR  Informed Consent: I have reviewed the patients History and Physical, chart, labs and discussed the procedure including the risks, benefits and alternatives for the proposed anesthesia with the patient or authorized representative who has indicated his/her understanding and acceptance.   Dental advisory given  Plan Discussed with: CRNA  Anesthesia Plan Comments:        Anesthesia Quick Evaluation

## 2016-07-01 NOTE — Progress Notes (Signed)
[facility] NEUR[facility] DR.SETHI 06-08-16 EPIC  BMP 06-29-16 EPIC

## 2016-07-02 ENCOUNTER — Encounter (HOSPITAL_COMMUNITY): Payer: Self-pay | Admitting: Certified Registered Nurse Anesthetist

## 2016-07-02 ENCOUNTER — Ambulatory Visit (HOSPITAL_COMMUNITY)
Admission: RE | Admit: 2016-07-02 | Discharge: 2016-07-02 | Disposition: A | Discharge status: Home / Self Care | Payer: Medicare Other | Source: Ambulatory Visit | Attending: Urology | Admitting: Urology

## 2016-07-02 ENCOUNTER — Ambulatory Visit (HOSPITAL_COMMUNITY): Payer: Medicare Other | Admitting: Anesthesiology

## 2016-07-02 ENCOUNTER — Encounter (HOSPITAL_COMMUNITY)
Admission: RE | Disposition: A | Discharge status: Home / Self Care | Payer: Self-pay | Source: Ambulatory Visit | Attending: Urology

## 2016-07-02 DIAGNOSIS — Z79899 Other long term (current) drug therapy: Secondary | ICD-10-CM | POA: Insufficient documentation

## 2016-07-02 DIAGNOSIS — N430 Encysted hydrocele: Secondary | ICD-10-CM | POA: Diagnosis not present

## 2016-07-02 DIAGNOSIS — Z87891 Personal history of nicotine dependence: Secondary | ICD-10-CM | POA: Insufficient documentation

## 2016-07-02 DIAGNOSIS — I10 Essential (primary) hypertension: Secondary | ICD-10-CM | POA: Insufficient documentation

## 2016-07-02 DIAGNOSIS — Z8673 Personal history of transient ischemic attack (TIA), and cerebral infarction without residual deficits: Secondary | ICD-10-CM | POA: Insufficient documentation

## 2016-07-02 HISTORY — PX: HYDROCELE EXCISION: SHX482

## 2016-07-02 LAB — RAPID URINE DRUG SCREEN, HOSP PERFORMED
Amphetamines: NOT DETECTED
BARBITURATES: NOT DETECTED
Benzodiazepines: NOT DETECTED
Cocaine: NOT DETECTED
Opiates: NOT DETECTED
TETRAHYDROCANNABINOL: NOT DETECTED

## 2016-07-02 SURGERY — HYDROCELECTOMY
Anesthesia: General | Laterality: Left

## 2016-07-02 MED ORDER — HYDROCODONE-ACETAMINOPHEN 5-325 MG PO TABS: 1.0000 | ORAL_TABLET | Freq: Four times a day (QID) | ORAL | 0 refills | Status: DC | PRN

## 2016-07-02 MED ORDER — FENTANYL CITRATE (PF) 100 MCG/2ML IJ SOLN
INTRAMUSCULAR | Status: AC
  Filled 2016-07-02: qty 2

## 2016-07-02 MED ORDER — LACTATED RINGERS IV SOLN
INTRAVENOUS | Status: DC
  Administered 2016-07-02: 08:00:00 via INTRAVENOUS

## 2016-07-02 MED ORDER — BUPIVACAINE HCL (PF) 0.25 % IJ SOLN
INTRAMUSCULAR | Status: DC | PRN
  Administered 2016-07-02: 10 mL

## 2016-07-02 MED ORDER — FENTANYL CITRATE (PF) 100 MCG/2ML IJ SOLN
INTRAMUSCULAR | Status: DC | PRN
  Administered 2016-07-02: 50 ug via INTRAVENOUS

## 2016-07-02 MED ORDER — HYDROMORPHONE HCL 1 MG/ML IJ SOLN: 0.2500 mg | INTRAMUSCULAR | Status: DC | PRN

## 2016-07-02 MED ORDER — LIDOCAINE 2% (20 MG/ML) 5 ML SYRINGE
INTRAMUSCULAR | Status: DC | PRN
  Administered 2016-07-02: 60 mg via INTRAVENOUS

## 2016-07-02 MED ORDER — OXYCODONE HCL 5 MG/5ML PO SOLN: 5.0000 mg | Freq: Once | ORAL | Status: DC | PRN

## 2016-07-02 MED ORDER — CEFAZOLIN SODIUM-DEXTROSE 2-4 GM/100ML-% IV SOLN
2.0000 g | INTRAVENOUS | Status: AC
  Administered 2016-07-02: 2 g via INTRAVENOUS
  Filled 2016-07-02: qty 100

## 2016-07-02 MED ORDER — PROPOFOL 10 MG/ML IV BOLUS
INTRAVENOUS | Status: DC | PRN
  Administered 2016-07-02: 130 mg via INTRAVENOUS

## 2016-07-02 MED ORDER — PROMETHAZINE HCL 25 MG/ML IJ SOLN: 6.2500 mg | INTRAMUSCULAR | Status: DC | PRN

## 2016-07-02 MED ORDER — MEPERIDINE HCL 50 MG/ML IJ SOLN: 6.2500 mg | INTRAMUSCULAR | Status: DC | PRN

## 2016-07-02 MED ORDER — OXYCODONE HCL 5 MG PO TABS: 5.0000 mg | ORAL_TABLET | Freq: Once | ORAL | Status: DC | PRN

## 2016-07-02 MED ORDER — BUPIVACAINE HCL (PF) 0.25 % IJ SOLN
INTRAMUSCULAR | Status: AC
  Filled 2016-07-02: qty 30

## 2016-07-02 MED ORDER — ONDANSETRON HCL 4 MG/2ML IJ SOLN
INTRAMUSCULAR | Status: DC | PRN
  Administered 2016-07-02: 4 mg via INTRAVENOUS

## 2016-07-02 MED ORDER — EPHEDRINE SULFATE-NACL 50-0.9 MG/10ML-% IV SOSY
PREFILLED_SYRINGE | INTRAVENOUS | Status: DC | PRN
  Administered 2016-07-02: 5 mg via INTRAVENOUS

## 2016-07-02 MED ORDER — PROPOFOL 10 MG/ML IV BOLUS
INTRAVENOUS | Status: AC
  Filled 2016-07-02: qty 20

## 2016-07-02 MED ORDER — PHENYLEPHRINE 40 MCG/ML (10ML) SYRINGE FOR IV PUSH (FOR BLOOD PRESSURE SUPPORT)
PREFILLED_SYRINGE | INTRAVENOUS | Status: DC | PRN
  Administered 2016-07-02: 80 ug via INTRAVENOUS

## 2016-07-02 SURGICAL SUPPLY — 28 items
BENZOIN TINCTURE PRP APPL 2/3 (GAUZE/BANDAGES/DRESSINGS) ×3 IMPLANT
BLADE HEX COATED 2.75 (ELECTRODE) ×3 IMPLANT
BLADE SURG 15 STRL LF DISP TIS (BLADE) ×1 IMPLANT
BLADE SURG 15 STRL SS (BLADE) ×2
BNDG GAUZE ELAST 4 BULKY (GAUZE/BANDAGES/DRESSINGS) ×3 IMPLANT
CATH URET 5FR 28IN OPEN ENDED (CATHETERS) IMPLANT
COVER SURGICAL LIGHT HANDLE (MISCELLANEOUS) ×3 IMPLANT
DRAIN PENROSE 18X1/2 LTX STRL (DRAIN) IMPLANT
DRAIN PENROSE 18X1/4 LTX STRL (WOUND CARE) ×3 IMPLANT
ELECT PENCIL ROCKER SW 15FT (MISCELLANEOUS) ×3 IMPLANT
ELECT REM PT RETURN 15FT ADLT (MISCELLANEOUS) ×3 IMPLANT
GAUZE SPONGE 4X4 12PLY STRL (GAUZE/BANDAGES/DRESSINGS) ×3 IMPLANT
GLOVE SURG SS PI 8.0 STRL IVOR (GLOVE) IMPLANT
GOWN STRL REUS W/TWL XL LVL3 (GOWN DISPOSABLE) ×3 IMPLANT
KIT BASIN OR (CUSTOM PROCEDURE TRAY) ×3 IMPLANT
NEEDLE HYPO 22GX1.5 SAFETY (NEEDLE) ×3 IMPLANT
NS IRRIG 1000ML POUR BTL (IV SOLUTION) IMPLANT
PACK BASIC VI WITH GOWN DISP (CUSTOM PROCEDURE TRAY) ×3 IMPLANT
SPONGE LAP 4X18 X RAY DECT (DISPOSABLE) ×3 IMPLANT
SUPPORT SCROTAL LG STRP (MISCELLANEOUS) IMPLANT
SUPPORTER ATHLETIC LG (MISCELLANEOUS)
SUT CHROMIC 3 0 SH 27 (SUTURE) ×6 IMPLANT
SUT CHROMIC 4 0 SH 27 (SUTURE) IMPLANT
SUT VIC AB 3-0 SH 27 (SUTURE) ×2
SUT VIC AB 3-0 SH 27XBRD (SUTURE) ×1 IMPLANT
SUT VICRYL 0 TIES 12 18 (SUTURE) ×3 IMPLANT
SYR CONTROL 10ML LL (SYRINGE) ×3 IMPLANT
WATER STERILE IRR 1500ML POUR (IV SOLUTION) IMPLANT

## 2016-07-02 NOTE — Brief Op Note (Signed)
07/02/2016  9:40 AM  PATIENT:  Andrew Cuevas  71 y.o. male  PRE-OPERATIVE DIAGNOSIS:  LEFT HYDROCELE  POST-OPERATIVE DIAGNOSIS:  LEFT HYDROCELE  PROCEDURE:  Procedure(s): HYDROCELECTOMY ADULT (Left)  SURGEON:  Surgeon(s) and Role:    * Bjorn PippinJohn Rotha Cassels, MD - Primary  PHYSICIAN ASSISTANT:   ASSISTANTS: none   ANESTHESIA:   general  EBL:  Total I/O In: 500 [I.V.:500] Out: -   BLOOD ADMINISTERED:none  DRAINS: Penrose drain in the left scrotum   LOCAL MEDICATIONS USED:  MARCAINE    and Amount: 10 ml 0.25%  SPECIMEN:  No Specimen  DISPOSITION OF SPECIMEN:  N/A  COUNTS:  YES  TOURNIQUET:  * No tourniquets in log *  DICTATION: .Other Dictation: Dictation Number [id number]  PLAN OF CARE: Discharge to home after PACU  PATIENT DISPOSITION:  PACU - hemodynamically stable.   Delay start of Pharmacological VTE agent (>24hrs) due to surgical blood loss or risk of bleeding: not applicable

## 2016-07-02 NOTE — Anesthesia Postprocedure Evaluation (Signed)
Anesthesia Post Note  Patient: Andrew Cuevas  Procedure(s) Performed: Procedure(s) (LRB): HYDROCELECTOMY ADULT (Left)  Patient location during evaluation: PACU Anesthesia Type: General Level of consciousness: sedated and patient cooperative Pain management: pain level controlled Vital Signs Assessment: post-procedure vital signs reviewed and stable Respiratory status: spontaneous breathing Cardiovascular status: stable Anesthetic complications: no       Last Vitals:  Vitals:   07/02/16 1042 07/02/16 1135  BP: 132/73 140/82  Pulse:  76  Resp: 14 18  Temp: 36.4 C 36.4 C    Last Pain:  Vitals:   07/02/16 1135  TempSrc: Oral  PainSc:                  Lewie LoronJohn Arlette Schaad

## 2016-07-02 NOTE — Anesthesia Procedure Notes (Signed)
Procedure Name: LMA Insertion Performed by: Kizzie FantasiaARVER, Carollynn Pennywell J Pre-anesthesia Checklist: Patient identified, Emergency Drugs available, Suction available and Patient being monitored Patient Re-evaluated:Patient Re-evaluated prior to inductionOxygen Delivery Method: Circle system utilized Preoxygenation: Pre-oxygenation with 100% oxygen Intubation Type: IV induction Ventilation: Mask ventilation without difficulty LMA: LMA inserted Laryngoscope Size: 4 Number of attempts: 1 Placement Confirmation: positive ETCO2,  CO2 detector and breath sounds checked- equal and bilateral Tube secured with: Tape Dental Injury: Teeth and Oropharynx as per pre-operative assessment

## 2016-07-02 NOTE — Transfer of Care (Signed)
Immediate Anesthesia Transfer of Care Note  Patient: Andrew Cuevas  Procedure(s) Performed: Procedure(s): HYDROCELECTOMY ADULT (Left)  Patient Location: PACU  Anesthesia Type:General  Level of Consciousness: sedated, patient cooperative and responds to stimulation  Airway & Oxygen Therapy: Patient Spontanous Breathing and Patient connected to face mask oxygen  Post-op Assessment: Report given to RN and Post -op Vital signs reviewed and stable  Post vital signs: Reviewed and stable  Last Vitals:  Vitals:   07/02/16 0738  BP: (!) 161/93  Pulse: 85  Resp: 18  Temp: 37.1 C    Last Pain:  Vitals:   07/02/16 0829  TempSrc:   PainSc: 0-No pain      Patients Stated Pain Goal: 3 (07/02/16 0829)  Complications: No apparent anesthesia complications

## 2016-07-02 NOTE — Discharge Instructions (Signed)
Hydrocelectomy, Adult, Care After This sheet gives you information about how to care for yourself after your procedure. Your health care provider may also give you more specific instructions. If you have problems or questions, contact your health care provider. What can I expect after the procedure? After your procedure, it is common to have mild discomfort, swelling, and bruising in the pouch that holds your testicles (scrotum). Follow these instructions at home: Bathing   Ask your health care provider when you can shower, take baths, or go swimming.  If you were told to wear an athletic support strap, take it off when you shower or take a bath. Incision care   Follow instructions from your health care provider about how to take care of your incision. Make sure you:  Wash your hands with soap and water before you change your bandage (dressing). If soap and water are not available, use hand sanitizer.  Change your dressing as told by your health care provider.  Leave stitches (sutures) in place.  Check your incision and scrotum every day for signs of infection. Check for:  More redness, swelling, or pain.  Blood or fluid.  Warmth.  Pus or a bad smell. Managing pain, stiffness, and swelling   If directed, apply ice to the injured area:  Put ice in a plastic bag.  Place a towel between your skin and the bag.  Leave the ice on for 20 minutes, 2-3 times per day. Driving   Do not drive for 24 hours if you were given a sedative.  Do not drive or use heavy machinery while taking prescription pain medicine.  Ask your health care provider when it is safe to drive. Activity   Do not do any activities that require great strength and energy (are vigorous) for as long as told by your health care provider.  Return to your normal activities as told by your health care provider. Ask your health care provider what activities are safe for you.  Do not lift anything that is heavier  than 10 lb (4.5 kg) until your health care provider says that it is safe. General instructions   Take over-the-counter and prescription medicines only as told by your health care provider.  Keep all follow-up visits as told by your health care provider. This is important.  If you were given an athletic support strap, wear it as told by your health care provider.  If you had a drain put in during the procedure, you will need to return for a follow-up visit to have it removed. Contact a health care provider if:  Your pain is not controlled with medicine.  You have more redness or swelling around your scrotum.  You have blood or fluid coming from your scrotum.  Your incision feels warm to the touch.  You have pus or a bad smell coming from your scrotum.  You have a fever.  There is a small drain below the incision.  You may remove that in the morning.  If it doesn't come out easily or you don't feel comfortable doing that, you may come to the office to have that done.     This information is not intended to replace advice given to you by your health care provider. Make sure you discuss any questions you have with your health care provider. Document Released: 11/07/2014 Document Revised: 11/16/2015 Document Reviewed: 11/16/2015 Elsevier Interactive Patient Education  2017 [employer]

## 2016-07-02 NOTE — Interval H&P Note (Signed)
History and Physical Interval Note:  07/02/2016 8:38 AM  Andrew Cuevas  has presented today for surgery, with the diagnosis of LEFT HYDROCELE  The various methods of treatment have been discussed with the patient and family. After consideration of risks, benefits and other options for treatment, the patient has consented to  Procedure(s): HYDROCELECTOMY ADULT (Left) as a surgical intervention .  The patient's history has been reviewed, patient examined, no change in status, stable for surgery.  I have reviewed the patient's chart and labs.  Questions were answered to the patient's satisfaction.     [name],Augusto J

## 2016-07-03 ENCOUNTER — Encounter (HOSPITAL_COMMUNITY): Payer: Self-pay | Admitting: Urology

## 2016-07-03 NOTE — Op Note (Signed)
NAMDeveron Cuevas:  Krauser, Jabe                 ACCOUNT NO.:  [id number]  MEDICAL RECORD NO.:  [id number]  LOCATION:                                 FACILITY:  PHYSICIAN:  Excell SeltzerJohn J. Annabell HowellsWrenn, M.D.    DATE OF BIRTH:  01/24/1946  DATE OF PROCEDURE:  07/02/2016 DATE OF DISCHARGE:  07/02/2016                              OPERATIVE REPORT   PROCEDURE:  Left hydrocelectomy.  PREOPERATIVE DIAGNOSIS:  Left hydrocele.  POSTOPERATIVE DIAGNOSIS:  Left hydrocele.  SURGEON:  Excell SeltzerJohn J. Annabell HowellsWrenn, M.D.  ANESTHESIA:  General.  SPECIMEN:  None.  DRAINS:  Quarter-inch Penrose drain.  BLOOD LOSS:  Minimal.  COMPLICATIONS:  None.  INDICATIONS:  [name] is a 71 year old African American male with a symptomatic left hydrocele, who has elected hydrocelectomy.  FINDINGS OF PROCEDURE:  He was taken to the operating room where he was given 2 g of Ancef.  A general anesthetic was induced.  The patient in the supine position.  He was fitted with PAS hose.  His scrotum was clipped.  He was prepped with Betadine solution.  He was draped in usual sterile fashion.  A left scrotal anterior oblique incision was made with a knife.  This was carried down through the dartos with the Bovie.  The testicle within the hydrocele sac was delivered from the wound.  The sac was opened and drained.  Approximately 150 mL of fluid was obtained.  The excess sac was excised and the redundant sac was then imbricated behind the testicle in a water bottle fashion with a running locked 3-0 chromic suture.  The cord block was performed with 4 mL of 0.25% Marcaine.  The testicle was returned to the right hemiscrotum.  Once hemostasis was assured, a quarter-inch Penrose drain was placed through a separate stab wound in the dependent left scrotum.  It was held with a towel clip during closure.  The wound was then closed in 2 layers using a running 3-0 chromic on the dartos and then running vertical mattress 3-0 chromic on the  skin.  Once the wound was closed, an additional 6 mL of 0.25% Marcaine was infiltrated in the peri-incisional area.  The Penrose drain was then trimmed.  The wound was cleansed.  A dressing of 4x4s, fluff, Kerlix, and a scrotal support were applied.  The patient's anesthetic was reversed.  He was moved to the recovery room in stable condition.  There were no complications.     Excell SeltzerJohn J. Annabell HowellsWrenn, M.D.   ______________________________ Excell SeltzerJohn J. Annabell HowellsWrenn, M.D.    JJW/MEDQ  D:  07/02/2016  T:  07/02/2016  Job:  [id number]

## 2016-07-06 ENCOUNTER — Telehealth: Payer: Self-pay | Admitting: Family Medicine

## 2016-07-06 NOTE — Telephone Encounter (Signed)
Called and spoke with the pt's sister([name]) and went over each of the pt's medications and explained to her what each medication is used for.  Explained to her the medication changes that was made at the pt's last appt.  Also verified the pt's upcoming appointments.//AB/CMA

## 2016-07-06 NOTE — Telephone Encounter (Signed)
Caller name: Alona BeneJoyce  Relation to pt: Sister Call back number: [phone] Pharmacy:  Reason for call: Pt's sister was calling with some concerns about what medication for Blood pressure does the pt have to take since pt has a few different medication and she would like to know for sure if he has to take them all or not Alona Bene[name] Please advise.

## 2016-07-09 ENCOUNTER — Other Ambulatory Visit (INDEPENDENT_AMBULATORY_CARE_PROVIDER_SITE_OTHER): Payer: Medicare Other

## 2016-07-09 DIAGNOSIS — I10 Essential (primary) hypertension: Secondary | ICD-10-CM | POA: Diagnosis not present

## 2016-07-09 LAB — BASIC METABOLIC PANEL
BUN: 44 mg/dL — AB (ref 6–23)
CHLORIDE: 103 meq/L (ref 96–112)
CO2: 22 mEq/L (ref 19–32)
Calcium: 10 mg/dL (ref 8.4–10.5)
Creatinine, Ser: 2.91 mg/dL — ABNORMAL HIGH (ref 0.40–1.50)
GFR: 27.46 mL/min — AB (ref >60.00)
Glucose, Bld: 103 mg/dL — ABNORMAL HIGH (ref 70–99)
POTASSIUM: 3.6 meq/L (ref 3.5–5.1)
SODIUM: 139 meq/L (ref 135–145)

## 2016-07-10 ENCOUNTER — Telehealth: Payer: Self-pay | Admitting: *Deleted

## 2016-07-10 DIAGNOSIS — N289 Disorder of kidney and ureter, unspecified: Secondary | ICD-10-CM

## 2016-07-10 NOTE — Telephone Encounter (Signed)
Called and spoke with the pt's sister Alona Bene([name]) and informed of the pt's recent lab results and note.  She verbalized understanding and agreed.  Pt was scheduled a lab appt for (Fri-07/17/16 @ 1:15pm).  Future lab ordered and sent.  Pt already has an appt scheduled for follow-up visit on (08/06/16) with Dr. Wendling.//AB/CMA

## 2016-07-10 NOTE — Telephone Encounter (Signed)
-----   Message from Sharlene DoryNicholas Paul Wendling, DO sent at 07/09/2016  4:58 PM EDT ----- Let pt know to stop chlorthalidone. Drink lots of water. Recheck BMP in 1 week. If WNL, will add metoprolol succinate 50 mg daily. F/u 2-3 weeks after this addition for BP check or check on renal function. TY.

## 2016-07-14 ENCOUNTER — Encounter (HOSPITAL_COMMUNITY): Payer: Self-pay

## 2016-07-14 ENCOUNTER — Emergency Department (HOSPITAL_COMMUNITY): Payer: Medicare Other

## 2016-07-14 ENCOUNTER — Inpatient Hospital Stay (HOSPITAL_COMMUNITY)
Admission: EM | Admit: 2016-07-14 | Discharge: 2016-07-17 | DRG: 682 | Disposition: A | Discharge status: Home / Self Care | Payer: Medicare Other | Attending: Internal Medicine | Admitting: Internal Medicine

## 2016-07-14 DIAGNOSIS — I129 Hypertensive chronic kidney disease with stage 1 through stage 4 chronic kidney disease, or unspecified chronic kidney disease: Secondary | ICD-10-CM | POA: Diagnosis present

## 2016-07-14 DIAGNOSIS — E860 Dehydration: Secondary | ICD-10-CM | POA: Diagnosis present

## 2016-07-14 DIAGNOSIS — E43 Unspecified severe protein-calorie malnutrition: Secondary | ICD-10-CM | POA: Diagnosis present

## 2016-07-14 DIAGNOSIS — F1421 Cocaine dependence, in remission: Secondary | ICD-10-CM | POA: Diagnosis present

## 2016-07-14 DIAGNOSIS — R627 Adult failure to thrive: Secondary | ICD-10-CM | POA: Diagnosis present

## 2016-07-14 DIAGNOSIS — Z823 Family history of stroke: Secondary | ICD-10-CM

## 2016-07-14 DIAGNOSIS — N433 Hydrocele, unspecified: Secondary | ICD-10-CM | POA: Diagnosis present

## 2016-07-14 DIAGNOSIS — Z87891 Personal history of nicotine dependence: Secondary | ICD-10-CM | POA: Diagnosis not present

## 2016-07-14 DIAGNOSIS — I10 Essential (primary) hypertension: Secondary | ICD-10-CM | POA: Diagnosis present

## 2016-07-14 DIAGNOSIS — N183 Chronic kidney disease, stage 3 unspecified: Secondary | ICD-10-CM | POA: Diagnosis present

## 2016-07-14 DIAGNOSIS — Z6825 Body mass index (BMI) 25.0-25.9, adult: Secondary | ICD-10-CM

## 2016-07-14 DIAGNOSIS — R4182 Altered mental status, unspecified: Secondary | ICD-10-CM | POA: Diagnosis present

## 2016-07-14 DIAGNOSIS — N179 Acute kidney failure, unspecified: Secondary | ICD-10-CM | POA: Diagnosis present

## 2016-07-14 DIAGNOSIS — Z79899 Other long term (current) drug therapy: Secondary | ICD-10-CM | POA: Diagnosis not present

## 2016-07-14 DIAGNOSIS — Z8673 Personal history of transient ischemic attack (TIA), and cerebral infarction without residual deficits: Secondary | ICD-10-CM | POA: Diagnosis not present

## 2016-07-14 DIAGNOSIS — R7989 Other specified abnormal findings of blood chemistry: Secondary | ICD-10-CM | POA: Diagnosis present

## 2016-07-14 DIAGNOSIS — F329 Major depressive disorder, single episode, unspecified: Secondary | ICD-10-CM | POA: Diagnosis present

## 2016-07-14 DIAGNOSIS — D72829 Elevated white blood cell count, unspecified: Secondary | ICD-10-CM | POA: Diagnosis present

## 2016-07-14 LAB — CBC WITH DIFFERENTIAL/PLATELET
BASOS ABS: 0 10*3/uL (ref 0.0–0.1)
Basophils Relative: 0 %
Eosinophils Absolute: 0 10*3/uL (ref 0.0–0.7)
Eosinophils Relative: 0 %
HEMATOCRIT: 43.2 % (ref 39.0–52.0)
HEMOGLOBIN: 14.2 g/dL (ref 13.0–17.0)
LYMPHS PCT: 12 %
Lymphs Abs: 1.7 10*3/uL (ref 0.7–4.0)
MCH: 26.7 pg (ref 26.0–34.0)
MCHC: 32.9 g/dL (ref 30.0–36.0)
MCV: 81.2 fL (ref 78.0–100.0)
MONO ABS: 0.8 10*3/uL (ref 0.1–1.0)
Monocytes Relative: 6 %
NEUTROS ABS: 10.8 10*3/uL — AB (ref 1.7–7.7)
Neutrophils Relative %: 82 %
Platelets: 316 10*3/uL (ref 150–400)
RBC: 5.32 MIL/uL (ref 4.22–5.81)
RDW: 15.2 % (ref 11.5–15.5)
WBC: 13.3 10*3/uL — ABNORMAL HIGH (ref 4.0–10.5)

## 2016-07-14 LAB — URINALYSIS, ROUTINE W REFLEX MICROSCOPIC
Bilirubin Urine: NEGATIVE
Glucose, UA: NEGATIVE mg/dL
Hgb urine dipstick: NEGATIVE
Ketones, ur: NEGATIVE mg/dL
Leukocytes, UA: NEGATIVE
Nitrite: NEGATIVE
PH: 5 (ref 5.0–8.0)
Protein, ur: 30 mg/dL — AB
SPECIFIC GRAVITY, URINE: 1.017 (ref 1.005–1.030)
SQUAMOUS EPITHELIAL / LPF: NONE SEEN

## 2016-07-14 LAB — TROPONIN I: TROPONIN I: 0.04 ng/mL — AB (ref <0.03)

## 2016-07-14 LAB — LIPASE, BLOOD: LIPASE: 18 U/L (ref 11–51)

## 2016-07-14 LAB — COMPREHENSIVE METABOLIC PANEL
ALK PHOS: 71 U/L (ref 38–126)
ALT: 17 U/L (ref 17–63)
AST: 24 U/L (ref 15–41)
Albumin: 4.1 g/dL (ref 3.5–5.0)
Anion gap: 12 (ref 5–15)
BUN: 64 mg/dL — AB (ref 6–20)
CALCIUM: 10 mg/dL (ref 8.9–10.3)
CO2: 21 mmol/L — ABNORMAL LOW (ref 22–32)
CREATININE: 4.74 mg/dL — AB (ref 0.61–1.24)
Chloride: 108 mmol/L (ref 101–111)
GFR calc Af Amer: 13 mL/min — ABNORMAL LOW (ref >60)
GFR, EST NON AFRICAN AMERICAN: 11 mL/min — AB (ref >60)
Glucose, Bld: 126 mg/dL — ABNORMAL HIGH (ref 65–99)
POTASSIUM: 4.2 mmol/L (ref 3.5–5.1)
Sodium: 141 mmol/L (ref 135–145)
TOTAL PROTEIN: 9.7 g/dL — AB (ref 6.5–8.1)
Total Bilirubin: 0.9 mg/dL (ref 0.3–1.2)

## 2016-07-14 LAB — I-STAT CG4 LACTIC ACID, ED
LACTIC ACID, VENOUS: 1.63 mmol/L (ref 0.5–1.9)
LACTIC ACID, VENOUS: 1.42 mmol/L (ref 0.5–1.9)

## 2016-07-14 MED ORDER — SODIUM CHLORIDE 0.9 % IV BOLUS (SEPSIS)
1000.0000 mL | Freq: Once | INTRAVENOUS | Status: AC
  Administered 2016-07-14: 1000 mL via INTRAVENOUS

## 2016-07-14 MED ORDER — PIPERACILLIN-TAZOBACTAM 3.375 G IVPB 30 MIN
3.3750 g | Freq: Once | INTRAVENOUS | Status: AC
  Administered 2016-07-14: 3.375 g via INTRAVENOUS
  Filled 2016-07-14: qty 50

## 2016-07-14 MED ORDER — VANCOMYCIN HCL IN DEXTROSE 1-5 GM/200ML-% IV SOLN
1000.0000 mg | Freq: Once | INTRAVENOUS | Status: AC
  Administered 2016-07-14: 1000 mg via INTRAVENOUS
  Filled 2016-07-14: qty 200

## 2016-07-14 NOTE — ED Notes (Signed)
Bed: WA09 Expected date:  Expected time:  Means of arrival:  Comments: Hold for triage 3

## 2016-07-14 NOTE — ED Provider Notes (Signed)
[facility]-EMERGENCY DEPT Provider Note   CSN: [id number] Arrival date & time: 07/14/16  1442     History   Chief Complaint Chief Complaint  Patient presents with  . not eating  . Weakness  . Abdominal Pain    HPI Giuseppe Duchemin is a 71 y.o. male.  The history is provided by the patient and medical records.  Weakness  Primary symptoms include no focal weakness. This is a new problem. There was no focality noted. There has been no fever. Associated symptoms include altered mental status and confusion.    Past Medical History:  Diagnosis Date  . Chronic headaches   . CVA (cerebral vascular accident) (HCC)   . HTN (hypertension), malignant     Patient Active Problem List   Diagnosis Date Noted  . Acute renal failure (ARF) (HCC) 07/14/2016  . Reactive depression   . Infiltrate noted on imaging study   . Cough   . Abnormal chest x-ray   . Stage 3 chronic kidney disease   . Nontraumatic acute hemorrhage of basal ganglia (HCC) 04/08/2016  . Vitamin B 12 deficiency   . Cocaine abuse   . Right hemiparesis (HCC)   . Hemorrhagic stroke (HCC)   . Left hydrocele   . Tobacco abuse   . Diastolic dysfunction   . Stage 2 chronic kidney disease   . Benign essential HTN   . Chronic nonintractable headache   . History of CVA with residual deficit   . Acute blood loss anemia   . Intractable hiccups   . ICH (intracerebral hemorrhage) (HCC)   . Stroke (cerebrum) (HCC) 04/02/2016  . Hypertensive heart disease without heart failure 03/28/2016    Past Surgical History:  Procedure Laterality Date  . HERNIA REPAIR Bilateral   . HYDROCELE EXCISION Left 07/02/2016   Procedure: HYDROCELECTOMY ADULT;  Surgeon: Bjorn Pippin, MD;  Location: [facility];  Service: Urology;  Laterality: Left;       Home Medications    Prior to Admission medications   Medication Sig Start Date End Date Taking? Authorizing Provider  amLODipine (NORVASC) 5 MG tablet Take 1 tablet (5 mg total) by mouth daily. 06/29/16   Yes Sharlene Dory, DO  cloNIDine (CATAPRES) 0.1 MG tablet Take 1 tablet (0.1 mg total) by mouth 2 (two) times daily. 04/17/16  Yes Love, Evlyn Kanner, PA-C  HYDROcodone-acetaminophen (NORCO) 5-325 MG tablet Take 1 tablet by mouth every 6 (six) hours as needed for moderate pain. 07/02/16  Yes Bjorn Pippin, MD  losartan (COZAAR) 100 MG tablet Take 1 tablet (100 mg total) by mouth daily. 06/29/16  Yes Sharlene Dory, DO  omeprazole (PRILOSEC) 20 MG capsule Take 1 capsule (20 mg total) by mouth 2 (two) times daily before a meal. 06/29/16  Yes Wendling, Jilda Roche, DO  senna-docusate (SENOKOT-S) 8.6-50 MG tablet Take 1 tablet by mouth 2 (two) times daily. 06/29/16  Yes Sharlene Dory, DO  chlorthalidone (HYGROTON) 25 MG tablet Take 1 tablet (25 mg total) by mouth daily. Patient not taking: Reported on 07/14/2016 06/29/16   Sharlene Dory, DO    Family History Family History  Problem Relation Age of Onset  . Hypertension Mother   . Arthritis Mother   . Alzheimer's disease Father   . Stroke Sister     Social History Social History  Substance Use Topics  . Smoking status: Former Smoker    Packs/day: 0.50    Types: Cigarettes    Quit date: 04/27/2016  . Smokeless tobacco: Never  Used  . Alcohol use No     Allergies   Patient has no known allergies.   Review of Systems Review of Systems  Unable to perform ROS: Mental status change  Neurological: Positive for weakness. Negative for focal weakness.  Psychiatric/Behavioral: Positive for confusion.     Physical Exam Updated Vital Signs BP 114/72 (BP Location: Right Arm)   Pulse 88   Temp 99.9 F (37.7 C) (Rectal)   Resp 14   SpO2 93%   Physical Exam  Constitutional: He appears well-developed and well-nourished.  HENT:  Head: Normocephalic and atraumatic.  Eyes: Conjunctivae and EOM are normal. Pupils are equal, round, and reactive to light.  Neck: Normal range of motion.  Cardiovascular:  Tachycardia present.   Pulmonary/Chest: Effort normal. Tachypnea noted. No respiratory distress.  Difficult to auscultate, but given limitations of poor effort, no obvious abnormal sounds.  Abdominal: He exhibits no distension. There is tenderness (diffuse).  Musculoskeletal: Normal range of motion.  Neurological: He is alert. No cranial nerve deficit.  Skin: Skin is warm and dry.  Nursing note and vitals reviewed.    ED Treatments / Results  Labs (all labs ordered are listed, but only abnormal results are displayed) Labs Reviewed  CBC WITH DIFFERENTIAL/PLATELET - Abnormal; Notable for the following:       Result Value   WBC 13.3 (*)    Neutro Abs 10.8 (*)    All other components within normal limits  COMPREHENSIVE METABOLIC PANEL - Abnormal; Notable for the following:    CO2 21 (*)    Glucose, Bld 126 (*)    BUN 64 (*)    Creatinine, Ser 4.74 (*)    Total Protein 9.7 (*)    GFR calc non Af Amer 11 (*)    GFR calc Af Amer 13 (*)    All other components within normal limits  URINALYSIS, ROUTINE W REFLEX MICROSCOPIC - Abnormal; Notable for the following:    APPearance HAZY (*)    Protein, ur 30 (*)    Bacteria, UA RARE (*)    All other components within normal limits  TROPONIN I - Abnormal; Notable for the following:    Troponin I 0.04 (*)    All other components within normal limits  CULTURE, BLOOD (ROUTINE X 2)  CULTURE, BLOOD (ROUTINE X 2)  LIPASE, BLOOD  I-STAT CG4 LACTIC ACID, ED  I-STAT CG4 LACTIC ACID, ED    EKG  EKG Interpretation None       Radiology Dg Chest 2 View  Result Date: 07/14/2016 CLINICAL DATA:  Altered mental status EXAM: CHEST  2 VIEW COMPARISON:  04/14/2016 FINDINGS: Normal heart size. Stable aortic tortuosity. Mild streaky density at the left base. No edema, air bronchogram, effusion, or pneumothorax. IMPRESSION: Mild atelectasis at the left base. Electronically Signed   By: Marnee SpringJonathon  Watts M.D.   On: 07/14/2016 16:21   Ct Head Wo  Contrast  Result Date: 07/14/2016 CLINICAL DATA:  Altered mental status, decreased level of consciousness. Abdominal pain. EXAM: CT HEAD WITHOUT CONTRAST TECHNIQUE: Contiguous axial images were obtained from the base of the skull through the vertex without intravenous contrast. COMPARISON:  Head CT dated 06/16/2016. FINDINGS: Brain: Mild generalized parenchymal atrophy with commensurate dilatation of the ventricles and sulci. Mild chronic small vessel ischemic changes within the bilateral periventricular white matter. Small old lacunar infarcts within the basal ganglia regions bilaterally. There is no mass, hemorrhage, edema or other evidence of acute parenchymal abnormality. No extra-axial hemorrhage. Vascular: No hyperdense  vessel or unexpected calcification. Skull: Normal. Negative for fracture or focal lesion. Sinuses/Orbits: No acute finding. Other: None. IMPRESSION: 1. No acute findings.  No intracranial mass, hemorrhage or edema. 2. Chronic ischemic changes, as detailed above. Electronically Signed   By: Bary Richard M.D.   On: 07/14/2016 16:09   US Scrotum  Result Date: 07/14/2016 CLINICAL DATA:  Swelling, evaluate for fluid collection or abscess EXAM: ULTRASOUND OF SCROTUM TECHNIQUE: Complete ultrasound examination of the testicles, epididymis, and other scrotal structures was performed. COMPARISON:  CT 07/14/2016, ultrasound 04/02/2016 FINDINGS: Right testicle Measurements: 3.5 x 1.3 x 2.4 cm. Several tiny benign-appearing cysts, the larger measures 0.3 x 0.2 x 0.3 cm. Left testicle Measurements: 4.1 x 1.7 x 4.2 cm. Several cysts again visualized. The largest measures 1.5 x 0.9 x 1.3 cm. Right epididymis: Multiple tiny cysts in the epididymal head. Simple cyst in the body measuring 1 x 0.3 x 0.8 cm. Mildly complex at the epididymal head measuring 2.1 x 1.1 x 1.5 cm, contains a small amount of internal debris. Left epididymis:  Normal in size and appearance. Hydrocele: Previously noted large left  hydrocele has been drained. Nonspecific hypoechoic tissue visualized in the left scrotum. Varicocele:  None visualized. IMPRESSION: 1. Interim drainage of large left hydrocele. Nonspecific hypoechoic tissue remains within the left scrotum. No recurrent fluid collection is seen. 2. Benign-appearing intra testicular cysts 3. Mildly complex cyst in the right epididymal head, which contains a small amount of debris. Electronically Signed   By: Jasmine Pang M.D.   On: 07/14/2016 21:54   Ct Renal Stone Study  Result Date: 07/14/2016 CLINICAL DATA:  Altered mental status pain in the abdomen increased weakness EXAM: CT ABDOMEN AND PELVIS WITHOUT CONTRAST TECHNIQUE: Multidetector CT imaging of the abdomen and pelvis was performed following the standard protocol without IV contrast. COMPARISON:  None. FINDINGS: Lower chest: Lung bases demonstrate linear atelectasis or scarring in the right middle lobe and lung bases. No consolidation or effusion. Heart size upper normal. Hepatobiliary: No biliary dilatation. No focal hepatic abnormality. Prominent gallbladder fundus. No calcified stones. Pancreas: Unremarkable. No pancreatic ductal dilatation or surrounding inflammatory changes. Spleen: Normal in size without focal abnormality. Adrenals/Urinary Tract: Adrenal glands are unremarkable. Kidneys are normal, without renal calculi, focal lesion, or hydronephrosis. Bladder is unremarkable. Stomach/Bowel: Stomach is within normal limits. Appendix appears normal. No evidence of bowel wall thickening, distention, or inflammatory changes. Extensive sigmoid colon diverticular disease without acute inflammation. Vascular/Lymphatic: Aortic atherosclerosis. No enlarged abdominal or pelvic lymph nodes. Reproductive: Slightly enlarged prostate Other: No free air or free fluid.  Small fat in the umbilicus. Musculoskeletal: Degenerative changes of the spine. No acute or suspicious bone lesion. IMPRESSION: 1. No definite CT evidence for  acute intra-abdominal or pelvic pathology. Negative for kidney stone, hydronephrosis or ureteral stone. 2. Extensive sigmoid colon diverticular disease without acute inflammation. Electronically Signed   By: Jasmine Pang M.D.   On: 07/14/2016 16:16    Procedures Procedures (including critical care time)  Medications Ordered in ED Medications  sodium chloride 0.9 % bolus 1,000 mL (0 mLs Intravenous Stopped 07/14/16 1640)  vancomycin (VANCOCIN) IVPB 1000 mg/200 mL premix (1,000 mg Intravenous New Bag/Given 07/14/16 2320)  piperacillin-tazobactam (ZOSYN) IVPB 3.375 g (3.375 g Intravenous New Bag/Given 07/14/16 2320)  sodium chloride 0.9 % bolus 1,000 mL (0 mLs Intravenous Stopped 07/14/16 2130)     Initial Impression / Assessment and Plan / ED Course  I have reviewed the triage vital signs and the nursing notes.  Pertinent  labs & imaging results that were available during my care of the patient were reviewed by me and considered in my medical decision making (see chart for details).    On reviow of records had significant AKI recent labs, nurse notes state he is not improving per family history. Will do septic workup. Patient states abdominal pain, with anticipated poor kidney function, will do non con scan.  Scan unremarkable. With scrotal swelling, eval for abscess but no fluid collections seen. cxr with streaking in bases, PNA? abx given. Plan for admission for following creatinine.   Final Clinical Impressions(s) / ED Diagnoses   Final diagnoses:  Acute renal failure, unspecified acute renal failure type [facility])      Prudy Candy, Barbara Cower, MD 07/15/16 1610

## 2016-07-14 NOTE — ED Notes (Signed)
Blood draw delayed.  Pt currently having ultrasound done.

## 2016-07-14 NOTE — ED Notes (Addendum)
This RN spoke with sister at this time. She states that pt recently had hernia surgery 2 weeks ago. Pt also has had a steady failure to thrive since wife died 1 yr ago and pt was relocated from [place]. Pt sister states "I think he's depressed."

## 2016-07-14 NOTE — ED Triage Notes (Signed)
Patient is a poor historian. Patient does not answer questions. Patient's family member states LOC has decreased. Patient states his abdomen hurts a lot and points to the mid abdomen. Patient's brother states he is not eating and having increased weakness.

## 2016-07-14 NOTE — ED Notes (Signed)
Pt Is aware that a urine sample is needed and did attempt to try but was unable to get one at this time

## 2016-07-14 NOTE — ED Notes (Signed)
Writer unable to draw labs.

## 2016-07-14 NOTE — H&P (Signed)
History and Physical    Andrew Cuevas ZOX:[id number] DOB: 02-04-1946 DOA: 07/14/2016  PCP: Sharlene Dory, DO  Patient coming from: Home  Chief Complaint: feeling sick  HPI: Andrew Cuevas is a 71 y.o. male with medical history significant of HTN and CVA presenting because he "feels sick."  He is unable to provide additional history and is unaccompanied.   ER notes also report AMS, confusion.  His brother reported minimal PO intake and increased weakness.  His sister reported FTT since his wife died a year ago and the patient was moved from [place] to [facility] and thinks he is depressed.  He had a left hydrocelectomy on 5/3.  His labs were abnormal on 5/10 (creatinine up from 1.26 to 2.91) and so chlorthalidone was stopped.   ED Course: Acute renal failure.  Negative abdominal CT and scrotal US.  CXR "with streaking in bases, PNA? abx given."  Review of Systems: Unable to perform  PMH, PSH, FH, and SH reviewed in Epic but unable to review with patient due to AMS  Past Medical History:  Diagnosis Date  . Chronic headaches   . CVA (cerebral vascular accident) (HCC)   . HTN (hypertension), malignant     Past Surgical History:  Procedure Laterality Date  . HERNIA REPAIR Bilateral   . HYDROCELE EXCISION Left 07/02/2016   Procedure: HYDROCELECTOMY ADULT;  Surgeon: Bjorn Pippin, MD;  Location: [facility];  Service: Urology;  Laterality: Left;    Social History   Social History  . Marital status: Single    Spouse name: N/A  . Number of children: N/A  . Years of education: N/A   Occupational History  . Not on file.   Social History Main Topics  . Smoking status: Former Smoker    Packs/day: 0.50    Types: Cigarettes    Quit date: 04/27/2016  . Smokeless tobacco: Never Used  . Alcohol use No  . Drug use: No  . Sexual activity: Not on file   Other Topics Concern  . Not on file   Social History Narrative   Lives with sister.     No Known Allergies  Family History  Problem  Relation Age of Onset  . Hypertension Mother   . Arthritis Mother   . Alzheimer's disease Father   . Stroke Sister     Prior to Admission medications   Medication Sig Start Date End Date Taking? Authorizing Provider  amLODipine (NORVASC) 5 MG tablet Take 1 tablet (5 mg total) by mouth daily. 06/29/16  Yes Sharlene Dory, DO  cloNIDine (CATAPRES) 0.1 MG tablet Take 1 tablet (0.1 mg total) by mouth 2 (two) times daily. 04/17/16  Yes Love, Evlyn Kanner, PA-C  HYDROcodone-acetaminophen (NORCO) 5-325 MG tablet Take 1 tablet by mouth every 6 (six) hours as needed for moderate pain. 07/02/16  Yes Bjorn Pippin, MD  losartan (COZAAR) 100 MG tablet Take 1 tablet (100 mg total) by mouth daily. 06/29/16  Yes Sharlene Dory, DO  omeprazole (PRILOSEC) 20 MG capsule Take 1 capsule (20 mg total) by mouth 2 (two) times daily before a meal. 06/29/16  Yes Wendling, Jilda Roche, DO  senna-docusate (SENOKOT-S) 8.6-50 MG tablet Take 1 tablet by mouth 2 (two) times daily. 06/29/16  Yes Sharlene Dory, DO  chlorthalidone (HYGROTON) 25 MG tablet Take 1 tablet (25 mg total) by mouth daily. Patient not taking: Reported on 07/14/2016 06/29/16   Sharlene Dory, DO    Physical Exam: Vitals:   07/14/16 2300 07/14/16  2318 07/14/16 2335 07/15/16 0008  BP: 124/81  119/80 114/72  Pulse: 83  85 88  Resp: 14  12 14   Temp:      TempSrc:      SpO2: 95% 99% 97% 93%     General:  Appears calm and comfortable and is NAD, whispers in response to questions when he attempts to respond Eyes:  PERRL, EOMI, normal lids, iris ENT:  grossly normal hearing, lips & tongue, dry mm Neck:  no LAD, masses or thyromegaly Cardiovascular:  RRR, no m/r/g. No LE edema.  Respiratory:  CTA bilaterally, no w/r/r. Normal respiratory effort. Abdomen:  soft, minimally diffusely tender to palpation, nd, NABS Skin:  no rash or induration seen on limited exam Musculoskeletal:  grossly normal tone BUE/BLE, good ROM, no bony  abnormality Psychiatric:  Oriented to person (first name only) and place (knows he is in a hospital).  Whispers in answers to questions when he tries to answer them at all.  Neurologic:  unable to assess  Labs on Admission: I have personally reviewed following labs and imaging studies  CBC:  Recent Labs Lab 07/14/16 1532  WBC 13.3*  NEUTROABS 10.8*  HGB 14.2  HCT 43.2  MCV 81.2  PLT 316   Basic Metabolic Panel:  Recent Labs Lab 07/09/16 1341 07/14/16 1532  NA 139 141  K 3.6 4.2  CL 103 108  CO2 22 21*  GLUCOSE 103* 126*  BUN 44* 64*  CREATININE 2.91* 4.74*  CALCIUM 10.0 10.0   GFR: Estimated Creatinine Clearance: 14.5 mL/min (A) (by C-G formula based on SCr of 4.74 mg/dL (H)). Liver Function Tests:  Recent Labs Lab 07/14/16 1532  AST 24  ALT 17  ALKPHOS 71  BILITOT 0.9  PROT 9.7*  ALBUMIN 4.1    Recent Labs Lab 07/14/16 1532  LIPASE 18   No results for input(s): AMMONIA in the last 168 hours. Coagulation Profile: No results for input(s): INR, PROTIME in the last 168 hours. Cardiac Enzymes:  Recent Labs Lab 07/14/16 1532  TROPONINI 0.04*   BNP (last 3 results) No results for input(s): PROBNP in the last 8760 hours. HbA1C: No results for input(s): HGBA1C in the last 72 hours. CBG: No results for input(s): GLUCAP in the last 168 hours. Lipid Profile: No results for input(s): CHOL, HDL, LDLCALC, TRIG, CHOLHDL, LDLDIRECT in the last 72 hours. Thyroid Function Tests: No results for input(s): TSH, T4TOTAL, FREET4, T3FREE, THYROIDAB in the last 72 hours. Anemia Panel: No results for input(s): VITAMINB12, FOLATE, FERRITIN, TIBC, IRON, RETICCTPCT in the last 72 hours. Urine analysis:    Component Value Date/Time   COLORURINE YELLOW 07/14/2016 1916   APPEARANCEUR HAZY (A) 07/14/2016 1916   LABSPEC 1.017 07/14/2016 1916   PHURINE 5.0 07/14/2016 1916   GLUCOSEU NEGATIVE 07/14/2016 1916   HGBUR NEGATIVE 07/14/2016 1916   BILIRUBINUR NEGATIVE  07/14/2016 1916   KETONESUR NEGATIVE 07/14/2016 1916   PROTEINUR 30 (A) 07/14/2016 1916   NITRITE NEGATIVE 07/14/2016 1916   LEUKOCYTESUR NEGATIVE 07/14/2016 1916    Creatinine Clearance: Estimated Creatinine Clearance: 14.5 mL/min (A) (by C-G formula based on SCr of 4.74 mg/dL (H)).  Sepsis Labs: @LABRCNTIP (procalcitonin:4,lacticidven:4) )No results found for this or any previous visit (from the past 240 hour(s)).   Radiological Exams on Admission: Dg Chest 2 View  Result Date: 07/14/2016 CLINICAL DATA:  Altered mental status EXAM: CHEST  2 VIEW COMPARISON:  04/14/2016 FINDINGS: Normal heart size. Stable aortic tortuosity. Mild streaky density at the left base. No edema,  air bronchogram, effusion, or pneumothorax. IMPRESSION: Mild atelectasis at the left base. Electronically Signed   By: Marnee Spring M.D.   On: 07/14/2016 16:21   Ct Head Wo Contrast  Result Date: 07/14/2016 CLINICAL DATA:  Altered mental status, decreased level of consciousness. Abdominal pain. EXAM: CT HEAD WITHOUT CONTRAST TECHNIQUE: Contiguous axial images were obtained from the base of the skull through the vertex without intravenous contrast. COMPARISON:  Head CT dated 06/16/2016. FINDINGS: Brain: Mild generalized parenchymal atrophy with commensurate dilatation of the ventricles and sulci. Mild chronic small vessel ischemic changes within the bilateral periventricular white matter. Small old lacunar infarcts within the basal ganglia regions bilaterally. There is no mass, hemorrhage, edema or other evidence of acute parenchymal abnormality. No extra-axial hemorrhage. Vascular: No hyperdense vessel or unexpected calcification. Skull: Normal. Negative for fracture or focal lesion. Sinuses/Orbits: No acute finding. Other: None. IMPRESSION: 1. No acute findings.  No intracranial mass, hemorrhage or edema. 2. Chronic ischemic changes, as detailed above. Electronically Signed   By: Bary Richard M.D.   On: 07/14/2016 16:09    US Scrotum  Result Date: 07/14/2016 CLINICAL DATA:  Swelling, evaluate for fluid collection or abscess EXAM: ULTRASOUND OF SCROTUM TECHNIQUE: Complete ultrasound examination of the testicles, epididymis, and other scrotal structures was performed. COMPARISON:  CT 07/14/2016, ultrasound 04/02/2016 FINDINGS: Right testicle Measurements: 3.5 x 1.3 x 2.4 cm. Several tiny benign-appearing cysts, the larger measures 0.3 x 0.2 x 0.3 cm. Left testicle Measurements: 4.1 x 1.7 x 4.2 cm. Several cysts again visualized. The largest measures 1.5 x 0.9 x 1.3 cm. Right epididymis: Multiple tiny cysts in the epididymal head. Simple cyst in the body measuring 1 x 0.3 x 0.8 cm. Mildly complex at the epididymal head measuring 2.1 x 1.1 x 1.5 cm, contains a small amount of internal debris. Left epididymis:  Normal in size and appearance. Hydrocele: Previously noted large left hydrocele has been drained. Nonspecific hypoechoic tissue visualized in the left scrotum. Varicocele:  None visualized. IMPRESSION: 1. Interim drainage of large left hydrocele. Nonspecific hypoechoic tissue remains within the left scrotum. No recurrent fluid collection is seen. 2. Benign-appearing intra testicular cysts 3. Mildly complex cyst in the right epididymal head, which contains a small amount of debris. Electronically Signed   By: Jasmine Pang M.D.   On: 07/14/2016 21:54   Ct Renal Stone Study  Result Date: 07/14/2016 CLINICAL DATA:  Altered mental status pain in the abdomen increased weakness EXAM: CT ABDOMEN AND PELVIS WITHOUT CONTRAST TECHNIQUE: Multidetector CT imaging of the abdomen and pelvis was performed following the standard protocol without IV contrast. COMPARISON:  None. FINDINGS: Lower chest: Lung bases demonstrate linear atelectasis or scarring in the right middle lobe and lung bases. No consolidation or effusion. Heart size upper normal. Hepatobiliary: No biliary dilatation. No focal hepatic abnormality. Prominent gallbladder  fundus. No calcified stones. Pancreas: Unremarkable. No pancreatic ductal dilatation or surrounding inflammatory changes. Spleen: Normal in size without focal abnormality. Adrenals/Urinary Tract: Adrenal glands are unremarkable. Kidneys are normal, without renal calculi, focal lesion, or hydronephrosis. Bladder is unremarkable. Stomach/Bowel: Stomach is within normal limits. Appendix appears normal. No evidence of bowel wall thickening, distention, or inflammatory changes. Extensive sigmoid colon diverticular disease without acute inflammation. Vascular/Lymphatic: Aortic atherosclerosis. No enlarged abdominal or pelvic lymph nodes. Reproductive: Slightly enlarged prostate Other: No free air or free fluid.  Small fat in the umbilicus. Musculoskeletal: Degenerative changes of the spine. No acute or suspicious bone lesion. IMPRESSION: 1. No definite CT evidence for acute intra-abdominal  or pelvic pathology. Negative for kidney stone, hydronephrosis or ureteral stone. 2. Extensive sigmoid colon diverticular disease without acute inflammation. Electronically Signed   By: Jasmine PangKim  Fujinaga M.D.   On: 07/14/2016 16:16    EKG: pending  Assessment/Plan Principal Problem:   Acute renal failure (ARF) (HCC) Active Problems:   Left hydrocele   Benign essential HTN   Stage 3 chronic kidney disease   Failure to thrive in adult   Acute renal failure on CKD -Baseline creatinine was 1.26 on 4/30 -Creatinine on 5/10 was 2.91; chlorthalidone was to be stopped at that time. -Today's creatinine is  4.74  and BUN is 64 on admission.  -Likely due to prerenal failure secondary to dehydration and continuation of ARB. -Replete with IIVF  -Check FeNa -Renal CT negative -Follow up renal function by BMP -Avoid nephrotoxic agents including ACE/ARB and NSAIDs -UA without infection -Lactate 1.63, 1.42  Failure to thrive -Family reports possible depression, patient with decreased PO intake -Currently no indication of  infection as cause other than minimally elevated WBC 13.3 -He received 1 dose of Vanc/Zosyn in the ER; will not continue at this time -Troponin 0.04 - likely related to renal failure but will trend -Reported h/o cocaine abuse, will check UDS -Glucose 126 - May be stress response; will follow with fasting AM labs  HTN -Hold (discontinue?) ARB, as above -Hold Norvasc as well due to borderline low BP -Continue Catapres to avoid rebound hypertension  Hydrocele -s/p recent hydrocelectomy -Negative scrotal US  DVT prophylaxis:  Lovenox  Code Status:  Full  Family Communication: None present Disposition Plan:  Home once clinically improved Consults called: None  Admission status: Admit - It is my clinical opinion that admission to INPATIENT is reasonable and necessary because this patient will require at least 2 midnights in the hospital to treat this condition based on the medical complexity of the problems presented.  Given the aforementioned information, the predictability of an adverse outcome is felt to be significant.    Jonah BlueJennifer Hayleigh Bawa MD [facility]  If 7PM-7AM, please contact night-coverage [email or web address] Password [email or web address]  07/15/2016, 1:11 AM

## 2016-07-14 NOTE — ED Notes (Signed)
Please notify Drue FlirtGerardus Allaire, son, at [phone] when the room is ready.

## 2016-07-15 DIAGNOSIS — R627 Adult failure to thrive: Secondary | ICD-10-CM | POA: Diagnosis present

## 2016-07-15 DIAGNOSIS — N183 Chronic kidney disease, stage 3 (moderate): Secondary | ICD-10-CM

## 2016-07-15 DIAGNOSIS — N433 Hydrocele, unspecified: Secondary | ICD-10-CM

## 2016-07-15 DIAGNOSIS — I10 Essential (primary) hypertension: Secondary | ICD-10-CM

## 2016-07-15 DIAGNOSIS — N179 Acute kidney failure, unspecified: Principal | ICD-10-CM

## 2016-07-15 LAB — BASIC METABOLIC PANEL
ANION GAP: 10 (ref 5–15)
BUN: 61 mg/dL — ABNORMAL HIGH (ref 6–20)
CHLORIDE: 112 mmol/L — AB (ref 101–111)
CO2: 22 mmol/L (ref 22–32)
CREATININE: 3.5 mg/dL — AB (ref 0.61–1.24)
Calcium: 9.2 mg/dL (ref 8.9–10.3)
GFR calc non Af Amer: 16 mL/min — ABNORMAL LOW (ref >60)
GFR, EST AFRICAN AMERICAN: 19 mL/min — AB (ref >60)
Glucose, Bld: 101 mg/dL — ABNORMAL HIGH (ref 65–99)
Potassium: 3.9 mmol/L (ref 3.5–5.1)
Sodium: 144 mmol/L (ref 135–145)

## 2016-07-15 LAB — CBC
HCT: 36.2 % — ABNORMAL LOW (ref 39.0–52.0)
HEMOGLOBIN: 11.7 g/dL — AB (ref 13.0–17.0)
MCH: 26.5 pg (ref 26.0–34.0)
MCHC: 32.3 g/dL (ref 30.0–36.0)
MCV: 82.1 fL (ref 78.0–100.0)
Platelets: 246 10*3/uL (ref 150–400)
RBC: 4.41 MIL/uL (ref 4.22–5.81)
RDW: 15.4 % (ref 11.5–15.5)
WBC: 9.7 10*3/uL (ref 4.0–10.5)

## 2016-07-15 LAB — TROPONIN I
Troponin I: 0.03 ng/mL (ref <0.03)
Troponin I: <0.03 ng/mL (ref <0.03)
Troponin I: <0.03 ng/mL (ref <0.03)

## 2016-07-15 LAB — RAPID URINE DRUG SCREEN, HOSP PERFORMED
Amphetamines: NOT DETECTED
BENZODIAZEPINES: NOT DETECTED
Barbiturates: NOT DETECTED
Cocaine: NOT DETECTED
Opiates: NOT DETECTED
Tetrahydrocannabinol: NOT DETECTED

## 2016-07-15 LAB — NA AND K (SODIUM & POTASSIUM), RAND UR
POTASSIUM UR: 55 mmol/L
SODIUM UR: 48 mmol/L

## 2016-07-15 MED ORDER — ONDANSETRON HCL 4 MG PO TABS: 4.0000 mg | ORAL_TABLET | Freq: Four times a day (QID) | ORAL | Status: DC | PRN

## 2016-07-15 MED ORDER — CLONIDINE HCL 0.1 MG PO TABS
0.1000 mg | ORAL_TABLET | Freq: Two times a day (BID) | ORAL | Status: DC
  Administered 2016-07-15 – 2016-07-17 (×5): 0.1 mg via ORAL
  Filled 2016-07-15 (×5): qty 1

## 2016-07-15 MED ORDER — ACETAMINOPHEN 325 MG PO TABS: 650.0000 mg | ORAL_TABLET | Freq: Four times a day (QID) | ORAL | Status: DC | PRN

## 2016-07-15 MED ORDER — ACETAMINOPHEN 650 MG RE SUPP: 650.0000 mg | Freq: Four times a day (QID) | RECTAL | Status: DC | PRN

## 2016-07-15 MED ORDER — ONDANSETRON HCL 4 MG/2ML IJ SOLN: 4.0000 mg | Freq: Four times a day (QID) | INTRAMUSCULAR | Status: DC | PRN

## 2016-07-15 MED ORDER — SENNOSIDES-DOCUSATE SODIUM 8.6-50 MG PO TABS
1.0000 | ORAL_TABLET | Freq: Two times a day (BID) | ORAL | Status: DC
  Administered 2016-07-15 – 2016-07-16 (×3): 1 via ORAL
  Filled 2016-07-15 (×2): qty 1

## 2016-07-15 MED ORDER — ENSURE ENLIVE PO LIQD
237.0000 mL | Freq: Two times a day (BID) | ORAL | Status: DC
  Administered 2016-07-15 – 2016-07-17 (×4): 237 mL via ORAL

## 2016-07-15 MED ORDER — PANTOPRAZOLE SODIUM 40 MG PO TBEC
40.0000 mg | DELAYED_RELEASE_TABLET | Freq: Every day | ORAL | Status: DC
  Administered 2016-07-15 – 2016-07-17 (×3): 40 mg via ORAL
  Filled 2016-07-15 (×3): qty 1

## 2016-07-15 MED ORDER — HYDROCODONE-ACETAMINOPHEN 5-325 MG PO TABS
1.0000 | ORAL_TABLET | Freq: Four times a day (QID) | ORAL | Status: DC | PRN
  Administered 2016-07-17: 1 via ORAL
  Filled 2016-07-15: qty 1

## 2016-07-15 MED ORDER — ENOXAPARIN SODIUM 30 MG/0.3ML ~~LOC~~ SOLN
30.0000 mg | SUBCUTANEOUS | Status: DC
  Administered 2016-07-15 – 2016-07-16 (×2): 30 mg via SUBCUTANEOUS
  Filled 2016-07-15 (×2): qty 0.3

## 2016-07-15 MED ORDER — SODIUM CHLORIDE 0.9 % IV SOLN
INTRAVENOUS | Status: DC
  Administered 2016-07-15 – 2016-07-17 (×6): via INTRAVENOUS

## 2016-07-15 NOTE — Progress Notes (Addendum)
PROGRESS NOTE    Andrew AbbottJohn Cuevas  QMV:[id number] DOB: 1945/04/03 DOA: 07/14/2016 PCP: Sharlene DoryWendling, Nicholas Paul, DO   Chief Complaint  Patient presents with  . not eating  . Weakness  . Abdominal Pain    Brief Narrative:  HPI on 07/14/2016 by Dr. Jonah BlueJennifer Yates Andrew AbbottJohn Cuevas is a 71 y.o. male with medical history significant of HTN and CVA presenting because he "feels sick."  He is unable to provide additional history and is unaccompanied.   ER notes also report AMS, confusion.  His brother reported minimal PO intake and increased weakness.  His sister reported FTT since his wife died a year ago and the [place] was [place] [facility] and thinks he is depressed. He had a left hydrocelectomy on 5/3.  His labs were abnormal on 5/10 (creatinine up from 1.26 to 2.91) and so chlorthalidone was stopped.  Assessment & Plan   Acute kidney injury on chronic kidney disease, stage III -Patient status post left hydrocelectomy on 07/02/2016, creatinine noted to be 2.91 on 07/09/2016. Chlorthalidone was discontinued at that time -Patient's baseline creatinine approximately 1.2-1.3 -Upon admission, creatinine 4.74 -Placed on IVF -Creatinine currently 3.5 -Patient also noted to be on losartan -Renal CT unremarkable -UA unremarkable for infection -Avoid nephrotoxic agents  Failure to thrive with decreased oral intake -Possibly due to depression -Will consult nutrition  Leukocytosis  -Resolved, WBC was 13.3 on admission -patient did receive 1 dose of vancomycin and Zosyn in the emergency department -Patient currently afebrile with no source of infection (UA and CXR unremarkable)  History of cocaine use -UDS negative  Essential hypertension -Continue Catapres -Losartan and amlodipine currently held -BP stable  Hydrocele -As above, patient recently status post hydrocelectomy -Scrotal ultrasound unremarkable  Mildly elevated troponin -In the setting of renal failure -No complaints of chest  pain -Troponin trending down to 0.03 (peaked 0.04)  DVT Prophylaxis  Lovenox  Code Status: Full  Family Communication: None at bedside  Disposition Plan: Admitted, pending improvement in creatinine  Consultants None  Procedures  Scrotal US  Antibiotics   Anti-infectives    Start     Dose/Rate Route Frequency Ordered Stop   07/14/16 1930  vancomycin (VANCOCIN) IVPB 1000 mg/200 mL premix     1,000 mg 200 mL/hr over 60 Minutes Intravenous  Once 07/14/16 1918 07/15/16 0020   07/14/16 1930  piperacillin-tazobactam (ZOSYN) IVPB 3.375 g     3.375 g 100 mL/hr over 30 Minutes Intravenous  Once 07/14/16 1918 07/14/16 2350      Subjective:   Andrew Cuevas seen and examined today.  Patient denies chest pain, Shortness of breath, abdominal pain, nausea or vomiting, diarrhea constipation. Does endorse having poor oral intake and appetite.  Objective:   Vitals:   07/14/16 2318 07/14/16 2335 07/15/16 0008 07/15/16 0128  BP:  119/80 114/72 120/85  Pulse:  85 88 76  Resp:  12 14 16   Temp:    98 F (36.7 C)  TempSrc:    Oral  SpO2: 99% 97% 93% 98%  Weight:    76.9 kg (169 lb 8.5 oz)  Height:    5\' 9"  (1.753 m)    Intake/Output Summary (Last 24 hours) at 07/15/16 1403 Last data filed at 07/15/16 1026  Gross per 24 hour  Intake             1250 ml  Output                0 ml  Net  1250 ml   Filed Weights   07/15/16 0128  Weight: 76.9 kg (169 lb 8.5 oz)    Exam  General: Well developed, well nourished, NAD, appears stated age  HEENT: NCAT, mucous membranes dry  Cardiovascular: S1 S2 auscultated, no rubs, murmurs or gallops. Regular rate and rhythm.  Respiratory: Clear to auscultation bilaterally with equal chest rise  Abdomen: Soft, mild lower abdominal TTP, nondistended, + bowel sounds  Extremities: warm dry without cyanosis clubbing or edema  Neuro: AAOx3, nonfocal  Psych: Normal affect and demeanor    Data Reviewed: I have personally reviewed  following labs and imaging studies  CBC:  Recent Labs Lab 07/14/16 1532 07/15/16 0305  WBC 13.3* 9.7  NEUTROABS 10.8*  --   HGB 14.2 11.7*  HCT 43.2 36.2*  MCV 81.2 82.1  PLT 316 246   Basic Metabolic Panel:  Recent Labs Lab 07/09/16 1341 07/14/16 1532 07/15/16 0305  NA 139 141 144  K 3.6 4.2 3.9  CL 103 108 112*  CO2 22 21* 22  GLUCOSE 103* 126* 101*  BUN 44* 64* 61*  CREATININE 2.91* 4.74* 3.50*  CALCIUM 10.0 10.0 9.2   GFR: Estimated Creatinine Clearance: 19.1 mL/min (A) (by C-G formula based on SCr of 3.5 mg/dL (H)). Liver Function Tests:  Recent Labs Lab 07/14/16 1532  AST 24  ALT 17  ALKPHOS 71  BILITOT 0.9  PROT 9.7*  ALBUMIN 4.1    Recent Labs Lab 07/14/16 1532  LIPASE 18   No results for input(s): AMMONIA in the last 168 hours. Coagulation Profile: No results for input(s): INR, PROTIME in the last 168 hours. Cardiac Enzymes:  Recent Labs Lab 07/14/16 1532 07/15/16 0305 07/15/16 0818  TROPONINI 0.04* 0.03* <0.03   BNP (last 3 results) No results for input(s): PROBNP in the last 8760 hours. HbA1C: No results for input(s): HGBA1C in the last 72 hours. CBG: No results for input(s): GLUCAP in the last 168 hours. Lipid Profile: No results for input(s): CHOL, HDL, LDLCALC, TRIG, CHOLHDL, LDLDIRECT in the last 72 hours. Thyroid Function Tests: No results for input(s): TSH, T4TOTAL, FREET4, T3FREE, THYROIDAB in the last 72 hours. Anemia Panel: No results for input(s): VITAMINB12, FOLATE, FERRITIN, TIBC, IRON, RETICCTPCT in the last 72 hours. Urine analysis:    Component Value Date/Time   COLORURINE YELLOW 07/14/2016 1916   APPEARANCEUR HAZY (A) 07/14/2016 1916   LABSPEC 1.017 07/14/2016 1916   PHURINE 5.0 07/14/2016 1916   GLUCOSEU NEGATIVE 07/14/2016 1916   HGBUR NEGATIVE 07/14/2016 1916   BILIRUBINUR NEGATIVE 07/14/2016 1916   KETONESUR NEGATIVE 07/14/2016 1916   PROTEINUR 30 (A) 07/14/2016 1916   NITRITE NEGATIVE 07/14/2016  1916   LEUKOCYTESUR NEGATIVE 07/14/2016 1916   Sepsis Labs: @LABRCNTIP (procalcitonin:4,lacticidven:4)  )No results found for this or any previous visit (from the past 240 hour(s)).    Radiology Studies: Dg Chest 2 View  Result Date: 07/14/2016 CLINICAL DATA:  Altered mental status EXAM: CHEST  2 VIEW COMPARISON:  04/14/2016 FINDINGS: Normal heart size. Stable aortic tortuosity. Mild streaky density at the left base. No edema, air bronchogram, effusion, or pneumothorax. IMPRESSION: Mild atelectasis at the left base. Electronically Signed   By: Marnee Spring M.D.   On: 07/14/2016 16:21   Ct Head Wo Contrast  Result Date: 07/14/2016 CLINICAL DATA:  Altered mental status, decreased level of consciousness. Abdominal pain. EXAM: CT HEAD WITHOUT CONTRAST TECHNIQUE: Contiguous axial images were obtained from the base of the skull through the vertex without intravenous contrast. COMPARISON:  Head  CT dated 06/16/2016. FINDINGS: Brain: Mild generalized parenchymal atrophy with commensurate dilatation of the ventricles and sulci. Mild chronic small vessel ischemic changes within the bilateral periventricular white matter. Small old lacunar infarcts within the basal ganglia regions bilaterally. There is no mass, hemorrhage, edema or other evidence of acute parenchymal abnormality. No extra-axial hemorrhage. Vascular: No hyperdense vessel or unexpected calcification. Skull: Normal. Negative for fracture or focal lesion. Sinuses/Orbits: No acute finding. Other: None. IMPRESSION: 1. No acute findings.  No intracranial mass, hemorrhage or edema. 2. Chronic ischemic changes, as detailed above. Electronically Signed   By: Bary Richard M.D.   On: 07/14/2016 16:09   US Scrotum  Result Date: 07/14/2016 CLINICAL DATA:  Swelling, evaluate for fluid collection or abscess EXAM: ULTRASOUND OF SCROTUM TECHNIQUE: Complete ultrasound examination of the testicles, epididymis, and other scrotal structures was performed.  COMPARISON:  CT 07/14/2016, ultrasound 04/02/2016 FINDINGS: Right testicle Measurements: 3.5 x 1.3 x 2.4 cm. Several tiny benign-appearing cysts, the larger measures 0.3 x 0.2 x 0.3 cm. Left testicle Measurements: 4.1 x 1.7 x 4.2 cm. Several cysts again visualized. The largest measures 1.5 x 0.9 x 1.3 cm. Right epididymis: Multiple tiny cysts in the epididymal head. Simple cyst in the body measuring 1 x 0.3 x 0.8 cm. Mildly complex at the epididymal head measuring 2.1 x 1.1 x 1.5 cm, contains a small amount of internal debris. Left epididymis:  Normal in size and appearance. Hydrocele: Previously noted large left hydrocele has been drained. Nonspecific hypoechoic tissue visualized in the left scrotum. Varicocele:  None visualized. IMPRESSION: 1. Interim drainage of large left hydrocele. Nonspecific hypoechoic tissue remains within the left scrotum. No recurrent fluid collection is seen. 2. Benign-appearing intra testicular cysts 3. Mildly complex cyst in the right epididymal head, which contains a small amount of debris. Electronically Signed   By: Jasmine Pang M.D.   On: 07/14/2016 21:54   Ct Renal Stone Study  Result Date: 07/14/2016 CLINICAL DATA:  Altered mental status pain in the abdomen increased weakness EXAM: CT ABDOMEN AND PELVIS WITHOUT CONTRAST TECHNIQUE: Multidetector CT imaging of the abdomen and pelvis was performed following the standard protocol without IV contrast. COMPARISON:  None. FINDINGS: Lower chest: Lung bases demonstrate linear atelectasis or scarring in the right middle lobe and lung bases. No consolidation or effusion. Heart size upper normal. Hepatobiliary: No biliary dilatation. No focal hepatic abnormality. Prominent gallbladder fundus. No calcified stones. Pancreas: Unremarkable. No pancreatic ductal dilatation or surrounding inflammatory changes. Spleen: Normal in size without focal abnormality. Adrenals/Urinary Tract: Adrenal glands are unremarkable. Kidneys are normal, without  renal calculi, focal lesion, or hydronephrosis. Bladder is unremarkable. Stomach/Bowel: Stomach is within normal limits. Appendix appears normal. No evidence of bowel wall thickening, distention, or inflammatory changes. Extensive sigmoid colon diverticular disease without acute inflammation. Vascular/Lymphatic: Aortic atherosclerosis. No enlarged abdominal or pelvic lymph nodes. Reproductive: Slightly enlarged prostate Other: No free air or free fluid.  Small fat in the umbilicus. Musculoskeletal: Degenerative changes of the spine. No acute or suspicious bone lesion. IMPRESSION: 1. No definite CT evidence for acute intra-abdominal or pelvic pathology. Negative for kidney stone, hydronephrosis or ureteral stone. 2. Extensive sigmoid colon diverticular disease without acute inflammation. Electronically Signed   By: Jasmine Pang M.D.   On: 07/14/2016 16:16     Scheduled Meds: . cloNIDine  0.1 mg Oral BID  . enoxaparin (LOVENOX) injection  30 mg Subcutaneous Q24H  . feeding supplement (ENSURE ENLIVE)  237 mL Oral BID BM  . pantoprazole  40  mg Oral Daily  . senna-docusate  1 tablet Oral BID   Continuous Infusions: . sodium chloride 125 mL/hr at 07/15/16 1037     LOS: 1 day   Time Spent in minutes   30 minutes  Livianna Petraglia D.O. on 07/15/2016 at 2:03 PM  Between 7am to 7pm - Pager - [phone]  After 7pm go to [email or web address] - password [email or web address]  And look for the night coverage person covering for me after hours  [facility] Office  [phone]

## 2016-07-15 NOTE — Progress Notes (Signed)
Initial Nutrition Assessment  DOCUMENTATION CODES:   Severe malnutrition in context of acute illness/injury  INTERVENTION:   -Provide Ensure Enlive po BID, each supplement provides 350 kcal and 20 grams of protein -Provide Magic cup BID with meals, each supplement provides 290 kcal and 9 grams of protein -Encourage PO intake -Placed lunch order for patient -Will place on meal order with assist -RD to continue to monitor  NUTRITION DIAGNOSIS:   Malnutrition (Severe) related to acute illness, lethargy/confusion (depression) as evidenced by percent weight loss, energy intake < or equal to 50% for > or equal to 5 days, moderate depletion of body fat, moderate depletions of muscle mass.  GOAL:   Patient will meet greater than or equal to 90% of their needs  MONITOR:   PO intake, Supplement acceptance, Labs, Weight trends, I & O's  REASON FOR ASSESSMENT:   Malnutrition Screening Tool    ASSESSMENT:   71 y.o. male with medical history significant of HTN and CVA presenting because he "feels sick."  He is unable to provide additional history and is unaccompanied.   ER notes also report AMS, confusion.  His brother reported minimal PO intake and increased weakness.  His sister reported FTT since his wife died a year ago and [place] [facility] and thinks he is depressed.  Patient in room with no family at bedside. Pt unable to provide a lot of history. Pt speaks barely above a whisper. Pt was able to tell RD that he has had poor appetite and has not been eating very well. He states he has not eaten anything today. In meal ordering system, RD noted that a breakfast tray was provided and 50% meal completion was recorded. Pt was able to tell RD what he would like for lunch. Placed lunch order for patient and will place on meal order with assist to ensure he orders meals in a timely manner. Pt also agreed to receiving Magic Cups and ensure supplements.  Per chart review, pt  has lost 55 lb since 1/24 (25% wt loss x 4 months, significant for time frame). Nutrition-Focused physical exam completed. Findings are moderate fat depletion, moderate muscle depletion, and no edema.   Medications: Protonix tablet daily, Senokot-S tablet BID  Labs reviewed: GFR: 19  Diet Order:  Diet regular Room service appropriate? Yes; Fluid consistency: Thin  Skin:  Reviewed, no issues  Last BM:  5/14  Height:   Ht Readings from Last 1 Encounters:  07/15/16 5\' 9"  (1.753 m)    Weight:   Wt Readings from Last 1 Encounters:  07/15/16 169 lb 8.5 oz (76.9 kg)    Ideal Body Weight:  72.7 kg  BMI:  Body mass index is 25.04 kg/m.  Estimated Nutritional Needs:   Kcal:  1900-2100  Protein:  85-95g  Fluid:  1.9L/day  EDUCATION NEEDS:   No education needs identified at this time  Tilda FrancoLindsey Jaquann Guarisco, MS, RD, LDN Pager: [phone] After Hours Pager: [phone]

## 2016-07-16 LAB — BASIC METABOLIC PANEL
ANION GAP: 9 (ref 5–15)
BUN: 41 mg/dL — AB (ref 6–20)
CHLORIDE: 113 mmol/L — AB (ref 101–111)
CO2: 21 mmol/L — ABNORMAL LOW (ref 22–32)
Calcium: 8.8 mg/dL — ABNORMAL LOW (ref 8.9–10.3)
Creatinine, Ser: 2.02 mg/dL — ABNORMAL HIGH (ref 0.61–1.24)
GFR, EST AFRICAN AMERICAN: 36 mL/min — AB (ref >60)
GFR, EST NON AFRICAN AMERICAN: 31 mL/min — AB (ref >60)
Glucose, Bld: 83 mg/dL (ref 65–99)
POTASSIUM: 4.1 mmol/L (ref 3.5–5.1)
SODIUM: 143 mmol/L (ref 135–145)

## 2016-07-16 MED ORDER — ENOXAPARIN SODIUM 40 MG/0.4ML ~~LOC~~ SOLN
40.0000 mg | SUBCUTANEOUS | Status: DC
  Administered 2016-07-17: 40 mg via SUBCUTANEOUS
  Filled 2016-07-16: qty 0.4

## 2016-07-16 NOTE — Progress Notes (Addendum)
PROGRESS NOTE    Andrew Cuevas  ZOX:[id number] DOB: [birth year] DOA: 07/14/2016 PCP: Andrew Dory, DO   Chief Complaint  Patient presents with  . not eating  . Weakness  . Abdominal Pain     Brief Narrative:  HPI on 07/14/2016 by Dr. Toula Moos Cuevas a 70 y.o.malewith medical history significant of HTN and CVA presenting because he "feels sick." He is unable to provide additional history and is unaccompanied. ER notes also report AMS, confusion. His brother reported minimal PO intake and increased weakness. His sister reported FTT since his wife died a year ago and the patient was moved from [place] to [facility] and thinks he is depressed. He had a left hydrocelectomy on 5/3. His labs were abnormal on 5/10 (creatinine up from 1.26 to 2.91) and so chlorthalidone was stopped.  Interim history Given IVF, patient's creatinine is improving.  Assessment & Plan   Acute kidney injury on chronic kidney disease, stage III -Patient status post left hydrocelectomy on 07/02/2016, creatinine noted to be 2.91 on 07/09/2016. Chlorthalidone was discontinued at that time -suspect secondary to medications, patient was on losartan upon admission -Patient's baseline creatinine approximately 1.2-1.3 -Upon admission, creatinine 4.74 -Continue IVF and monitor BMP -Creatinine currently 2.02 -Renal CT unremarkable -UA unremarkable for infection -Avoid nephrotoxic agents  Failure to thrive with decreased oral intake/Severe malnutrition -Possibly due to depression vs decreased Dysgeusia (patient complained of feeling his taste buds feeling different) -Nutrition consulted and recommended supplements -patient feels his taste buds are better today (?due to AKI)  Leukocytosis  -Resolved, WBC was 13.3 on admission -patient did receive 1 dose of vancomycin and Zosyn in the emergency department -Patient currently afebrile with no source of infection (UA and CXR unremarkable)  History of cocaine  use -UDS negative  Essential hypertension -Continue Catapres -Losartan and amlodipine currently held -BP stable 136/74 today without the use of additional medications  Hydrocele -As above, patient recently status post hydrocelectomy -Scrotal ultrasound unremarkable  Mildly elevated troponin -In the setting of renal failure -No complaints of chest pain -Troponin negative x 2 (mildly elevated at 0.04 POC)  DVT Prophylaxis  lovenox  Code Status: Full  Family Communication: None at bedside  Disposition Plan: Admitted. Possibly discharge to home on 07/17/16, pending further improvement in creatinine.  Consultants None  Procedures  Scrotal US  Antibiotics   Anti-infectives    Start     Dose/Rate Route Frequency Ordered Stop   07/14/16 1930  vancomycin (VANCOCIN) IVPB 1000 mg/200 mL premix     1,000 mg 200 mL/hr over 60 Minutes Intravenous  Once 07/14/16 1918 07/15/16 0020   07/14/16 1930  piperacillin-tazobactam (ZOSYN) IVPB 3.375 g     3.375 g 100 mL/hr over 30 Minutes Intravenous  Once 07/14/16 1918 07/14/16 2350      Subjective:   Andrew Cuevas seen and examined today.  Denies chest pain, shortness of breath, abdominal pain, nausea or vomiting. Feels taste buds are getting better however complains that he cannot eat as he does not have his dentures.   Objective:   Vitals:   07/15/16 0128 07/15/16 1435 07/15/16 2000 07/16/16 0540  BP: 120/85 101/65 101/69 136/74  Pulse: 76 65 62 76  Resp: 16 17 16 16   Temp: 98 F (36.7 C) 97.9 F (36.6 C) 98.1 F (36.7 C) 97.6 F (36.4 C)  TempSrc: Oral Oral Oral Oral  SpO2: 98% 91% 99% 94%  Weight: 76.9 kg (169 lb 8.5 oz)     Height: 5\' 9"  (  1.753 m)       Intake/Output Summary (Last 24 hours) at 07/16/16 1059 Last data filed at 07/16/16 0610  Gross per 24 hour  Intake             1850 ml  Output              900 ml  Net              950 ml   Filed Weights   07/15/16 0128  Weight: 76.9 kg (169 lb 8.5 oz)     Exam  General: Well developed, well nourished, NAD, appears stated age  HEENT: NCAT, mucous membranes moist.   Cardiovascular: S1 S2 auscultated, no rubs, murmurs or gallops. Regular rate and rhythm.  Respiratory: Clear to auscultation bilaterally with equal chest rise  Abdomen: Soft, nontender, nondistended, + bowel sounds  Extremities: warm dry without cyanosis clubbing or edema  Neuro: AAOx3, nonfocal  Psych: Normal affect and demeanor with intact judgement and insight   Data Reviewed: I have personally reviewed following labs and imaging studies  CBC:  Recent Labs Lab 07/14/16 1532 07/15/16 0305  WBC 13.3* 9.7  NEUTROABS 10.8*  --   HGB 14.2 11.7*  HCT 43.2 36.2*  MCV 81.2 82.1  PLT 316 246   Basic Metabolic Panel:  Recent Labs Lab 07/09/16 1341 07/14/16 1532 07/15/16 0305 07/16/16 0335  NA 139 141 144 143  K 3.6 4.2 3.9 4.1  CL 103 108 112* 113*  CO2 22 21* 22 21*  GLUCOSE 103* 126* 101* 83  BUN 44* 64* 61* 41*  CREATININE 2.91* 4.74* 3.50* 2.02*  CALCIUM 10.0 10.0 9.2 8.8*   GFR: Estimated Creatinine Clearance: 33.1 mL/min (A) (by C-G formula based on SCr of 2.02 mg/dL (H)). Liver Function Tests:  Recent Labs Lab 07/14/16 1532  AST 24  ALT 17  ALKPHOS 71  BILITOT 0.9  PROT 9.7*  ALBUMIN 4.1    Recent Labs Lab 07/14/16 1532  LIPASE 18   No results for input(s): AMMONIA in the last 168 hours. Coagulation Profile: No results for input(s): INR, PROTIME in the last 168 hours. Cardiac Enzymes:  Recent Labs Lab 07/14/16 1532 07/15/16 0305 07/15/16 0818 07/15/16 1305  TROPONINI 0.04* 0.03* <0.03 <0.03   BNP (last 3 results) No results for input(s): PROBNP in the last 8760 hours. HbA1C: No results for input(s): HGBA1C in the last 72 hours. CBG: No results for input(s): GLUCAP in the last 168 hours. Lipid Profile: No results for input(s): CHOL, HDL, LDLCALC, TRIG, CHOLHDL, LDLDIRECT in the last 72 hours. Thyroid Function  Tests: No results for input(s): TSH, T4TOTAL, FREET4, T3FREE, THYROIDAB in the last 72 hours. Anemia Panel: No results for input(s): VITAMINB12, FOLATE, FERRITIN, TIBC, IRON, RETICCTPCT in the last 72 hours. Urine analysis:    Component Value Date/Time   COLORURINE YELLOW 07/14/2016 1916   APPEARANCEUR HAZY (A) 07/14/2016 1916   LABSPEC 1.017 07/14/2016 1916   PHURINE 5.0 07/14/2016 1916   GLUCOSEU NEGATIVE 07/14/2016 1916   HGBUR NEGATIVE 07/14/2016 1916   BILIRUBINUR NEGATIVE 07/14/2016 1916   KETONESUR NEGATIVE 07/14/2016 1916   PROTEINUR 30 (A) 07/14/2016 1916   NITRITE NEGATIVE 07/14/2016 1916   LEUKOCYTESUR NEGATIVE 07/14/2016 1916   Sepsis Labs: @LABRCNTIP (procalcitonin:4,lacticidven:4)  ) Recent Results (from the past 240 hour(s))  Blood culture (routine x 2)     Status: None (Preliminary result)   Collection Time: 07/14/16  7:16 PM  Result Value Ref Range Status   Specimen Description BLOOD  LEFT ANTECUBITAL  Final   Special Requests   Final    BOTTLES DRAWN AEROBIC AND ANAEROBIC Blood Culture adequate volume   Culture   Final    NO GROWTH 2 DAYS Performed at [facility], [address] [place], [place] [address]    Report Status PENDING  Incomplete  Blood culture (routine x [facility])     Status: None (Preliminary result)   Collection Time: 07/14/16 10:37 PM  Result Value Ref Range Status   Specimen Description BLOOD RIGHT HAND  Final   Special Requests   Final    BOTTLES DRAWN AEROBIC AND ANAEROBIC Blood Culture results may not be optimal due to an inadequate volume of blood received in culture bottles   Culture   Final    NO GROWTH 1 DAY Performed at [facility], [address] [place], [place] [address]    Report Status PENDING  Incomplete      Radiology Studies: Dg Chest 2 View  Result Date: 07/14/2016 CLINICAL DATA:  Altered mental status EXAM: CHEST  2 VIEW COMPARISON:  04/14/2016 FINDINGS: Normal heart size. Stable aortic tortuosity. Mild  streaky density at the left base. No edema, air bronchogram, effusion, or pneumothorax. IMPRESSION: Mild atelectasis at the left base. Electronically Signed   By: Marnee SpringJonathon  Watts M.D.   On: 07/14/2016 16:21   Ct Head Wo Contrast  Result Date: 07/14/2016 CLINICAL DATA:  Altered mental status, decreased level of consciousness. Abdominal pain. EXAM: CT HEAD WITHOUT CONTRAST TECHNIQUE: Contiguous axial images were obtained from the base of the skull through the vertex without intravenous contrast. COMPARISON:  Head CT dated 06/16/2016. FINDINGS: Brain: Mild generalized parenchymal atrophy with commensurate dilatation of the ventricles and sulci. Mild chronic small vessel ischemic changes within the bilateral periventricular white matter. Small old lacunar infarcts within the basal ganglia regions bilaterally. There is no mass, hemorrhage, edema or other evidence of acute parenchymal abnormality. No extra-axial hemorrhage. Vascular: No hyperdense vessel or unexpected calcification. Skull: Normal. Negative for fracture or focal lesion. Sinuses/Orbits: No acute finding. Other: None. IMPRESSION: 1. No acute findings.  No intracranial mass, hemorrhage or edema. 2. Chronic ischemic changes, as detailed above. Electronically Signed   By: Bary RichardStan  Maynard M.D.   On: 07/14/2016 16:09   [place] Scrotum  Result Date: 07/14/2016 CLINICAL DATA:  Swelling, evaluate for fluid collection or abscess EXAM: ULTRASOUND OF SCROTUM TECHNIQUE: Complete ultrasound examination of the testicles, epididymis, and other scrotal structures was performed. COMPARISON:  CT 07/14/2016, ultrasound 04/02/2016 FINDINGS: Right testicle Measurements: 3.5 x 1.3 x 2.4 cm. Several tiny benign-appearing cysts, the larger measures 0.3 x 0.2 x 0.3 cm. Left testicle Measurements: 4.1 x 1.7 x 4.2 cm. Several cysts again visualized. The largest measures 1.5 x 0.9 x 1.3 cm. Right epididymis: Multiple tiny cysts in the epididymal head. Simple cyst in the body measuring  1 x 0.3 x 0.8 cm. Mildly complex at the epididymal head measuring 2.1 x 1.1 x 1.5 cm, contains a small amount of internal debris. Left epididymis:  Normal in size and appearance. Hydrocele: Previously noted large left hydrocele has been drained. Nonspecific hypoechoic tissue visualized in the left scrotum. Varicocele:  None visualized. IMPRESSION: 1. Interim drainage of large left hydrocele. Nonspecific hypoechoic tissue remains within the left scrotum. No recurrent fluid collection is seen. 2. Benign-appearing intra testicular cysts 3. Mildly complex cyst in the right epididymal head, which contains a small amount of debris. Electronically Signed   By: Adrian ProwsKim  Fujinaga M.D.  On: 07/14/2016 21:54   Ct Renal Stone Study  Result Date: 07/14/2016 CLINICAL DATA:  Altered mental status pain in the abdomen increased weakness EXAM: CT ABDOMEN AND PELVIS WITHOUT CONTRAST TECHNIQUE: Multidetector CT imaging of the abdomen and pelvis was performed following the standard protocol without IV contrast. COMPARISON:  None. FINDINGS: Lower chest: Lung bases demonstrate linear atelectasis or scarring in the right middle lobe and lung bases. No consolidation or effusion. Heart size upper normal. Hepatobiliary: No biliary dilatation. No focal hepatic abnormality. Prominent gallbladder fundus. No calcified stones. Pancreas: Unremarkable. No pancreatic ductal dilatation or surrounding inflammatory changes. Spleen: Normal in size without focal abnormality. Adrenals/Urinary Tract: Adrenal glands are unremarkable. Kidneys are normal, without renal calculi, focal lesion, or hydronephrosis. Bladder is unremarkable. Stomach/Bowel: Stomach is within normal limits. Appendix appears normal. No evidence of bowel wall thickening, distention, or inflammatory changes. Extensive sigmoid colon diverticular disease without acute inflammation. Vascular/Lymphatic: Aortic atherosclerosis. No enlarged abdominal or pelvic lymph nodes. Reproductive:  Slightly enlarged prostate Other: No free air or free fluid.  Small fat in the umbilicus. Musculoskeletal: Degenerative changes of the spine. No acute or suspicious bone lesion. IMPRESSION: 1. No definite CT evidence for acute intra-abdominal or pelvic pathology. Negative for kidney stone, hydronephrosis or ureteral stone. 2. Extensive sigmoid colon diverticular disease without acute inflammation. Electronically Signed   By: Jasmine Pang M.D.   On: 07/14/2016 16:16     Scheduled Meds: . cloNIDine  0.1 mg Oral BID  . enoxaparin (LOVENOX) injection  30 mg Subcutaneous Q24H  . feeding supplement (ENSURE ENLIVE)  237 mL Oral BID BM  . pantoprazole  40 mg Oral Daily  . senna-docusate  1 tablet Oral BID   Continuous Infusions: . sodium chloride 125 mL/hr at 07/15/16 1841     LOS: 2 days   Time Spent in minutes   30 minutes  Shirell Struthers D.O. on 07/16/2016 at 10:59 AM  Between 7am to 7pm - Pager - [phone]  After 7pm go to [email or web address] - password [email or web address]  And look for the night coverage person covering for me after hours  [facility] Office  [phone]

## 2016-07-17 ENCOUNTER — Other Ambulatory Visit: Payer: Medicare Other

## 2016-07-17 DIAGNOSIS — E43 Unspecified severe protein-calorie malnutrition: Secondary | ICD-10-CM

## 2016-07-17 LAB — BASIC METABOLIC PANEL
Anion gap: 9 (ref 5–15)
BUN: 26 mg/dL — ABNORMAL HIGH (ref 6–20)
CO2: 23 mmol/L (ref 22–32)
Calcium: 8.8 mg/dL — ABNORMAL LOW (ref 8.9–10.3)
Chloride: 111 mmol/L (ref 101–111)
Creatinine, Ser: 1.39 mg/dL — ABNORMAL HIGH (ref 0.61–1.24)
GFR calc Af Amer: 57 mL/min — ABNORMAL LOW (ref >60)
GFR calc non Af Amer: 49 mL/min — ABNORMAL LOW (ref >60)
Glucose, Bld: 78 mg/dL (ref 65–99)
Potassium: 3.7 mmol/L (ref 3.5–5.1)
Sodium: 143 mmol/L (ref 135–145)

## 2016-07-17 MED ORDER — ENSURE ENLIVE PO LIQD: 237.0000 mL | Freq: Two times a day (BID) | ORAL | 0 refills | Status: DC

## 2016-07-17 NOTE — Discharge Summary (Signed)
Physician Discharge Summary  Andrew Cuevas XBJ:[id number] DOB: Mar 12, 1945 DOA: 07/14/2016  PCP: Sharlene Dory, DO  Admit date: 07/14/2016 Discharge date: 07/17/2016  Time spent: 45 minutes  Recommendations for Outpatient Follow-up:  Patient will be discharged to home.  Patient will need to follow up with primary care provider within one week of discharge, discuss blood pressure management and repeat BMP.  Patient should continue medications as prescribed.  Patient should follow a heart healthy diet.   Discharge Diagnoses:  Acute kidney injury on chronic kidney disease, stage III Failure to thrive with decreased oral intake/Severe malnutrition Leukocytosis  History of cocaine use Essential hypertension Hydrocele Mildly elevated troponin  Discharge Condition: Stable  Diet recommendation: heart healthy  Filed Weights   07/15/16 0128  Weight: 76.9 kg (169 lb 8.5 oz)    History of present illness:  on 07/14/2016 by Dr. Toula Moos Cuevas a 71 y.o.malewith medical history significant of HTN and CVA presenting because he "feels sick." He is unable to provide additional history and is unaccompanied. ER notes also report AMS, confusion. His brother reported minimal PO intake and increased weakness. His sister reported FTT since his wife died a year ago and the patient was moved from [place] to [facility] and thinks he is depressed. He had a left hydrocelectomy on 5/3. His labs were abnormal on 5/10 (creatinine up from 1.26 to 2.91) and so chlorthalidone was stopped.  Hospital Course:  Acute kidney injury on chronic kidney disease, stage III -Patient status post left hydrocelectomy on 07/02/2016, creatinine noted to be 2.91 on 07/09/2016. Chlorthalidone was discontinued at that time -suspect secondary to medications, patient was on losartan upon admission -Patient's baseline creatinine 73.0 -Upon admission, creatinine 4.74 -Was placed on IVF -Creatinine  currently 1.39 -Renal CT unremarkable -UA unremarkable for infection -Avoid nephrotoxic agents -Repeat BMP in one week  Failure to thrive with decreased oral intake/Severe malnutrition -Possibly due to depression vs decreased Dysgeusia (patient complained of feeling his taste buds feeling different) -Nutrition consulted and recommended supplements -patient feels his taste buds are better today (?due to AKI)  Leukocytosis  -Resolved, WBC was 13.3 on admission -patient did receive 1 dose of vancomycin and Zosyn in the emergency department -Patient currently afebrile with no source of infection (UA and CXR unremarkable)  History of cocaine use -UDS negative  Essential hypertension -Continue Catapres and amlodipine -Losartan and amlodipine currently held -BP has remained stable without the addition of other medications -Follow up with PCP to discuss BP management. Would avoid diuretics and ARB/ACEi if possible.   Hydrocele -As above, patient recently status post hydrocelectomy -Scrotal ultrasound unremarkable  Mildly elevated troponin -In the setting of renal failure -No complaints of chest pain -Troponin negative x 2 (mildly elevated at 0.04 POC)  Consultants None  Procedures  Scrotal US  Discharge Exam: Vitals:   07/16/16 2225 07/17/16 0532  BP: 127/85 (!) 136/93  Pulse: (!) 102 (!) 51  Resp: 16 20  Temp: 98.1 F (36.7 C) 98.5 F (36.9 C)   Patient is feeling better today. Denies chest pain, shortness of breath, nausea, or vomiting. Has mild lower abdominal pain, but feels it is related to gas. Feels taste buds are improving    General: Well developed, well nourished, NAD, appears stated age  HEENT: NCAT, mucous membranes moist.  Cardiovascular: S1 S2 auscultated, RRR, no murmurs  Respiratory: Clear to auscultation bilaterally with equal chest rise  Abdomen: Soft, nontender, nondistended, + bowel sounds  Extremities: warm dry without cyanosis  clubbing or edema  Neuro: AAOx3, nonfocal  Psych: Normal affect and demeanor with intact judgement and insight  Discharge Instructions Discharge Instructions    Discharge instructions    Complete by:  As directed    Patient will be discharged to home.  Patient will need to follow up with primary care provider within one week of discharge, discuss blood pressure management and repeat BMP.  Patient should continue medications as prescribed.  Patient should follow a heart healthy diet.     Current Discharge Medication List    START taking these medications   Details  feeding supplement, ENSURE ENLIVE, (ENSURE ENLIVE) LIQD Take 237 mLs by mouth 2 (two) times daily between meals. Qty: 60 Bottle, Refills: 0      CONTINUE these medications which have NOT CHANGED   Details  amLODipine (NORVASC) 5 MG tablet Take 1 tablet (5 mg total) by mouth daily. Qty: 90 tablet, Refills: 3   Associated Diagnoses: Essential hypertension    cloNIDine (CATAPRES) 0.1 MG tablet Take 1 tablet (0.1 mg total) by mouth 2 (two) times daily. Qty: 60 tablet, Refills: 0    HYDROcodone-acetaminophen (NORCO) 5-325 MG tablet Take 1 tablet by mouth every 6 (six) hours as needed for moderate pain. Qty: 10 tablet, Refills: 0    omeprazole (PRILOSEC) 20 MG capsule Take 1 capsule (20 mg total) by mouth 2 (two) times daily before a meal. Qty: 180 capsule, Refills: 1   Associated Diagnoses: Gastroesophageal reflux disease, esophagitis presence not specified    senna-docusate (SENOKOT-S) 8.6-50 MG tablet Take 1 tablet by mouth 2 (two) times daily. Qty: 180 tablet, Refills: 1   Associated Diagnoses: Constipation, unspecified constipation type      STOP taking these medications     losartan (COZAAR) 100 MG tablet      chlorthalidone (HYGROTON) 25 MG tablet        No Known Allergies Follow-up Information    Sharlene DoryWendling, Nicholas Paul, DO. Schedule an appointment as soon as possible for a visit in 1 week(s).     Specialty:  Family Medicine Why:  Hospital follow up, discuss blood pressure and repeat labs Contact information: [address] [place] [id number] [phone]            The results of significant diagnostics from this hospitalization (including imaging, microbiology, ancillary and laboratory) are listed below for reference.    Significant Diagnostic Studies: Dg Chest 2 View  Result Date: 07/14/2016 CLINICAL DATA:  Altered mental status EXAM: CHEST  2 VIEW COMPARISON:  04/14/2016 FINDINGS: Normal heart size. Stable aortic tortuosity. Mild streaky density at the left base. No edema, air bronchogram, effusion, or pneumothorax. IMPRESSION: Mild atelectasis at the left base. Electronically Signed   By: Marnee SpringJonathon  Watts M.D.   On: 07/14/2016 16:21   Ct Head Wo Contrast  Result Date: 07/14/2016 CLINICAL DATA:  Altered mental status, decreased level of consciousness. Abdominal pain. EXAM: CT HEAD WITHOUT CONTRAST TECHNIQUE: Contiguous axial images were obtained from the base of the skull through the vertex without intravenous contrast. COMPARISON:  Head CT dated 06/16/2016. FINDINGS: Brain: Mild generalized parenchymal atrophy with commensurate dilatation of the ventricles and sulci. Mild chronic small vessel ischemic changes within the bilateral periventricular white matter. Small old lacunar infarcts within the basal ganglia regions bilaterally. There is no mass, hemorrhage, edema or other evidence of acute parenchymal abnormality. No extra-axial hemorrhage. Vascular: No hyperdense vessel or unexpected calcification. Skull: Normal. Negative for fracture or focal lesion. Sinuses/Orbits: No acute  finding. Other: None. IMPRESSION: 1. No acute findings.  No intracranial mass, hemorrhage or edema. 2. Chronic ischemic changes, as detailed above. Electronically Signed   By: Bary Richard M.D.   On: 07/14/2016 16:09   US Scrotum  Result Date: 07/14/2016 CLINICAL DATA:  Swelling,  evaluate for fluid collection or abscess EXAM: ULTRASOUND OF SCROTUM TECHNIQUE: Complete ultrasound examination of the testicles, epididymis, and other scrotal structures was performed. COMPARISON:  CT 07/14/2016, ultrasound 04/02/2016 FINDINGS: Right testicle Measurements: 3.5 x 1.3 x 2.4 cm. Several tiny benign-appearing cysts, the larger measures 0.3 x 0.2 x 0.3 cm. Left testicle Measurements: 4.1 x 1.7 x 4.2 cm. Several cysts again visualized. The largest measures 1.5 x 0.9 x 1.3 cm. Right epididymis: Multiple tiny cysts in the epididymal head. Simple cyst in the body measuring 1 x 0.3 x 0.8 cm. Mildly complex at the epididymal head measuring 2.1 x 1.1 x 1.5 cm, contains a small amount of internal debris. Left epididymis:  Normal in size and appearance. Hydrocele: Previously noted large left hydrocele has been drained. Nonspecific hypoechoic tissue visualized in the left scrotum. Varicocele:  None visualized. IMPRESSION: 1. Interim drainage of large left hydrocele. Nonspecific hypoechoic tissue remains within the left scrotum. No recurrent fluid collection is seen. 2. Benign-appearing intra testicular cysts 3. Mildly complex cyst in the right epididymal head, which contains a small amount of debris. Electronically Signed   By: Jasmine Pang M.D.   On: 07/14/2016 21:54   Ct Renal Stone Study  Result Date: 07/14/2016 CLINICAL DATA:  Altered mental status pain in the abdomen increased weakness EXAM: CT ABDOMEN AND PELVIS WITHOUT CONTRAST TECHNIQUE: Multidetector CT imaging of the abdomen and pelvis was performed following the standard protocol without IV contrast. COMPARISON:  None. FINDINGS: Lower chest: Lung bases demonstrate linear atelectasis or scarring in the right middle lobe and lung bases. No consolidation or effusion. Heart size upper normal. Hepatobiliary: No biliary dilatation. No focal hepatic abnormality. Prominent gallbladder fundus. No calcified stones. Pancreas: Unremarkable. No pancreatic  ductal dilatation or surrounding inflammatory changes. Spleen: Normal in size without focal abnormality. Adrenals/Urinary Tract: Adrenal glands are unremarkable. Kidneys are normal, without renal calculi, focal lesion, or hydronephrosis. Bladder is unremarkable. Stomach/Bowel: Stomach is within normal limits. Appendix appears normal. No evidence of bowel wall thickening, distention, or inflammatory changes. Extensive sigmoid colon diverticular disease without acute inflammation. Vascular/Lymphatic: Aortic atherosclerosis. No enlarged abdominal or pelvic lymph nodes. Reproductive: Slightly enlarged prostate Other: No free air or free fluid.  Small fat in the umbilicus. Musculoskeletal: Degenerative changes of the spine. No acute or suspicious bone lesion. IMPRESSION: 1. No definite CT evidence for acute intra-abdominal or pelvic pathology. Negative for kidney stone, hydronephrosis or ureteral stone. 2. Extensive sigmoid colon diverticular disease without acute inflammation. Electronically Signed   By: Jasmine Pang M.D.   On: 07/14/2016 16:16    Microbiology: Recent Results (from the past 240 hour(s))  Blood culture (routine x 2)     Status: None (Preliminary result)   Collection Time: 07/14/16  7:16 PM  Result Value Ref Range Status   Specimen Description BLOOD LEFT ANTECUBITAL  Final   Special Requests   Final    BOTTLES DRAWN AEROBIC AND ANAEROBIC Blood Culture adequate volume   Culture   Final    NO GROWTH 2 DAYS Performed at [facility], [address], [place], [place] [address]    Report Status PENDING  Incomplete  Blood culture (routine x 2)     Status: None (  Preliminary result)   Collection Time: 07/14/16 10:37 PM  Result Value Ref Range Status   Specimen Description BLOOD RIGHT HAND  Final   Special Requests   Final    BOTTLES DRAWN AEROBIC AND ANAEROBIC Blood Culture results may not be optimal due to an inadequate volume of blood received in culture bottles   Culture   Final     NO GROWTH 1 DAY Performed at [facility], [address], [place], [place] [address]    Report Status PENDING  Incomplete     Labs: Basic Metabolic Panel:  Recent Labs Lab 07/14/16 1532 07/15/16 0305 07/16/16 0335 07/17/16 0348  NA 141 144 143 143  K 4.2 3.9 4.1 3.7  CL 108 112* 113* 111  CO2 21* 22 21* 23  GLUCOSE 126* 101* 83 78  BUN 64* 61* 41* 26*  CREATININE 4.74* 3.50* 2.02* 1.39*  CALCIUM 10.0 9.2 8.8* 8.8*   Liver Function Tests:  Recent Labs Lab 07/14/16 1532  AST 24  ALT 17  ALKPHOS 71  BILITOT 0.9  PROT 9.7*  ALBUMIN 4.1    Recent Labs Lab 07/14/16 1532  LIPASE 18   No results for input(s): AMMONIA in the last 168 hours. CBC:  Recent Labs Lab 07/14/16 1532 07/15/16 0305  WBC 13.3* 9.7  NEUTROABS 10.8*  --   HGB 14.2 11.7*  HCT 43.2 36.2*  MCV 81.2 82.1  PLT 316 246   Cardiac Enzymes:  Recent Labs Lab 07/14/16 1532 07/15/16 0305 07/15/16 0818 07/15/16 1305  TROPONINI 0.04* 0.03* <0.03 <0.03   BNP: BNP (last 3 results) No results for input(s): BNP in the last 8760 hours.  ProBNP (last 3 results) No results for input(s): PROBNP in the last 8760 hours.  CBG: No results for input(s): GLUCAP in the last 168 hours.     SignedEdsel Petrin  [facility] 07/17/2016, 9:44 AM

## 2016-07-17 NOTE — Progress Notes (Signed)
This nurse spoke to Andrew Cuevas,pats. sister and discussed discharge instructions over the phone. Sister will not pick up patient,she verbalized understanding re: meds and answered questions appropriately. Awaiting for brother to transport him home.

## 2016-07-17 NOTE — Progress Notes (Signed)
Patient discharge home. Stable.  

## 2016-07-17 NOTE — Progress Notes (Signed)
Discharge instructions given to pts. Brother.Informed about medications and follow up appointments, understood d/c.Patient is comfortable.

## 2016-07-17 NOTE — Discharge Instructions (Signed)
Acute Kidney Injury, Adult Acute kidney injury is a sudden worsening of kidney function. The kidneys are organs that have several jobs. They filter the blood to remove waste products and extra fluid. They also maintain a healthy balance of minerals and hormones in the body, which helps control blood pressure and keep bones strong. With this condition, your kidneys do not do their jobs as well as they should. This condition ranges from mild to severe. Over time it may develop into long-lasting (chronic) kidney disease. Early detection and treatment may prevent acute kidney injury from developing into a chronic condition. What are the causes? Common causes of this condition include:  A problem with blood flow to the kidneys. This may be caused by:  Low blood pressure (hypotension) or shock.  Blood loss.  Heart and blood vessel (cardiovascular) disease.  Severe burns.  Liver disease.  Direct damage to the kidneys. This may be caused by:  Certain medicines.  A kidney infection.  Poisoning.  Being around or in contact with toxic substances.  A surgical wound.  A hard, direct hit to the kidney area.  A sudden blockage of urine flow. This may be caused by:  Cancer.  Kidney stones.  An enlarged prostate in males. What are the signs or symptoms? Symptoms of this condition may not be obvious until the condition becomes severe. Symptoms of this condition can include:  Tiredness (lethargy), or difficulty staying awake.  Nausea or vomiting.  Swelling (edema) of the face, legs, ankles, or feet.  Problems with urination, such as:  Abdominal pain, or pain along the side of your stomach (flank).  Decreased urine production.  Decrease in the force of urine flow.  Muscle twitches and cramps, especially in the legs.  Confusion or trouble concentrating.  Loss of appetite.  Fever. How is this diagnosed? This condition may be diagnosed with tests, including:  Blood  tests.  Urine tests.  Imaging tests.  A test in which a sample of tissue is removed from the kidneys to be examined under a microscope (kidney biopsy). How is this treated? Treatment for this condition depends on the cause and how severe the condition is. In mild cases, treatment may not be needed. The kidneys may heal on their own. In more severe cases, treatment will involve:  Treating the cause of the kidney injury. This may involve changing any medicines you are taking or adjusting your dosage.  Fluids. You may need specialized IV fluids to balance your body's needs.  Having a catheter placed to drain urine and prevent blockages.  Preventing problems from occurring. This may mean avoiding certain medicines or procedures that can cause further injury to the kidneys. In some cases treatment may also require:  A procedure to remove toxic wastes from the body (dialysis or continuous renal replacement therapy - CRRT).  Surgery. This may be done to repair a torn kidney, or to remove the blockage from the urinary system. Follow these instructions at home: Medicines   Take over-the-counter and prescription medicines only as told by your health care provider.  Do not take any new medicines without your health care provider's approval. Many medicines can worsen your kidney damage.  Do not take any vitamin and mineral supplements without your health care provider's approval. Many nutritional supplements can worsen your kidney damage. Lifestyle   If your health care provider prescribed changes to your diet, follow them. You may need to decrease the amount of protein you eat.  Achieve and maintain a   healthy weight. If you need help with this, ask your health care provider.  Start or continue an exercise plan. Try to exercise at least 30 minutes a day, 5 days a week.  Do not use any tobacco products, such as cigarettes, chewing tobacco, and e-cigarettes. If you need help quitting, ask  your health care provider. General instructions   Keep track of your blood pressure. Report changes in your blood pressure as told by your health care provider.  Stay up to date with immunizations. Ask your health care provider which immunizations you need.  Keep all follow-up visits as told by your health care provider. This is important. Where to find more information:  American Association of Kidney Patients: www.aakp.org  National Kidney Foundation: www.kidney.org  American Kidney Fund: www.akfinc.org  Life Options Rehabilitation Program:  www.lifeoptions.org  www.kidneyschool.org Contact a health care provider if:  Your symptoms get worse.  You develop new symptoms. Get help right away if:  You develop symptoms of worsening kidney disease, which include:  Headaches.  Abnormally dark or light skin.  Easy bruising.  Frequent hiccups.  Chest pain.  Shortness of breath.  End of menstruation in women.  Seizures.  Confusion or altered mental status.  Abdominal or back pain.  Itchiness.  You have a fever.  Your body is producing less urine.  You have pain or bleeding when you urinate. Summary  Acute kidney injury is a sudden worsening of kidney function.  Acute kidney injury can be caused by problems with blood flow to the kidneys, direct damage to the kidneys, and sudden blockage of urine flow.  Symptoms of this condition may not be obvious until it becomes severe. Symptoms may include edema, lethargy, confusion, nausea or vomiting, and problems passing urine.  This condition can usually be diagnosed with blood tests, urine tests, and imaging tests. Sometimes a kidney biopsy is done to diagnose this condition.  Treatment for this condition often involves treating the underlying cause. It is treated with fluids, medicines, dialysis, diet changes, or surgery. This information is not intended to replace advice given to you by your health care provider.  Make sure you discuss any questions you have with your health care provider. Document Released: 09/01/2010 Document Revised: 02/07/2016 Document Reviewed: 02/07/2016 Elsevier Interactive Patient Education  2017 [employer] Inc.  

## 2016-07-19 LAB — CULTURE, BLOOD (ROUTINE X 2)
Culture: NO GROWTH
Special Requests: ADEQUATE

## 2016-07-20 ENCOUNTER — Telehealth: Payer: Self-pay | Admitting: Family

## 2016-07-20 LAB — CULTURE, BLOOD (ROUTINE X 2): CULTURE: NO GROWTH

## 2016-07-20 NOTE — Telephone Encounter (Signed)
Please contact patient to arrange TCM follow up with Dr. Carmelia RollerWendling.

## 2016-07-20 NOTE — Telephone Encounter (Signed)
Transition Care Management Follow-up Telephone Call  PCP: Sharlene DoryWendling, Nicholas Paul, DO  Admit date: 07/14/2016 Discharge date: 07/17/2016   Recommendations for Outpatient Follow-up:  Patient will be discharged to home.  Patient will need to follow up with primary care provider within one week of discharge, discuss blood pressure management and repeat BMP.  Patient should continue medications as prescribed.  Patient should follow a heart healthy diet.   Discharge Diagnoses:  Acute kidney injury on chronic kidney disease, stage III Failure to thrive with decreased oral intake/Severe malnutrition Leukocytosis  History of cocaine use Essential hypertension Hydrocele Mildly elevated troponin  Discharge Condition: Stable   How have you been since you were released from the hospital? Per patient's sister Alona Bene([name] Bonsall), "He's doing better, but not eating very much".   Do you understand why you were in the hospital? yes   Do you understand the discharge instructions? yes   Where were you discharged to? Home with sister.   Items Reviewed:  Medications reviewed: yes  Allergies reviewed: NKA  Dietary changes reviewed: yes, heart healthy  Referrals reviewed: yes, Patient will need to follow up with primary care provider within one week of discharge, discuss blood pressure management and repeat BMP.    Functional Questionnaire:   Activities of Daily Living (ADLs):   He states they are independent in the following: ambulation, bathing and hygiene, feeding, continence, grooming, toileting and dressing States they require assistance with the following: None   Any transportation issues/concerns?: no   Any patient concerns? no   Confirmed importance and date/time of follow-up visits scheduled yes, 07/31/16 at 11:15 AM  Provider Appointment booked with Dr. Carmelia RollerWendling.  Confirmed with patient if condition begins to worsen call PCP or go to the ER.  Patient was given the office  number and encouraged to call back with question or concerns.  : yes

## 2016-07-31 ENCOUNTER — Ambulatory Visit (INDEPENDENT_AMBULATORY_CARE_PROVIDER_SITE_OTHER): Payer: Medicare Other | Admitting: Family Medicine

## 2016-07-31 ENCOUNTER — Encounter: Payer: Self-pay | Admitting: Family Medicine

## 2016-07-31 ENCOUNTER — Encounter: Payer: Self-pay | Admitting: Physician Assistant

## 2016-07-31 VITALS — BP 100/70 | HR 83 | Temp 97.8°F | Ht 70.0 in | Wt 170.2 lb

## 2016-07-31 DIAGNOSIS — Z09 Encounter for follow-up examination after completed treatment for conditions other than malignant neoplasm: Secondary | ICD-10-CM

## 2016-07-31 DIAGNOSIS — Z1211 Encounter for screening for malignant neoplasm of colon: Secondary | ICD-10-CM

## 2016-07-31 DIAGNOSIS — R627 Adult failure to thrive: Secondary | ICD-10-CM | POA: Diagnosis not present

## 2016-07-31 LAB — COMPREHENSIVE METABOLIC PANEL
ALT: 20 U/L (ref 0–53)
AST: 25 U/L (ref 0–37)
Albumin: 4.2 g/dL (ref 3.5–5.2)
Alkaline Phosphatase: 68 U/L (ref 39–117)
BUN: 24 mg/dL — AB (ref 6–23)
CHLORIDE: 106 meq/L (ref 96–112)
CO2: 25 mEq/L (ref 19–32)
Calcium: 9.8 mg/dL (ref 8.4–10.5)
Creatinine, Ser: 1.74 mg/dL — ABNORMAL HIGH (ref 0.40–1.50)
GFR: 49.71 mL/min — ABNORMAL LOW (ref >60.00)
GLUCOSE: 83 mg/dL (ref 70–99)
POTASSIUM: 3.9 meq/L (ref 3.5–5.1)
SODIUM: 139 meq/L (ref 135–145)
Total Bilirubin: 0.6 mg/dL (ref 0.2–1.2)
Total Protein: 8.1 g/dL (ref 6.0–8.3)

## 2016-07-31 LAB — CBC
HEMATOCRIT: 39.4 % (ref 39.0–52.0)
HEMOGLOBIN: 12.4 g/dL — AB (ref 13.0–17.0)
MCHC: 31.5 g/dL (ref 30.0–36.0)
MCV: 83 fl (ref 78.0–100.0)
Platelets: 176 10*3/uL (ref 150.0–400.0)
RBC: 4.75 Mil/uL (ref 4.22–5.81)
RDW: 16.1 % — AB (ref 11.5–15.5)
WBC: 2.8 10*3/uL — ABNORMAL LOW (ref 4.0–10.5)

## 2016-07-31 NOTE — Patient Instructions (Signed)
Call us weekly with weight. If we continue to lose weight, I will send you to a nutritionist.  If you do not hear anything about your referral in the next 1-2 weeks, call our office and ask for an update.

## 2016-07-31 NOTE — Progress Notes (Signed)
Chief Complaint  Patient presents with  . Hospitalization Follow-up    pt states doing better    HPI Andrew Cuevas is a 71 y.o. y.o. male who presents for a transition of care visit.  Pt was discharged from [facility] on 07/17/16.  Within 48 business hours of discharge our office contacted him via telephone to coordinate his care and needs. Here today with brother.  Patient admitted for acute renal injury, severe malnutrition, and leukocytosis. She did receive antibiotics in the emergency department, however is not found to have an infective process going on and no further antibiotics were ordered. Leukocytosis resolved by discharge.  Patient reports he is feeling much better since being discharge. He was evaluated by the nutrition team and started on Ensure supplementation. He will eat about 1-1/2 meals per day in addition to his supplementation. He is not drinking as much water as normal either. He is still making urine and states it is relatively clear.  The patient is tolerating his blood pressure medicine well.  The patient states he has never had a colonoscopy.  The patient tells me that he does not feel depressed or anxious.  Social History   Social History  . Marital status: Single   Social History Main Topics  . Smoking status: Former Smoker    Packs/day: 0.50    Types: Cigarettes    Quit date: 04/27/2016  . Smokeless tobacco: Never Used  . Alcohol use No  . Drug use: No   Social History Narrative   Lives with sister.    Past Medical History:  Diagnosis Date  . Chronic headaches   . CVA (cerebral vascular accident) (HCC)   . HTN (hypertension), malignant    Family History  Problem Relation Age of Onset  . Hypertension Mother   . Arthritis Mother   . Alzheimer's disease Father   . Stroke Sister    Allergies as of 07/31/2016   No Known Allergies     Medication List       Accurate as of 07/31/16 12:47 PM. Always use your most recent med list.          amLODipine 5  MG tablet Commonly known as:  NORVASC Take 1 tablet (5 mg total) by mouth daily.   cloNIDine 0.1 MG tablet Commonly known as:  CATAPRES Take 1 tablet (0.1 mg total) by mouth 2 (two) times daily.   feeding supplement (ENSURE ENLIVE) Liqd Take 237 mLs by mouth 2 (two) times daily between meals.   omeprazole 20 MG capsule Commonly known as:  PRILOSEC Take 1 capsule (20 mg total) by mouth 2 (two) times daily before a meal.   senna-docusate 8.6-50 MG tablet Commonly known as:  Senokot-S Take 1 tablet by mouth 2 (two) times daily.       ROS:  Constitutional: No fevers or chills, no weight loss HEENT: No headaches, hearing loss, or runny nose, no sore throat Heart: No chest pain Lungs: No SOB, no cough Abd: No bowel changes, no pain, no N/V GU: No urinary complaints Neuro: No numbness, tingling or weakness Msk: No joint or muscle pain  Objective BP 100/70 (BP Location: Left Arm, Patient Position: Sitting, Cuff Size: Normal)   Pulse 83   Temp 97.8 F (36.6 C) (Oral)   Ht 5\' 10"  (1.778 m)   Wt 170 lb 3.2 oz (77.2 kg)   SpO2 98%   BMI 24.42 kg/m  General Appearance:  awake, alert, oriented, in no acute distress and well developed, well  nourished Skin:  No diaphoresis or erythema Head/face:  NCAT Eyes:  EOMI, PERRLA Ears:  canals and TMs NI Nose/Sinuses:  negative Mouth/Throat:  Mucosa moist, no lesions; pharynx without erythema, edema or exudate. Neck:  neck- supple, no mass, non-tender and no jvd Lungs: Clear to auscultation.  No rales, rhonchi, or wheezing. Normal effort, no accessory muscle use. Heart:  Heart sounds are normal.  Regular rate and rhythm without murmur, gallop or rub. No bruits. Abdomen:  BS+, soft, NT, ND, no masses or organomegaly Musculoskeletal:  No muscle group atrophy or asymmetry, gait slow Neurologic:  Alert and oriented x 3, gait normal, strength and  sensation grossly normal Psych exam: Nml mood and affect  Hospital discharge follow-up -  Plan: Comprehensive metabolic panel, CBC  Failure to thrive in adult  Screen for colon cancer - Plan: Ambulatory referral to Gastroenterology  Discharge summary and medication list have been reviewed/reconciled.  Labs pending at the time of discharge have been reviewed or are still pending at the time of this visit.  Follow-up labs and appointments have been ordered and/or coordinated appropriately. Educational materials regarding the patient's admitting diagnosis provided.  TRANSITIONAL CARE MANAGEMENT CERTIFICATION:  I certify the following are true:   1. Communication with the patient/care giver was made within 2 business days of discharge.  2. Complexity of Medical decision making is moderate.  3. Face to face visit occurred within 14 days of discharge.   Weekly weights, will call in, until I see him next. If he continues to lose weight, will refer to nutrition on outpatient basis. Will see if he continues to improve with mobility and reassess PT need. F/u in 6 weeks to reevaluate BP and weight loss. The patient and brother voiced understanding and agreement to the plan.  Andrew Cuevas CreeksideWendling, DO 07/31/16 12:47 PM

## 2016-08-06 ENCOUNTER — Encounter: Payer: Medicare Other | Admitting: Family Medicine

## 2016-08-06 ENCOUNTER — Other Ambulatory Visit (INDEPENDENT_AMBULATORY_CARE_PROVIDER_SITE_OTHER): Payer: Medicare Other

## 2016-08-06 ENCOUNTER — Ambulatory Visit: Payer: Medicare Other | Admitting: Physician Assistant

## 2016-08-06 DIAGNOSIS — R7989 Other specified abnormal findings of blood chemistry: Secondary | ICD-10-CM | POA: Diagnosis not present

## 2016-08-06 DIAGNOSIS — D72819 Decreased white blood cell count, unspecified: Secondary | ICD-10-CM

## 2016-08-06 LAB — CBC WITH DIFFERENTIAL/PLATELET
BASOS PCT: 0.7 % (ref 0.0–3.0)
Basophils Absolute: 0 10*3/uL (ref 0.0–0.1)
EOS ABS: 0.1 10*3/uL (ref 0.0–0.7)
Eosinophils Relative: 1.8 % (ref 0.0–5.0)
HCT: 36.7 % — ABNORMAL LOW (ref 39.0–52.0)
HEMOGLOBIN: 11.9 g/dL — AB (ref 13.0–17.0)
LYMPHS ABS: 2 10*3/uL (ref 0.7–4.0)
Lymphocytes Relative: 62 % — ABNORMAL HIGH (ref 12.0–46.0)
MCHC: 32.4 g/dL (ref 30.0–36.0)
MCV: 83 fl (ref 78.0–100.0)
MONO ABS: 0.4 10*3/uL (ref 0.1–1.0)
Monocytes Relative: 11.6 % (ref 3.0–12.0)
NEUTROS PCT: 23.9 % — AB (ref 43.0–77.0)
Neutro Abs: 0.8 10*3/uL — ABNORMAL LOW (ref 1.4–7.7)
Platelets: 162 10*3/uL (ref 150.0–400.0)
RBC: 4.43 Mil/uL (ref 4.22–5.81)
RDW: 15.8 % — ABNORMAL HIGH (ref 11.5–15.5)
WBC: 3.1 10*3/uL — AB (ref 4.0–10.5)

## 2016-08-06 LAB — BASIC METABOLIC PANEL
BUN: 22 mg/dL (ref 6–23)
CALCIUM: 9.8 mg/dL (ref 8.4–10.5)
CO2: 24 mEq/L (ref 19–32)
Chloride: 102 mEq/L (ref 96–112)
Creatinine, Ser: 1.72 mg/dL — ABNORMAL HIGH (ref 0.40–1.50)
GFR: 50.37 mL/min — AB (ref >60.00)
GLUCOSE: 86 mg/dL (ref 70–99)
POTASSIUM: 3.9 meq/L (ref 3.5–5.1)
SODIUM: 135 meq/L (ref 135–145)

## 2016-08-07 NOTE — Progress Notes (Signed)
Error, labs not OV.

## 2016-08-18 ENCOUNTER — Telehealth: Payer: Self-pay | Admitting: Family Medicine

## 2016-08-18 ENCOUNTER — Encounter: Payer: Self-pay | Admitting: Gastroenterology

## 2016-08-18 ENCOUNTER — Ambulatory Visit (INDEPENDENT_AMBULATORY_CARE_PROVIDER_SITE_OTHER): Payer: Medicare Other | Admitting: Gastroenterology

## 2016-08-18 ENCOUNTER — Encounter (INDEPENDENT_AMBULATORY_CARE_PROVIDER_SITE_OTHER): Payer: Self-pay

## 2016-08-18 VITALS — BP 140/70 | HR 95 | Ht 70.0 in | Wt 168.0 lb

## 2016-08-18 DIAGNOSIS — Z1211 Encounter for screening for malignant neoplasm of colon: Secondary | ICD-10-CM | POA: Diagnosis not present

## 2016-08-18 NOTE — Patient Instructions (Signed)
You have been scheduled for a colonoscopy. Please follow written instructions given to you at your visit today.  Please pick up your prep supplies at the pharmacy within the next 1-3 days. If you use inhalers (even only as needed), please bring them with you on the day of your procedure. Your physician has requested that you go to www.startemmi.com and enter the access code given to you at your visit today. This web site gives a general overview about your procedure. However, you should still follow specific instructions given to you by our office regarding your preparation for the procedure.  If you are age 73 or older, your body mass index should be between 23-30. Your Body mass index is 24.11 kg/m. If this is out of the aforementioned range listed, please consider follow up with your Primary Care Provider.  If you are age 73 or younger, your body mass index should be between 19-25. Your Body mass index is 24.11 kg/m. If this is out of the aformentioned range listed, please consider follow up with your Primary Care Provider.

## 2016-08-18 NOTE — Progress Notes (Addendum)
     08/18/2016 Andrew AbbottJohn Cuevas [id number] 1945/06/06   HISTORY OF PRESENT ILLNESS:  Patient is here for screening colonoscopy.  Never had one in the past.  No GI complaints.  Had a hypertensive CVA in 04/2016 but has been recovering well from that other than some difficulty with ambulation; uses a cane.  Past Medical History:  Diagnosis Date  . Chronic headaches   . CVA (cerebral vascular accident) (HCC)   . HTN (hypertension), malignant    Past Surgical History:  Procedure Laterality Date  . HERNIA REPAIR Bilateral   . HYDROCELE EXCISION Left 07/02/2016   Procedure: HYDROCELECTOMY ADULT;  Surgeon: Andrew PippinJohn Wrenn, MD;  Location: [facility];  Service: Urology;  Laterality: Left;    reports that he quit smoking about 3 months ago. His smoking use included Cigarettes. He smoked 0.50 packs per day. He has never used smokeless tobacco. He reports that he does not drink alcohol or use drugs. family history includes Alzheimer's disease in his father; Arthritis in his mother; Hypertension in his mother; Stroke in his sister. No Known Allergies    Outpatient Encounter Prescriptions as of 08/18/2016  Medication Sig  . amLODipine (NORVASC) 5 MG tablet Take 1 tablet (5 mg total) by mouth daily.  . cloNIDine (CATAPRES) 0.1 MG tablet Take 1 tablet (0.1 mg total) by mouth 2 (two) times daily.  . feeding supplement, ENSURE ENLIVE, (ENSURE ENLIVE) LIQD Take 237 mLs by mouth 2 (two) times daily between meals.  Marland Kitchen. omeprazole (PRILOSEC) 20 MG capsule Take 1 capsule (20 mg total) by mouth 2 (two) times daily before a meal.  . senna-docusate (SENOKOT-S) 8.6-50 MG tablet Take 1 tablet by mouth 2 (two) times daily.   No facility-administered encounter medications on file as of 08/18/2016.      REVIEW OF SYSTEMS  : All other systems reviewed and negative except where noted in the History of Present Illness.   PHYSICAL EXAM: BP 140/70   Pulse 95   Ht 5\' 10"  (1.778 m)   Wt 168 lb (76.2 kg)   SpO2 98%   BMI 24.11  kg/m  General: Well developed black male in no acute distress; ambulates with a cane. Head: Normocephalic and atraumatic Eyes:  Sclerae anicteric, conjunctiva pink. Ears: Normal auditory acuity Lungs: Clear throughout to auscultation; no increased WOB. Heart: Regular rate and rhythm Abdomen: Soft, non-distended.  Normal bowel sounds.  Non-tender. Rectal:  Will be done at the time of colonoscopy. Musculoskeletal: Symmetrical with no gross deformities  Skin: No lesions on visible extremities Extremities: No edema  Neurological: Alert oriented x 4, grossly non-focal Psychological:  Alert and cooperative. Normal mood and affect  ASSESSMENT AND PLAN: -Screening colonoscopy:  Will schedule with Dr. Leone Cuevas.  The risks, benefits, and alternatives to colonoscopy were discussed with the patient and he consents to proceed. -Hypertensive CVA in 04/2016   CC:  Andrew Cuevas, Andrew Cuevas*  Agree with Ms. Andrew Cuevas's management.  Iva Booparl E. Gessner, MD, Clementeen GrahamFACG

## 2016-08-18 NOTE — Telephone Encounter (Signed)
Relation to EA:VWUJpt:self Call back number: 1st # [phone] / [phone]   Reason for call: Brother calling back returning call regarding lab results, please advise

## 2016-08-19 NOTE — Telephone Encounter (Signed)
Called and spoke with the pt's brother and he stated that the pt went to see GI on yesterday and the pt is scheduled for Colonoscopy on (09/29/16).  Informed him again of the pt recent lab results and note.  Along with the pt's next office visit here with Dr. Wendling.//AB/CMA

## 2016-09-03 ENCOUNTER — Telehealth: Payer: Self-pay | Admitting: Family Medicine

## 2016-09-03 NOTE — Telephone Encounter (Signed)
Call his psychiatrist. If he is no longer seeing Dr. Wynn BankerKirsteins, I can take over. TY.

## 2016-09-03 NOTE — Telephone Encounter (Signed)
Received refill request for Fluoxetine 10 mg capsule, take 1 by mouth daily. Previously prescribed by Dr. Donavan FoilAndrew Kirsteins--do you want to prescribe? Not on current list.

## 2016-09-04 NOTE — Telephone Encounter (Signed)
I was unable to get through at Dr. Trecia RogersKirsten's office. Did call and speak to his wife.  He is no longer seeing Dr. Fritzi MandesKirsten, but she stated he is not taking that medication and not to fill.

## 2016-09-04 NOTE — Telephone Encounter (Signed)
OK, we will hold off on filling. It is not on our records that he is taking either. TY.

## 2016-09-09 ENCOUNTER — Ambulatory Visit (INDEPENDENT_AMBULATORY_CARE_PROVIDER_SITE_OTHER): Payer: Medicare Other | Admitting: Family Medicine

## 2016-09-09 ENCOUNTER — Encounter: Payer: Self-pay | Admitting: Family Medicine

## 2016-09-09 VITALS — BP 134/78 | HR 76 | Temp 98.0°F | Ht 70.0 in | Wt 165.6 lb

## 2016-09-09 DIAGNOSIS — R634 Abnormal weight loss: Secondary | ICD-10-CM

## 2016-09-09 DIAGNOSIS — R768 Other specified abnormal immunological findings in serum: Secondary | ICD-10-CM | POA: Diagnosis not present

## 2016-09-09 LAB — CBC
HEMATOCRIT: 35.3 % — AB (ref 39.0–52.0)
Hemoglobin: 11.4 g/dL — ABNORMAL LOW (ref 13.0–17.0)
MCHC: 32.4 g/dL (ref 30.0–36.0)
MCV: 85 fl (ref 78.0–100.0)
PLATELETS: 219 10*3/uL (ref 150.0–400.0)
RBC: 4.15 Mil/uL — AB (ref 4.22–5.81)
RDW: 15.6 % — ABNORMAL HIGH (ref 11.5–15.5)
WBC: 2.9 10*3/uL — AB (ref 4.0–10.5)

## 2016-09-09 LAB — COMPREHENSIVE METABOLIC PANEL
ALBUMIN: 4.1 g/dL (ref 3.5–5.2)
ALK PHOS: 53 U/L (ref 39–117)
ALT: 13 U/L (ref 0–53)
AST: 25 U/L (ref 0–37)
BUN: 14 mg/dL (ref 6–23)
CALCIUM: 9.9 mg/dL (ref 8.4–10.5)
CHLORIDE: 108 meq/L (ref 96–112)
CO2: 23 mEq/L (ref 19–32)
CREATININE: 1.52 mg/dL — AB (ref 0.40–1.50)
GFR: 58.08 mL/min — ABNORMAL LOW (ref >60.00)
Glucose, Bld: 84 mg/dL (ref 70–99)
POTASSIUM: 4.5 meq/L (ref 3.5–5.1)
SODIUM: 141 meq/L (ref 135–145)
TOTAL PROTEIN: 7.7 g/dL (ref 6.0–8.3)
Total Bilirubin: 0.7 mg/dL (ref 0.2–1.2)

## 2016-09-09 LAB — TSH: TSH: 0.95 u[IU]/mL (ref 0.35–4.50)

## 2016-09-09 NOTE — Patient Instructions (Signed)
If you do not hear anything about your referral in the next week, call our office and ask for an update.  Keep up with Ensure and protein supplementation.  I do not want to start anything that will stimulate the appetite at this time until we see the results of your colonoscopy and labs.   Give us 2-3 business days to get the results of your labs back. If normal, we will mail you a letter, hence, no new is good news.   Let us know if you need anything.

## 2016-09-09 NOTE — Progress Notes (Signed)
Chief Complaint  Patient presents with  . Follow-up    1 mos    Subjective: Patient is a 71 y.o. male here for weight check and poor appetite.  I saw the patient around 1 month ago for a hospital follow-up. His appetite has not been back to normal since then. He has lost another 5 pounds per our scales. They have been using Ensure without much noticeable benefit. He has a colonoscopy scheduled for the end of the mo. He has never had one before. Denies fevers, pain, SOB, cough, or diarrhea.    ROS: Heart: Denies chest pain  Lungs: Denies SOB   Family History  Problem Relation Age of Onset  . Hypertension Mother   . Arthritis Mother   . Alzheimer's disease Father   . Stroke Sister    Past Medical History:  Diagnosis Date  . Chronic headaches   . CVA (cerebral vascular accident) (HCC)   . HTN (hypertension), malignant    No Known Allergies  Current Outpatient Prescriptions:  .  amLODipine (NORVASC) 5 MG tablet, Take 1 tablet (5 mg total) by mouth daily., Disp: 90 tablet, Rfl: 3 .  cloNIDine (CATAPRES) 0.1 MG tablet, Take 1 tablet (0.1 mg total) by mouth 2 (two) times daily., Disp: 60 tablet, Rfl: 0 .  feeding supplement, ENSURE ENLIVE, (ENSURE ENLIVE) LIQD, Take 237 mLs by mouth 2 (two) times daily between meals., Disp: 60 Bottle, Rfl: 0 .  omeprazole (PRILOSEC) 20 MG capsule, Take 1 capsule (20 mg total) by mouth 2 (two) times daily before a meal., Disp: 180 capsule, Rfl: 1 .  senna-docusate (SENOKOT-S) 8.6-50 MG tablet, Take 1 tablet by mouth 2 (two) times daily., Disp: 180 tablet, Rfl: 1  Objective: BP 134/78 (BP Location: Left Arm, Patient Position: Sitting, Cuff Size: Normal)   Pulse 76   Temp 98 F (36.7 C) (Oral)   Ht 5\' 10"  (1.778 m)   Wt 165 lb 9.6 oz (75.1 kg)   SpO2 96%   BMI 23.76 kg/m  General: Awake, appears stated age HEENT: MMM, EOMi Heart: RRR, no murmurs Lungs: CTAB, no rales, wheezes or rhonchi. No accessory muscle use Abd: BS+, soft, NT, ND, no  masses or organomegaly Psych: Flat affect, nml mood  Assessment and Plan: Positive hepatitis C antibody test - Plan: Hepatitis C Genotype, Hepatitis C RNA quantitative  Weight loss - Plan: CBC, Comprehensive metabolic panel, TSH, HIV antibody, T4, Amb ref to Medical Nutrition Therapy-MNT  Orders as above. Will consider Mirtazapine vs Marinol at next visit if everything is normal to help with his appetite.  F/u in 6 weeks (rather than 4 weeks) to give time for any potential biopsy results to come back.  The patient and his brother voiced understanding and agreement to the plan.  [name] CommerceWendling, DO 09/09/16  12:02 PM

## 2016-09-10 LAB — T4: T4, Total: 9.5 ug/dL (ref 4.5–12.0)

## 2016-09-10 LAB — HIV ANTIBODY (ROUTINE TESTING W REFLEX): HIV: NONREACTIVE

## 2016-09-11 LAB — HEPATITIS C RNA QUANTITATIVE
HCV QUANT LOG: 5.43 {Log_IU}/mL — AB
HCV QUANT: 272000 [IU]/mL — AB

## 2016-09-13 LAB — HEPATITIS C GENOTYPE

## 2016-09-14 ENCOUNTER — Other Ambulatory Visit: Payer: Self-pay | Admitting: Family Medicine

## 2016-09-14 DIAGNOSIS — B182 Chronic viral hepatitis C: Secondary | ICD-10-CM

## 2016-09-14 NOTE — Progress Notes (Signed)
Called pt regarding lab results. Informed him we will be sending him to hepatology. Order placed.

## 2016-09-15 ENCOUNTER — Encounter: Payer: Self-pay | Admitting: Nurse Practitioner

## 2016-09-15 ENCOUNTER — Encounter: Payer: Self-pay | Admitting: Internal Medicine

## 2016-09-15 ENCOUNTER — Ambulatory Visit (INDEPENDENT_AMBULATORY_CARE_PROVIDER_SITE_OTHER): Payer: Medicare Other | Admitting: Nurse Practitioner

## 2016-09-15 VITALS — BP 138/85 | HR 59 | Wt 163.2 lb

## 2016-09-15 DIAGNOSIS — I619 Nontraumatic intracerebral hemorrhage, unspecified: Secondary | ICD-10-CM

## 2016-09-15 DIAGNOSIS — I10 Essential (primary) hypertension: Secondary | ICD-10-CM | POA: Diagnosis not present

## 2016-09-15 NOTE — Patient Instructions (Signed)
Aspirin .81mg  daily for secondary stroke prevention   blood  pressure goal 130/90 today's reading 138/85 continue blood pressure medications LDL cholesterol below 70 Hemoglobin A1c below 6.5 Tremor is stable Follow-up in 6 months

## 2016-09-15 NOTE — Progress Notes (Signed)
I agree with the above plan 

## 2016-09-15 NOTE — Progress Notes (Signed)
[facility]  PATIENT: Andrew Cuevas DOB: 1945-11-13   REASON FOR VISIT: Follow-up for intracerebral hemorrhage HISTORY FROM:Patient and brother   HISTORY OF PRESENT ILLNESS:UPDATE 07/17/2018CM [name], 71 year old male returns for follow-up with history of intracerebral hemorrhage in February 2018. He was seen in the ER on 07/14/2016 for confusion. CT of the head without acute findings and no intracranial mass hemorrhage or edema.He had hydrocelectomy on 07/02/16 by Dr. Annabell HowellsWrenn.  Blood pressure in the office today 138/85. When last seen in this office some  resting tremors were noted, however on return visit today no resting tremor evident. No falls no balance issues no bradykinesia. He returns for reevaluation  HISTORY 09/07/16 PS [age] year African-American male seen today for first office follow-up visit following hospital admission for intracerebral hemorrhage in February 2018. He is accompanied today by his brother. History is obtained from the patient, brother and reactive hospital medical records. I personally reviewed brain imaging films.Andrew [name] a 70 y.o.malewith an unclear last seen well given that he did not really notice the symptoms that much but does state that he has had worse difficult to walking recently. It is suspected that after arriving here, he developed right-sided mild hemiparesis but has been stable over the past few hours. A CT was done which showed a  4.4 x 1.6 cm basal ganglia hemorrhage.there was mild surrounding edema but no midline shift or hydrocephalus He actually presented due to testicular pain which the ED evaluated and feels is hydrocele with no need for acute management. He was last known well is unclear. ICH Score: 0. Hewas admitted to the neuro ICU for further evaluation and treatment. his blood pressure was significantly elevated  at 245/133 on admission requiring ICU admission but blood pressure drops for control. CT angiogram of the  brain and neck was obtained which was negative for any AV malformation or aneurysm. Follow-up CT scan showed stable appearance of the hematoma. Transthoracic echo showed normal ejection fraction. LDL cholesterol was 69 mg percent and hemoglobin A1c was 5.7. Urine drug screen was positive for cocaine. She was counseled to quit cocaine as well as smoking cigarettes. Patient did well during hospitalization and mild right-sided weakness improved. Did have significant scrotal swelling and underwent ultrasound of the scrotum which showed hydrocephalus and small benign-appearing bilateral intratesticular cyst. Patient was discharged home with instructions for outpatient physical occupational therapy as well as see his primary care physician to be referred to a urologist. Patient has done well since discharge without any increase in physical weakness however family has noticed some resting tremors of his hands. He has had difficulty walking mostly due to discomfort from his hydrocele and testicular pain. He has not yet seen a urologist but has an appointment for 1 week from now. Patient denies any headache and slurred speech and bradykinesia. His blood pressure is apparently well controlled but today was found to be elevated at 195/112 in office but he was trembling a lot. When I checked his blood pressure myself I was not able to get a reliable sounds with the stethoscope but manual blood pressure checked to systolic of 150.    REVIEW OF SYSTEMS: Full 14 system review of systems performed and notable only for those listed, all others are neg:  Constitutional: Fatigue  Cardiovascular: neg Ear/Nose/Throat: neg  Skin: neg Eyes: neg Respiratory: neg Gastroitestinal: neg  Hematology/Lymphatic: neg  Endocrine: neg Musculoskeletal:neg Allergy/Immunology: neg Neurological: neg Psychiatric: neg Sleep : neg   ALLERGIES: No Known Allergies  HOME MEDICATIONS: Outpatient Medications Prior to Visit  Medication  Sig Dispense Refill  . amLODipine (NORVASC) 5 MG tablet Take 1 tablet (5 mg total) by mouth daily. 90 tablet 3  . cloNIDine (CATAPRES) 0.1 MG tablet Take 1 tablet (0.1 mg total) by mouth 2 (two) times daily. 60 tablet 0  . omeprazole (PRILOSEC) 20 MG capsule Take 1 capsule (20 mg total) by mouth 2 (two) times daily before a meal. 180 capsule 1  . senna-docusate (SENOKOT-S) 8.6-50 MG tablet Take 1 tablet by mouth 2 (two) times daily. 180 tablet 1  . feeding supplement, ENSURE ENLIVE, (ENSURE ENLIVE) LIQD Take 237 mLs by mouth 2 (two) times daily between meals. (Patient not taking: Reported on 09/15/2016) 60 Bottle 0   No facility-administered medications prior to visit.     PAST MEDICAL HISTORY: Past Medical History:  Diagnosis Date  . Chronic headaches   . CVA (cerebral vascular accident) (HCC)   . HTN (hypertension), malignant     PAST SURGICAL HISTORY: Past Surgical History:  Procedure Laterality Date  . HERNIA REPAIR Bilateral   . HYDROCELE EXCISION Left 07/02/2016   Procedure: HYDROCELECTOMY ADULT;  Surgeon: Bjorn Pippin, MD;  Location: [facility];  Service: Urology;  Laterality: Left;    FAMILY HISTORY: Family History  Problem Relation Age of Onset  . Hypertension Mother   . Arthritis Mother   . Alzheimer's disease Father   . Stroke Sister     SOCIAL HISTORY: Social History   Social History  . Marital status: Single    Spouse name: N/A  . Number of children: N/A  . Years of education: N/A   Occupational History  . Not on file.   Social History Main Topics  . Smoking status: Former Smoker    Packs/day: 0.50    Types: Cigarettes    Quit date: 04/27/2016  . Smokeless tobacco: Never Used  . Alcohol use No  . Drug use: No  . Sexual activity: Not on file   Other Topics Concern  . Not on file   Social History Narrative   Lives with sister.      PHYSICAL EXAM  Vitals:   09/15/16 1253  BP: 138/85  Pulse: (!) 59  Weight: 163 lb 3.2 oz (74 kg)   Body mass  index is 23.42 kg/m.  Generalized: Well developed, in no acute distress  Head: normocephalic and atraumatic,. Oropharynx benign  Neck: Supple, no carotid bruits  Cardiac: Regular rate rhythm, no murmur  Musculoskeletal: No deformity   Neurological examination   Mentation: Alert oriented to time, place, history taking. Attention span and concentration appropriate. Recent and remote memory intact.  Follows all commands speech and language fluent.   Cranial nerve II-XII: Pupils were equal round reactive to light extraocular movements were full, visual field were full on confrontational test. Facial sensation and strength were normal. hearing was intact to finger rubbing bilaterally. Uvula tongue midline. head turning and shoulder shrug were normal and symmetric.Tongue protrusion into cheek strength was normal. Motor: normal bulk and tone, full strength in the BUE, BLE, fine finger movements normal, no pronator drift. No focal weakness Sensory: normal and symmetric to light touch, pinprick, and  Vibration, in the upper and lower extremities  Coordination: finger-nose-finger, heel-to-shin bilaterally, no dysmetria, no tremor Reflexes: 1+ upper lower and symmetric plantar responses were flexor bilaterally. Gait and Station: Rising up from seated position without assistance, normal stance,  moderate stride, good arm swing, smooth turning, able to perform tiptoe, and heel walking  without difficulty. Tandem gait is mildly unsteady  DIAGNOSTIC DATA (LABS, IMAGING, TESTING) - I reviewed patient records, labs, notes, testing and imaging myself where available.  Lab Results  Component Value Date   WBC 2.9 (L) 09/09/2016   HGB 11.4 (L) 09/09/2016   HCT 35.3 (L) 09/09/2016   MCV 85.0 09/09/2016   PLT 219.0 09/09/2016      Component Value Date/Time   NA 141 09/09/2016 1205   K 4.5 09/09/2016 1205   CL 108 09/09/2016 1205   CO2 23 09/09/2016 1205   GLUCOSE 84 09/09/2016 1205   BUN 14 09/09/2016  1205   CREATININE 1.52 (H) 09/09/2016 1205   CALCIUM 9.9 09/09/2016 1205   PROT 7.7 09/09/2016 1205   ALBUMIN 4.1 09/09/2016 1205   AST 25 09/09/2016 1205   ALT 13 09/09/2016 1205   ALKPHOS 53 09/09/2016 1205   BILITOT 0.7 09/09/2016 1205   GFRNONAA 49 (L) 07/17/2016 0348   GFRAA 57 (L) 07/17/2016 0348   Lab Results  Component Value Date   CHOL 142 03/25/2016   HDL 48.80 03/25/2016   LDLCALC 69 03/25/2016   TRIG 118.0 03/25/2016   CHOLHDL 3 03/25/2016   Lab Results  Component Value Date   HGBA1C 5.7 (H) 04/03/2016   Lab Results  Component Value Date   VITAMINB12 136 (L) 04/03/2016   Lab Results  Component Value Date   TSH 0.95 09/09/2016    ASSESSMENT AND PLAN 50 year African-American male with left basal ganglia hemorrhage in February 2018 secondary to malignant hypertension.   Discussed with Dr. Pearlean Brownie, reviewed CT of the head from May 15,2018 May begin Aspirin .81mg  daily for secondary stroke prevention   blood  pressure goal 130/90 today's reading 138/85 continue blood pressure medications LDL cholesterol below 70 Hemoglobin A1c below 6.5 Tremor is stable Moderate exercise Keep GI appointment for colonoscopy Follow-up in 6 months I spent 25 minutes in total face to face time with the patient more than 50% of which was spent counseling and coordination of care, reviewing test results reviewing medications and discussing and reviewing the diagnosis of stroke and management of risk factors and further treatment options. Answered questions. , Cline Crock, [facility], APRN  [facility] [address] [place], [place] [address] [phone]

## 2016-09-29 ENCOUNTER — Ambulatory Visit (AMBULATORY_SURGERY_CENTER): Payer: Medicare Other | Admitting: Internal Medicine

## 2016-09-29 ENCOUNTER — Encounter: Payer: Self-pay | Admitting: Internal Medicine

## 2016-09-29 VITALS — BP 140/97 | HR 66 | Temp 99.6°F | Resp 12 | Ht 70.0 in | Wt 168.0 lb

## 2016-09-29 DIAGNOSIS — Z1211 Encounter for screening for malignant neoplasm of colon: Secondary | ICD-10-CM

## 2016-09-29 DIAGNOSIS — Z1212 Encounter for screening for malignant neoplasm of rectum: Secondary | ICD-10-CM

## 2016-09-29 MED ORDER — SODIUM CHLORIDE 0.9 % IV SOLN: 500.0000 mL | INTRAVENOUS | Status: DC

## 2016-09-29 NOTE — Op Note (Signed)
[facility] Patient Name: Andrew Cuevas Procedure Date: 09/29/2016 1:17 PM MRN: [id number] Endoscopist: Iva Booparl E Bertel Venard , MD Age: [age] Referring MD:  Date of Birth: 05/04/1945 Gender: Male Account #: [id number] Procedure:                Colonoscopy Indications:              Screening for colorectal malignant neoplasm Medicines:                Propofol per Anesthesia, Monitored Anesthesia Care Procedure:                Pre-Anesthesia Assessment:                           - Prior to the procedure, a History and Physical                            was performed, and patient medications and                            allergies were reviewed. The patient's tolerance of                            previous anesthesia was also reviewed. The risks                            and benefits of the procedure and the sedation                            options and risks were discussed with the patient.                            All questions were answered, and informed consent                            was obtained. Prior Anticoagulants: The patient has                            taken no previous anticoagulant or antiplatelet                            agents. ASA Grade Assessment: II - A patient with                            mild systemic disease. After reviewing the risks                            and benefits, the patient was deemed in                            satisfactory condition to undergo the procedure.                           After obtaining informed consent, the colonoscope  was passed under direct vision. Throughout the                            procedure, the patient's blood pressure, pulse, and                            oxygen saturations were monitored continuously. The                            Colonoscope was introduced through the anus and                            advanced to the the cecum, identified by   appendiceal orifice and ileocecal valve. The                            colonoscopy was performed without difficulty. The                            patient tolerated the procedure well. The quality                            of the bowel preparation was good. The bowel                            preparation used was Miralax. The ileocecal valve,                            appendiceal orifice, and rectum were photographed. Scope In: 1:30:40 PM Scope Out: 1:47:25 PM Scope Withdrawal Time: 0 hours 9 minutes 9 seconds  Total Procedure Duration: 0 hours 16 minutes 45 seconds  Findings:                 The perianal and digital rectal examinations were                            normal. Pertinent negatives include normal prostate                            (size, shape, and consistency).                           Many small and large-mouthed diverticula were found                            in the entire colon.                           Internal hemorrhoids were found during retroflexion.                           The exam was otherwise without abnormality on                            direct and retroflexion views. Complications:  No immediate complications. Estimated Blood Loss:     Estimated blood loss: none. Impression:               - Severe diverticulosis in the entire examined                            colon.                           - Internal hemorrhoids.                           - The examination was otherwise normal on direct                            and retroflexion views.                           - No specimens collected. Recommendation:           - Patient has a contact number available for                            emergencies. The signs and symptoms of potential                            delayed complications were discussed with the                            patient. Return to normal activities tomorrow.                            Written discharge  instructions were provided to the                            patient.                           - Resume previous diet.                           - Continue present medications.                           - No repeat colonoscopy due to age and the absence                            of colonic polyps. Iva Boop, MD 09/29/2016 1:56:20 PM This report has been signed electronically.

## 2016-09-29 NOTE — Progress Notes (Signed)
Pt to PACU-- AW patent--- VSS---- Report to RN 

## 2016-09-29 NOTE — Patient Instructions (Addendum)
No polyps or cancer seen.  You do have diverticulosis - thickened muscle rings and pouches in the colon wall. Please read the handout about this condition.  I appreciate the opportunity to care for you. Iva Booparl E. Beverly Ferner, MD, FACG  YOU HAD AN ENDOSCOPIC PROCEDURE TODAY AT THE [facility]:   Refer to the procedure report that was given to you for any specific questions about what was found during the examination.  If the procedure report does not answer your questions, please call your gastroenterologist to clarify.  If you requested that your care partner not be given the details of your procedure findings, then the procedure report has been included in a sealed envelope for you to review at your convenience later.  YOU SHOULD EXPECT: Some feelings of bloating in the abdomen. Passage of more gas than usual.  Walking can help get rid of the air that was put into your GI tract during the procedure and reduce the bloating. If you had a lower endoscopy (such as a colonoscopy or flexible sigmoidoscopy) you may notice spotting of blood in your stool or on the toilet paper. If you underwent a bowel prep for your procedure, you may not have a normal bowel movement for a few days.  Please Note:  You might notice some irritation and congestion in your nose or some drainage.  This is from the oxygen used during your procedure.  There is no need for concern and it should clear up in a day or so.  SYMPTOMS TO REPORT IMMEDIATELY:   Following lower endoscopy (colonoscopy or flexible sigmoidoscopy):  Excessive amounts of blood in the stool  Significant tenderness or worsening of abdominal pains  Swelling of the abdomen that is new, acute  Fever of 100F or higher   Following upper endoscopy (EGD)  Vomiting of blood or coffee ground material  New chest pain or pain under the shoulder blades  Painful or persistently difficult swallowing  New shortness of breath  Fever of 100F or  higher  Black, tarry-looking stools  For urgent or emergent issues, a gastroenterologist can be reached at any hour by calling [phone].   DIET:  We do recommend a small meal at first, but then you may proceed to your regular diet.  Drink plenty of fluids but you should avoid alcoholic beverages for 24 hours ACTIVITY:  You should plan to take it easy for the rest of today and you should NOT DRIVE or use heavy machinery until tomorrow (because of the sedation medicines used during the test).    FOLLOW UP: Our staff will call the number listed on your records the next business day following your procedure to check on you and address any questions or concerns that you may have regarding the information given to you following your procedure. If we do not reach you, we will leave a message.  However, if you are feeling well and you are not experiencing any problems, there is no need to return our call.  We will assume that you have returned to your regular daily activities without incident.  If any biopsies were taken you will be contacted by phone or by letter within the next 1-3 weeks.  Please call us at [phone] if you have not heard about the biopsies in 3 weeks.    SIGNATURES/CONFIDENTIALity You and/or your care partner have signed paperwork which will be entered into your electronic medical record.  These signatures attest to the fact that that  the information above on your After Visit Summary has been reviewed and is understood.  Full responsibility of the confidentiality of this discharge information lies with you and/or your care-partner.  Diverticulosis information given.

## 2016-09-30 ENCOUNTER — Telehealth: Payer: Self-pay

## 2016-09-30 NOTE — Telephone Encounter (Signed)
  Follow up Call-  Call back number 09/29/2016  Post procedure Call Back phone  # [phone]  Permission to leave phone message Yes     Patient questions:  Do you have a fever, pain , or abdominal swelling? No. Pain Score  0 *  Have you tolerated food without any problems? Yes.    Have you been able to return to your normal activities? Yes.    Do you have any questions about your discharge instructions: Diet   No. Medications  No. Follow up visit  No.  Do you have questions or concerns about your Care? No.  Actions: * If pain score is 4 or above: No action needed, pain <4.

## 2016-10-06 ENCOUNTER — Other Ambulatory Visit (HOSPITAL_COMMUNITY): Payer: Self-pay | Admitting: Nurse Practitioner

## 2016-10-06 DIAGNOSIS — B182 Chronic viral hepatitis C: Secondary | ICD-10-CM

## 2016-10-07 ENCOUNTER — Telehealth: Payer: Self-pay | Admitting: *Deleted

## 2016-10-07 NOTE — Telephone Encounter (Signed)
[facility], forwarded to provider/SLS 08/08

## 2016-10-14 ENCOUNTER — Ambulatory Visit (HOSPITAL_COMMUNITY)
Admission: RE | Admit: 2016-10-14 | Discharge: 2016-10-14 | Disposition: A | Discharge status: Home / Self Care | Payer: Medicare Other | Source: Ambulatory Visit | Attending: Nurse Practitioner | Admitting: Nurse Practitioner

## 2016-10-14 DIAGNOSIS — B182 Chronic viral hepatitis C: Secondary | ICD-10-CM | POA: Diagnosis present

## 2017-03-10 ENCOUNTER — Telehealth: Payer: Self-pay | Admitting: Family Medicine

## 2017-03-10 DIAGNOSIS — I639 Cerebral infarction, unspecified: Secondary | ICD-10-CM

## 2017-03-10 DIAGNOSIS — R627 Adult failure to thrive: Secondary | ICD-10-CM

## 2017-03-10 NOTE — Telephone Encounter (Signed)
Copied from CRM [phone]. Topic: Quick Communication - See Telephone Encounter >> Mar 10, 2017  2:31 PM Trula SladeWalter, Linda F wrote: CRM for notification. See Telephone encounter for:  03/10/17. Patient's grand daughter Briant CedarKesha Jones [phone] who is the patient's POA wants to know why the patient's physical therapy and occupational therapy stopped because the patient needs it and they need a new order for him to start back.

## 2017-03-10 NOTE — Telephone Encounter (Signed)
No one told us it was stopped or that it needed to be continued. Please reorder. TY.

## 2017-03-11 NOTE — Telephone Encounter (Signed)
Referral entered  

## 2017-03-11 NOTE — Addendum Note (Signed)
Addended by: Scharlene GlossEWING, Juwan Vences B on: 03/11/2017 12:44 PM   Modules accepted: Orders

## 2017-03-15 ENCOUNTER — Encounter: Payer: Self-pay | Admitting: Nurse Practitioner

## 2017-03-15 ENCOUNTER — Ambulatory Visit: Payer: Medicare PPO | Admitting: Nurse Practitioner

## 2017-03-15 VITALS — BP 138/70 | HR 82 | Wt 199.0 lb

## 2017-03-15 DIAGNOSIS — I10 Essential (primary) hypertension: Secondary | ICD-10-CM

## 2017-03-15 DIAGNOSIS — I619 Nontraumatic intracerebral hemorrhage, unspecified: Secondary | ICD-10-CM | POA: Diagnosis not present

## 2017-03-15 DIAGNOSIS — I610 Nontraumatic intracerebral hemorrhage in hemisphere, subcortical: Secondary | ICD-10-CM | POA: Diagnosis not present

## 2017-03-15 NOTE — Progress Notes (Signed)
I agree with the above plan 

## 2017-03-15 NOTE — Progress Notes (Signed)
[facility]  PATIENT: Graylen Danzer DOB: Mar 15, 1945   REASON FOR VISIT: Follow-up for intracerebral hemorrhage Feb 2018 HISTORY FROM:Patient and grandson   HISTORY OF PRESENT ILLNESS:UPDATE 1/14/2019CM Mr. Cyndie Chime, 72 year old male returns for follow-up with his grandson with whom he lives.  He has now moved to [facility].  He has a history of intracerebral hemorrhage in February 2018.  He remains on aspirin 0.81 daily without significant bruising and no bleeding.  Blood pressure in the office today 138/70.  He is compliant with his blood pressure medications.  He denies any recent falls.  He uses a single-point cane at times.  He walks around the block for exercise.  Appetite is good.  He is sleeping well at night.  He returns to the stroke clinic for evaluation   UPDATE 07/17/2018CM Mr. Cyndie Chime, 72 year old male returns for follow-up with history of intracerebral hemorrhage in February 2018. He was seen in the ER on 07/14/2016 for confusion. CT of the head without acute findings and no intracranial mass hemorrhage or edema.He had hydrocelectomy on 07/02/16 by Dr. Annabell Howells.  Blood pressure in the office today 138/85. When last seen in this office some  resting tremors were noted, however on return visit today no resting tremor evident. No falls no balance issues no bradykinesia. He returns for reevaluation  HISTORY 06/08/16 PS 39 year African-American male seen today for first office follow-up visit following hospital admission for intracerebral hemorrhage in February 2018. He is accompanied today by his brother. History is obtained from the patient, brother and reactive hospital medical records. I personally reviewed brain imaging films.Kimothy [name] a 72 y.o.malewith an unclear last seen well given that he did not really notice the symptoms that much but does state that he has had worse difficult to walking recently. It is suspected that after arriving here, he developed  right-sided mild hemiparesis but has been stable over the past few hours. A CT was done which showed a  4.4 x 1.6 cm basal ganglia hemorrhage.there was mild surrounding edema but no midline shift or hydrocephalus He actually presented due to testicular pain which the ED evaluated and feels is hydrocele with no need for acute management. He was last known well is unclear. ICH Score: 0. Hewas admitted to the neuro ICU for further evaluation and treatment. his blood pressure was significantly elevated  at 245/133 on admission requiring ICU admission but blood pressure drops for control. CT angiogram of the brain and neck was obtained which was negative for any AV malformation or aneurysm. Follow-up CT scan showed stable appearance of the hematoma. Transthoracic echo showed normal ejection fraction. LDL cholesterol was 69 mg percent and hemoglobin A1c was 5.7. Urine drug screen was positive for cocaine. He was counseled to quit cocaine as well as smoking cigarettes. Patient did well during hospitalization and mild right-sided weakness improved. Did have significant scrotal swelling and underwent ultrasound of the scrotum which showed hydrocephalus and small benign-appearing bilateral intratesticular cyst. Patient was discharged home with instructions for outpatient physical occupational therapy as well as see his primary care physician to be referred to a urologist. Patient has done well since discharge without any increase in physical weakness however family has noticed some resting tremors of his hands. He has had difficulty walking mostly due to discomfort from his hydrocele and testicular pain. He has not yet seen a urologist but has an appointment for 1 week from now. Patient denies any headache and slurred speech and bradykinesia. His blood pressure  is apparently well controlled but today was found to be elevated at 195/112 in office but he was trembling a lot. When I checked his blood pressure myself I was not  able to get a reliable sounds with the stethoscope but manual blood pressure checked to systolic of 150.    REVIEW OF SYSTEMS: Full 14 system review of systems performed and notable only for those listed, all others are neg:  Constitutional: Fatigue  Cardiovascular: neg Ear/Nose/Throat: neg  Skin: neg Eyes: neg Respiratory: neg Gastroitestinal: neg  Hematology/Lymphatic: Easy bruising Endocrine: neg Musculoskeletal: Ambulates with single-point cane Allergy/Immunology: neg Neurological: neg Psychiatric: neg Sleep : neg   ALLERGIES: No Known Allergies  HOME MEDICATIONS: Outpatient Medications Prior to Visit  Medication Sig Dispense Refill  . amLODipine (NORVASC) 5 MG tablet Take 1 tablet (5 mg total) by mouth daily. 90 tablet 3  . aspirin EC 81 MG tablet Take 81 mg by mouth daily.    . cloNIDine (CATAPRES) 0.1 MG tablet Take 1 tablet (0.1 mg total) by mouth 2 (two) times daily. 60 tablet 0  . feeding supplement, ENSURE ENLIVE, (ENSURE ENLIVE) LIQD Take 237 mLs by mouth 2 (two) times daily between meals. 60 Bottle 0  . HARVONI 90-400 MG TABS daily. 03/15/17 X 12 weeks  2  . LOSARTAN POTASSIUM PO Take 100 mg by mouth daily.    . Magnesium 400 MG CAPS Take by mouth.    Marland Kitchen omeprazole (PRILOSEC) 20 MG capsule Take 1 capsule (20 mg total) by mouth 2 (two) times daily before a meal. (Patient not taking: Reported on 03/15/2017) 180 capsule 1  . senna-docusate (SENOKOT-S) 8.6-50 MG tablet Take 1 tablet by mouth 2 (two) times daily. 180 tablet 1   No facility-administered medications prior to visit.     PAST MEDICAL HISTORY: Past Medical History:  Diagnosis Date  . Chronic headaches   . CVA (cerebral vascular accident) (HCC)   . HTN (hypertension), malignant     PAST SURGICAL HISTORY: Past Surgical History:  Procedure Laterality Date  . HERNIA REPAIR Bilateral   . HYDROCELE EXCISION Left 07/02/2016   Procedure: HYDROCELECTOMY ADULT;  Surgeon: Bjorn Pippin, MD;  Location: [facility];   Service: Urology;  Laterality: Left;    FAMILY HISTORY: Family History  Problem Relation Age of Onset  . Hypertension Mother   . Arthritis Mother   . Alzheimer's disease Father   . Stroke Sister     SOCIAL HISTORY: Social History   Socioeconomic History  . Marital status: Widowed    Spouse name: Not on file  . Number of children: Not on file  . Years of education: Not on file  . Highest education level: Not on file  Social Needs  . Financial resource strain: Not on file  . Food insecurity - worry: Not on file  . Food insecurity - inability: Not on file  . Transportation needs - medical: Not on file  . Transportation needs - non-medical: Not on file  Occupational History  . Not on file  Tobacco Use  . Smoking status: Former Smoker    Packs/day: 0.50    Types: Cigarettes    Last attempt to quit: 04/27/2016    Years since quitting: 0.8  . Smokeless tobacco: Never Used  Substance and Sexual Activity  . Alcohol use: No  . Drug use: No  . Sexual activity: Not on file  Other Topics Concern  . Not on file  Social History Narrative   Lives with sister.  PHYSICAL EXAM  Vitals:   03/15/17 1052  BP: (!) 138/70  Pulse: 82  Weight: 199 lb (90.3 kg)   Body mass index is 28.55 kg/m.  Generalized: Well developed, in no acute distress  Head: normocephalic and atraumatic,. Oropharynx benign  Neck: Supple, no carotid bruits  Cardiac: Regular rate rhythm, no murmur  Musculoskeletal: No deformity   Neurological examination   Mentation: Alert oriented to time, place, history taking. Attention span and concentration appropriate. Recent and remote memory intact.  Follows all commands speech and language fluent.   Cranial nerve II-XII: Pupils were equal round reactive to light extraocular movements were full, visual field were full on confrontational test. Facial sensation and strength were normal. hearing was intact to finger rubbing bilaterally. Uvula tongue midline.  head turning and shoulder shrug were normal and symmetric.Tongue protrusion into cheek strength was normal. Motor: normal bulk and tone, full strength in the BUE, BLE, fine finger movements normal, no pronator drift. No focal weakness Sensory: normal and symmetric to light touch, pinprick, and  Vibration, in the upper and lower extremities  Coordination: finger-nose-finger, heel-to-shin bilaterally, no dysmetria, no tremor Reflexes: 1+ upper lower and symmetric plantar responses were flexor bilaterally. Gait and Station: Rising up from seated position without assistance, normal stance,  moderate stride, good arm swing, smooth turning, able to perform tiptoe, and heel walking without difficulty. Tandem gait is mildly unsteady.  Ambulates with single-point cane  DIAGNOSTIC DATA (LABS, IMAGING, TESTING) - I reviewed patient records, labs, notes, testing and imaging myself where available.  Lab Results  Component Value Date   WBC 2.9 (L) 09/09/2016   HGB 11.4 (L) 09/09/2016   HCT 35.3 (L) 09/09/2016   MCV 85.0 09/09/2016   PLT 219.0 09/09/2016      Component Value Date/Time   NA 141 09/09/2016 1205   K 4.5 09/09/2016 1205   CL 108 09/09/2016 1205   CO2 23 09/09/2016 1205   GLUCOSE 84 09/09/2016 1205   BUN 14 09/09/2016 1205   CREATININE 1.52 (H) 09/09/2016 1205   CALCIUM 9.9 09/09/2016 1205   PROT 7.7 09/09/2016 1205   ALBUMIN 4.1 09/09/2016 1205   AST 25 09/09/2016 1205   ALT 13 09/09/2016 1205   ALKPHOS 53 09/09/2016 1205   BILITOT 0.7 09/09/2016 1205   GFRNONAA 49 (L) 07/17/2016 0348   GFRAA 57 (L) 07/17/2016 0348   Lab Results  Component Value Date   CHOL 142 03/25/2016   HDL 48.80 03/25/2016   LDLCALC 69 03/25/2016   TRIG 118.0 03/25/2016   CHOLHDL 3 03/25/2016   Lab Results  Component Value Date   HGBA1C 5.7 (H) 04/03/2016   Lab Results  Component Value Date   VITAMINB12 136 (L) 04/03/2016   Lab Results  Component Value Date   TSH 0.95 09/09/2016     ASSESSMENT AND PLAN 53 year African-American male with left basal ganglia hemorrhage in February 2018 secondary to malignant hypertension.    PLAN: Continue  Aspirin .81mg  daily for secondary stroke prevention   blood  pressure goal 130/90 today's reading 138/70 continue blood pressure medications LDL cholesterol below 70 Hemoglobin A1c below 6.5 Moderate exercise by walking 30 minutes every day use cane at all times for safe ambulation Will discharge from stroke  clinic I spent 25 minutes in total face to face time with the patient more than 50% of which was spent counseling and coordination of care, reviewing test results reviewing medications and discussing and reviewing the diagnosis of stroke and management  of risk factors and further treatment options. Answered questions.  Written information for stroke prevention given for reference in the future and reviewed with patient and grandson.  [facility], Nilda RiggsNancy Carolyn Martin, [facility], [facility], [facility]PRN  [facility] [address], [address] [place], [place] [address] [phone]

## 2017-03-15 NOTE — Patient Instructions (Addendum)
Continue  Aspirin .81mg  daily for secondary stroke prevention   blood  pressure goal 130/90 today's reading 138/70 continue blood pressure medications LDL cholesterol below 70 Hemoglobin A1c below 6.5 Moderate exercise by walking 30 minutes every day use cane at all times for safe ambulation Will discharge from stroke  clinic Stroke Prevention Some health problems and behaviors may make it more likely for you to have a stroke. Below are ways to lessen your risk of having a stroke.  Be active for at least 30 minutes on most or all days.  Do not smoke. Try not to be around others who smoke.  Do not drink too much alcohol. ? Do not have more than 2 drinks a day if you are a man. ? Do not have more than 1 drink a day if you are a woman and are not pregnant.  Eat healthy foods, such as fruits and vegetables. If you were put on a specific diet, follow the diet as told.  Keep your cholesterol levels under control through diet and medicines. Look for foods that are low in saturated fat, trans fat, cholesterol, and are high in fiber.  If you have diabetes, follow all diet plans and take your medicine as told.  Ask your doctor if you need treatment to lower your blood pressure. If you have high blood pressure (hypertension), follow all diet plans and take your medicine as told by your doctor.  If you are 4318-74 years old, have your blood pressure checked every 3-5 years. If you are age 74 or older, have your blood pressure checked every year.  Keep a healthy weight. Eat foods that are low in calories, salt, saturated fat, trans fat, and cholesterol.  Do not take drugs.  Avoid birth control pills, if this applies. Talk to your doctor about the risks of taking birth control pills.  Talk to your doctor if you have sleep problems (sleep apnea).  Take all medicine as told by your doctor. ? You may be told to take aspirin or blood thinner medicine. Take this medicine as told by your  doctor. ? Understand your medicine instructions.  Make sure any other conditions you have are being taken care of.  Get help right away if:  You suddenly lose feeling (you feel numb) or have weakness in your face, arm, or leg.  Your face or eyelid hangs down to one side.  You suddenly feel confused.  You have trouble talking (aphasia) or understanding what people are saying.  You suddenly have trouble seeing in one or both eyes.  You suddenly have trouble walking.  You are dizzy.  You lose your balance or your movements are clumsy (uncoordinated).  You suddenly have a very bad headache and you do not know the cause.  You have new chest pain.  Your heart feels like it is fluttering or skipping a beat (irregular heartbeat). Do not wait to see if the symptoms above go away. Get help right away. Call your local emergency services (911 in [place] Do not drive yourself to the hospital. This information is not intended to replace advice given to you by your health care provider. Make sure you discuss any questions you have with your health care provider. Document Released: 08/18/2011 Document Revised: 07/25/2015 Document Reviewed: 08/19/2012 Elsevier Interactive Patient Education  [employer]2018 [employer] Inc.

## 2017-05-04 ENCOUNTER — Telehealth: Payer: Self-pay | Admitting: *Deleted

## 2017-05-04 NOTE — Telephone Encounter (Signed)
Received [facility]PT [facility]; forwarded to provider/SLS 03/05

## 2017-06-10 DIAGNOSIS — R7302 Impaired glucose tolerance (oral): Secondary | ICD-10-CM | POA: Insufficient documentation

## 2017-06-11 DIAGNOSIS — R809 Proteinuria, unspecified: Secondary | ICD-10-CM | POA: Insufficient documentation

## 2017-06-30 ENCOUNTER — Ambulatory Visit: Payer: Medicare Other | Admitting: *Deleted

## 2017-07-10 ENCOUNTER — Other Ambulatory Visit: Payer: Self-pay | Admitting: Family Medicine

## 2017-07-10 DIAGNOSIS — I10 Essential (primary) hypertension: Secondary | ICD-10-CM

## 2017-10-09 ENCOUNTER — Other Ambulatory Visit: Payer: Self-pay | Admitting: Family Medicine

## 2018-01-12 ENCOUNTER — Other Ambulatory Visit: Payer: Self-pay | Admitting: Family Medicine

## 2018-02-21 ENCOUNTER — Other Ambulatory Visit: Payer: Self-pay | Admitting: Family Medicine

## 2018-03-19 ENCOUNTER — Other Ambulatory Visit: Payer: Self-pay | Admitting: Family Medicine

## 2018-06-13 ENCOUNTER — Other Ambulatory Visit: Payer: Self-pay | Admitting: Family Medicine

## 2018-08-15 DIAGNOSIS — M5136 Other intervertebral disc degeneration, lumbar region: Secondary | ICD-10-CM | POA: Insufficient documentation

## 2018-10-09 ENCOUNTER — Other Ambulatory Visit: Payer: Self-pay | Admitting: Family Medicine

## 2018-10-14 ENCOUNTER — Other Ambulatory Visit: Payer: Self-pay | Admitting: Family Medicine

## 2018-11-09 IMAGING — CT CT RENAL STONE PROTOCOL
2 of 3 series · 16 of 46 positions shown, 18 images · non-contrast
Comparison: None.

CLINICAL DATA: Altered mental status pain in the abdomen increased
weakness

EXAM:
CT ABDOMEN AND PELVIS WITHOUT CONTRAST
TECHNIQUE: Multidetector CT imaging of the abdomen and pelvis was performed
following the standard protocol without IV contrast.

[Series 3: lung · axial · 0.71mm/px · z∈[-513,-445]mm · 13 of 40 slices shown, 15 images]
[im 3/40  soft-tissue]
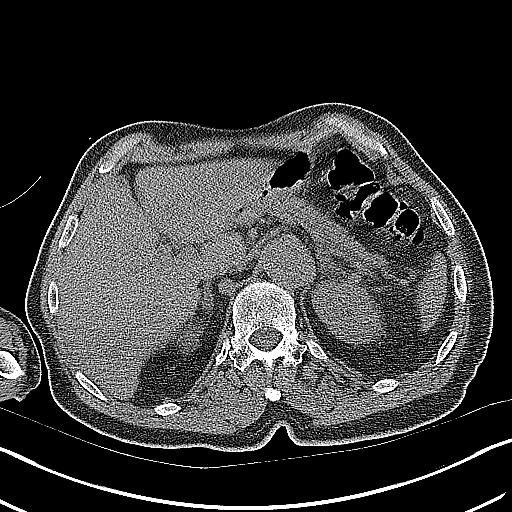
[im 3/40  bone]
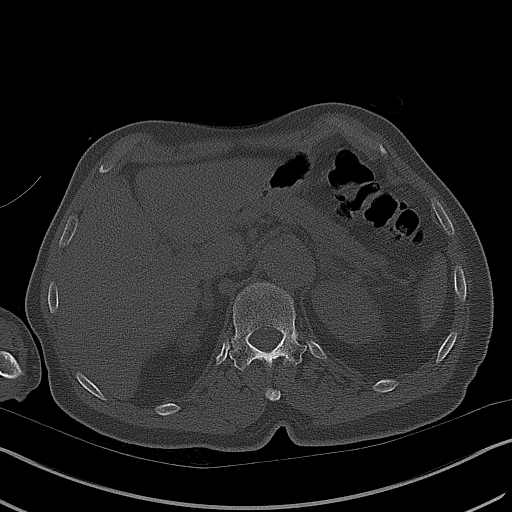
[im 6/40  soft-tissue]
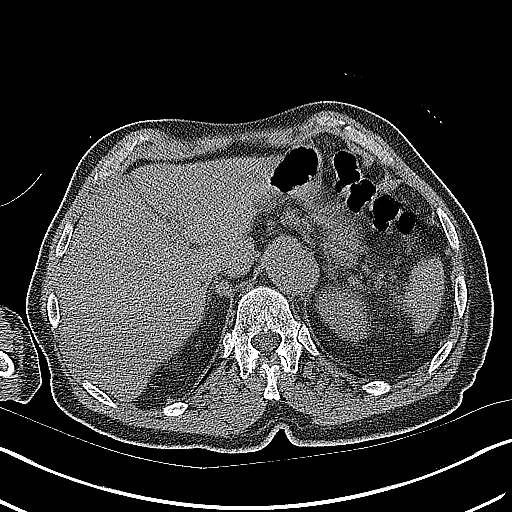
[im 8/40  soft-tissue]
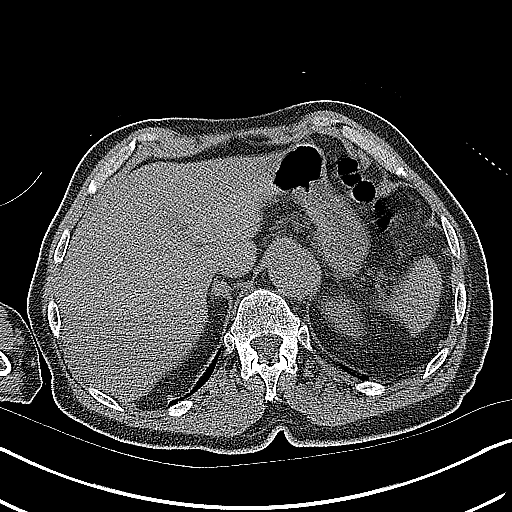
[im 12/40  soft-tissue]
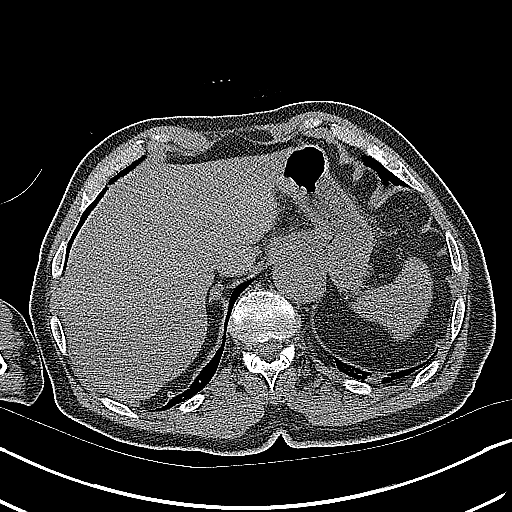
[im 14/40  soft-tissue]
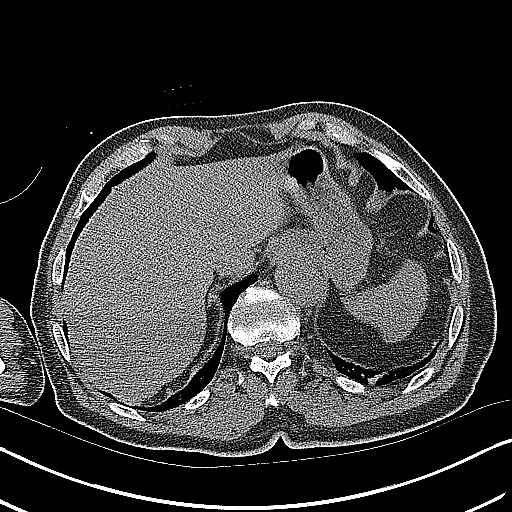
[im 17/40  soft-tissue]
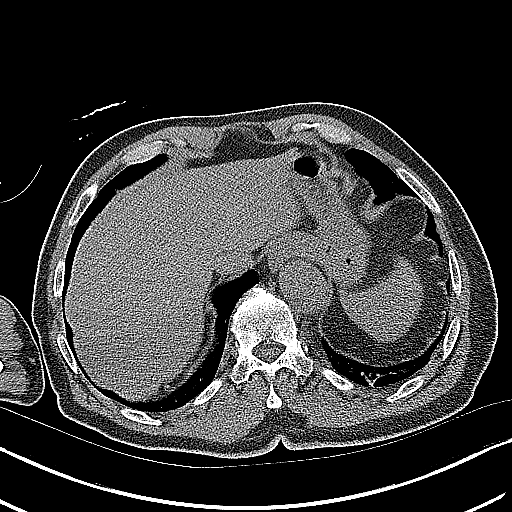
[im 21/40  soft-tissue]
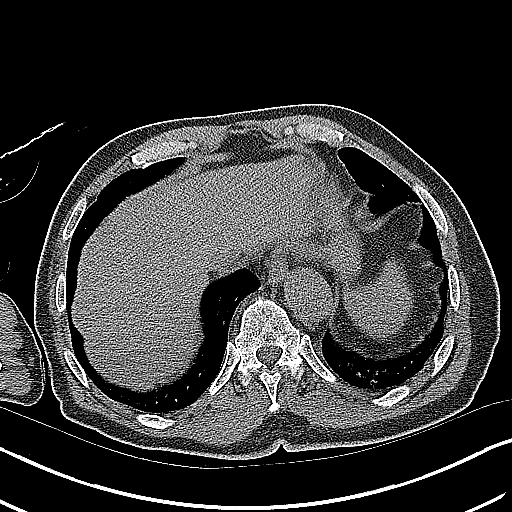
[im 23/40  soft-tissue]
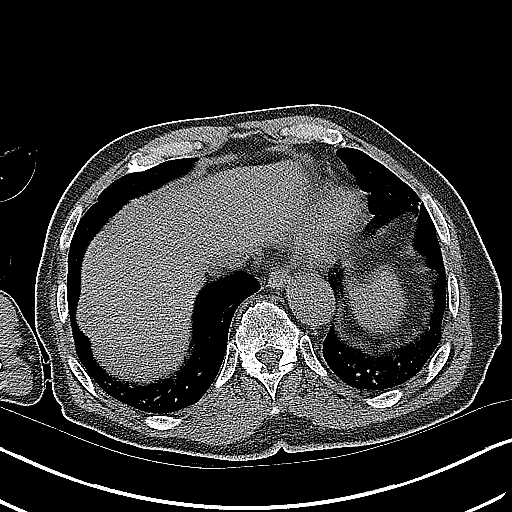
[im 26/40  soft-tissue]
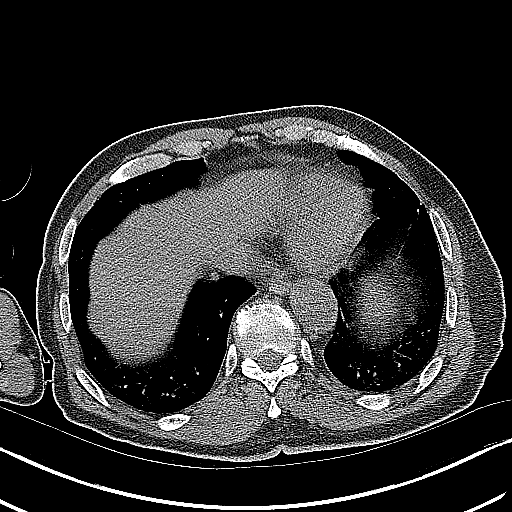
[im 26/40  bone]
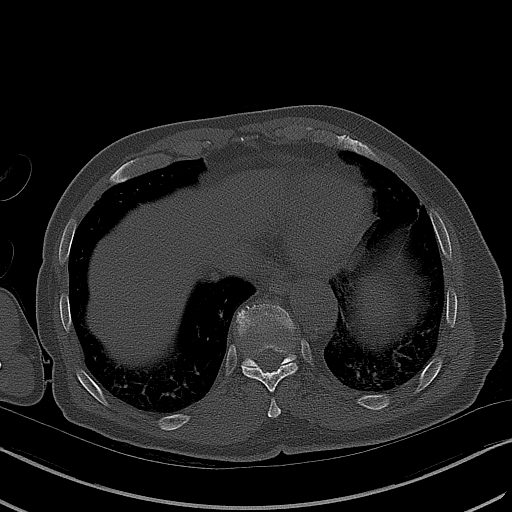
[im 28/40  soft-tissue]
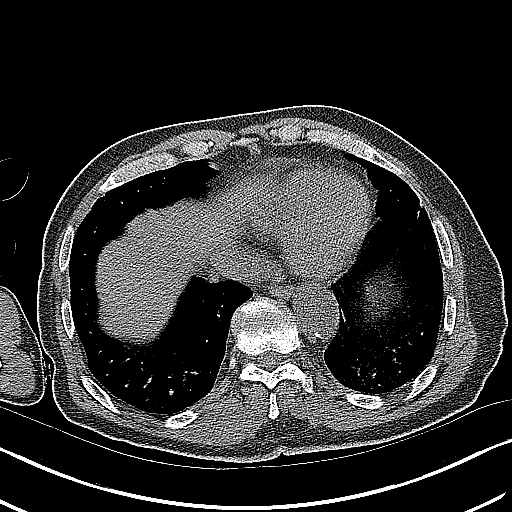
[im 32/40  soft-tissue]
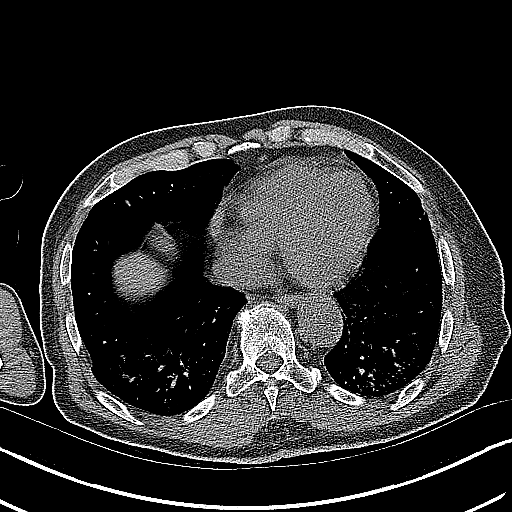
[im 34/40  soft-tissue]
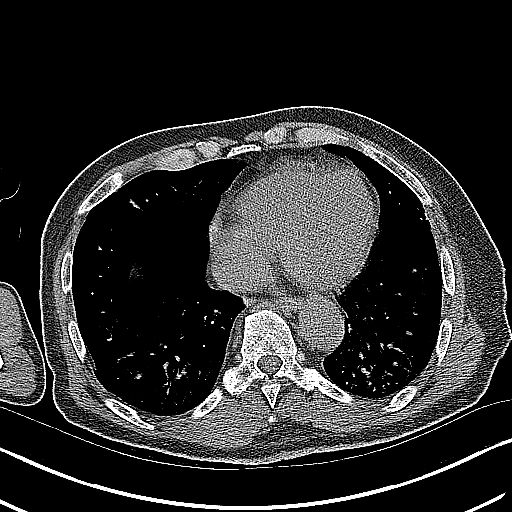
[im 37/40  soft-tissue]
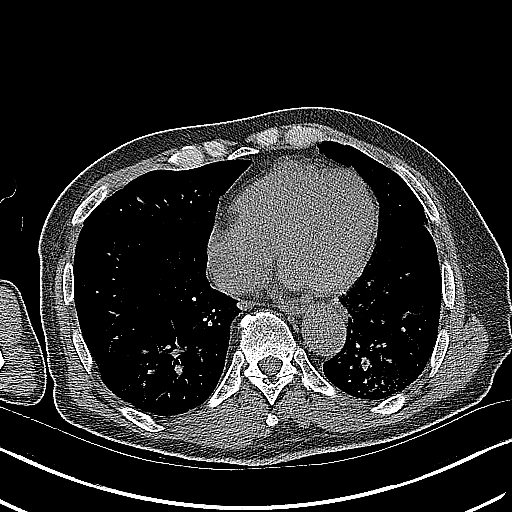

[Series 4: coronal · coronal · 0.69mm/px · 3 of 114 slices shown]
[im 38/114  soft-tissue]
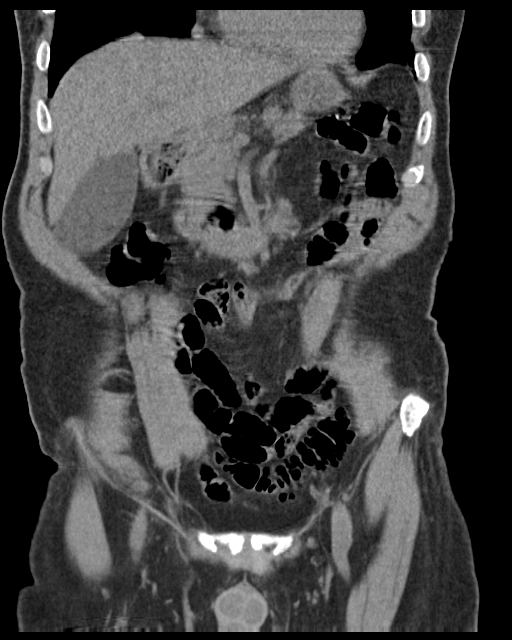
[im 51/114  soft-tissue]
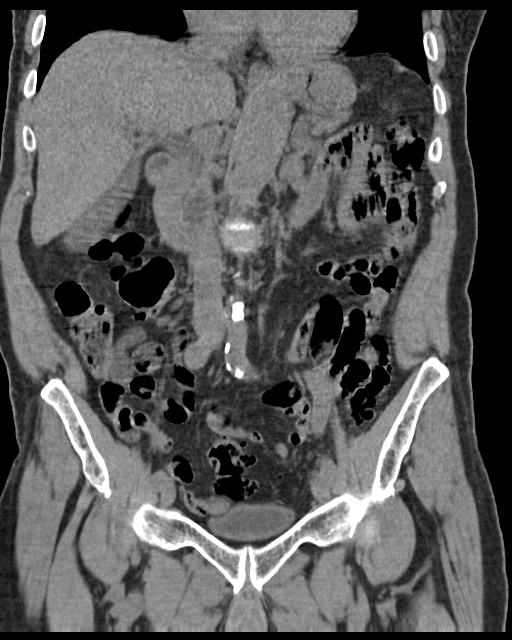
[im 63/114  soft-tissue]
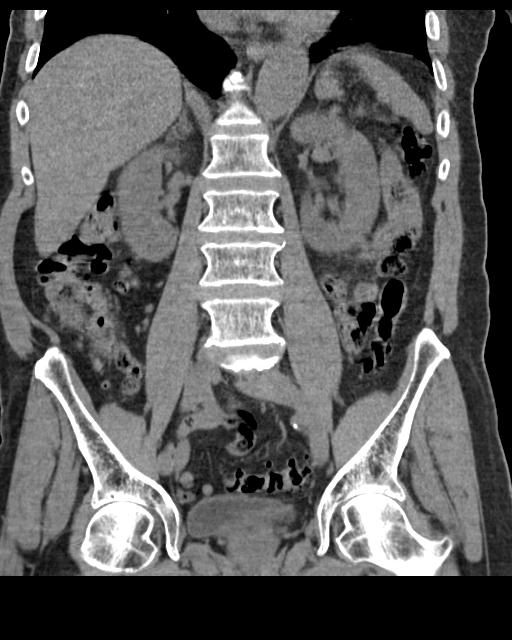

[16 of 46 positions shown; findings below may reference images not displayed]

FINDINGS: Lower chest: Lung bases demonstrate linear atelectasis or scarring
in the right middle lobe and lung bases. No consolidation or
effusion. Heart size upper normal.

Hepatobiliary: No biliary dilatation. No focal hepatic abnormality.
Prominent gallbladder fundus. No calcified stones.

Pancreas: Unremarkable. No pancreatic ductal dilatation or
surrounding inflammatory changes.

Spleen: Normal in size without focal abnormality.

Adrenals/Urinary Tract: Adrenal glands are unremarkable. Kidneys are
normal, without renal calculi, focal lesion, or hydronephrosis.
Bladder is unremarkable.

Stomach/Bowel: Stomach is within normal limits. Appendix appears
normal. No evidence of bowel wall thickening, distention, or
inflammatory changes. Extensive sigmoid colon diverticular disease
without acute inflammation.

Vascular/Lymphatic: Aortic atherosclerosis. No enlarged abdominal or
pelvic lymph nodes.

Reproductive: Slightly enlarged prostate

Other: No free air or free fluid.  Small fat in the umbilicus.

Musculoskeletal: Degenerative changes of the spine. No acute or
suspicious bone lesion.
IMPRESSION: 1. No definite CT evidence for acute intra-abdominal or pelvic
pathology. Negative for kidney stone, hydronephrosis or ureteral
stone.
2. Extensive sigmoid colon diverticular disease without acute
inflammation.

## 2018-11-09 IMAGING — CT CT HEAD W/O CM
3 of 4 series · 15 of 47 positions shown, 18 images · non-contrast
Comparison: Head CT dated 06/16/2016.

CLINICAL DATA: Altered mental status, decreased level of
consciousness. Abdominal pain.

EXAM:
CT HEAD WITHOUT CONTRAST
TECHNIQUE: Contiguous axial images were obtained from the base of the skull
through the vertex without intravenous contrast.

[Series 2: head w/o · axial · non-contrast · 0.43mm/px · z∈[-140,-20]mm · 9 of 30 slices shown, 12 images]
[im 3/30  brain]
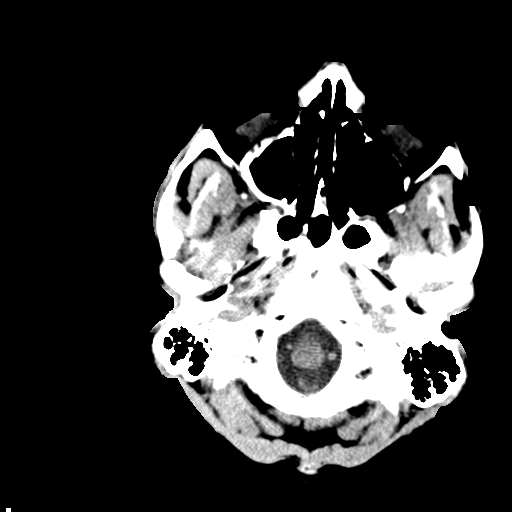
[im 3/30  bone]
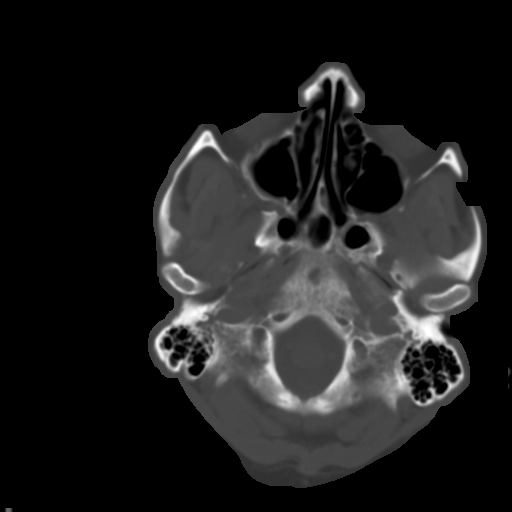
[im 7/30  brain]
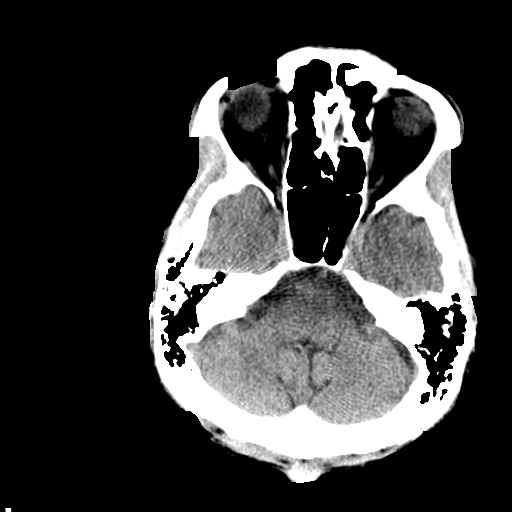
[im 9/30  brain]
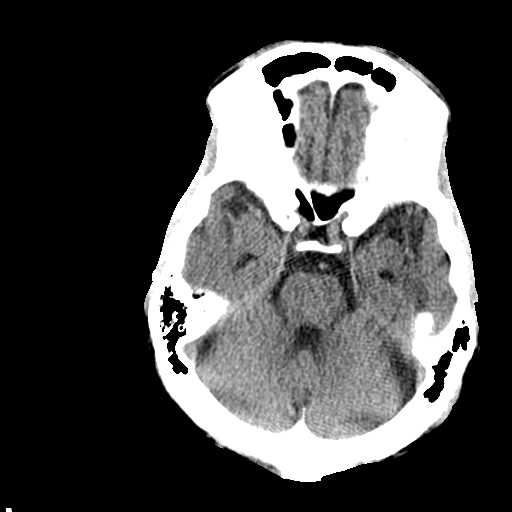
[im 13/30  brain]
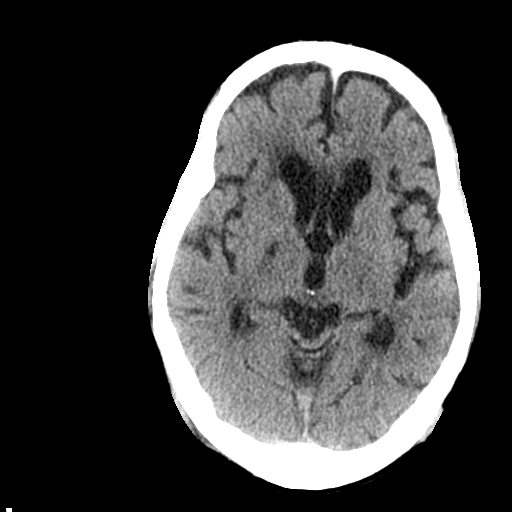
[im 15/30  brain]
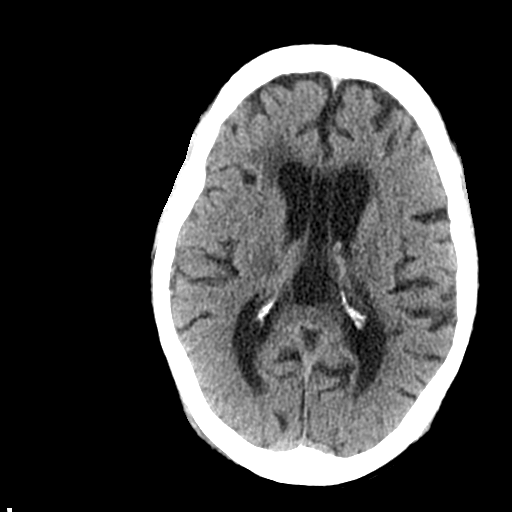
[im 15/30  bone]
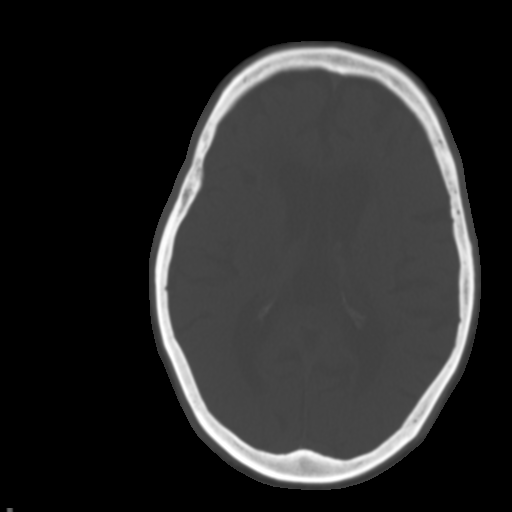
[im 17/30  brain]
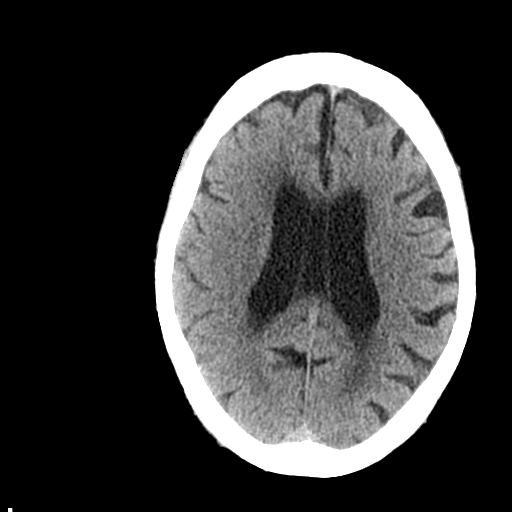
[im 21/30  brain]
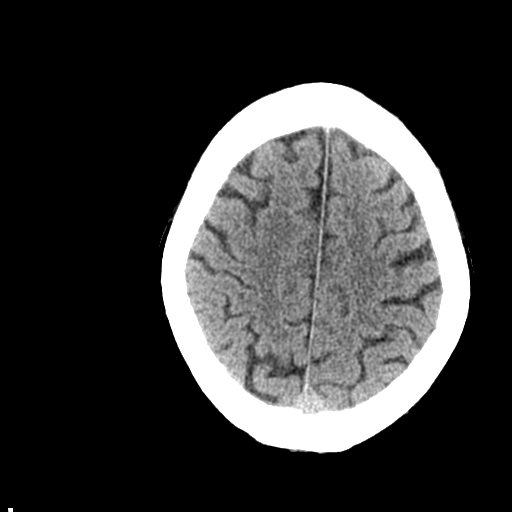
[im 23/30  brain]
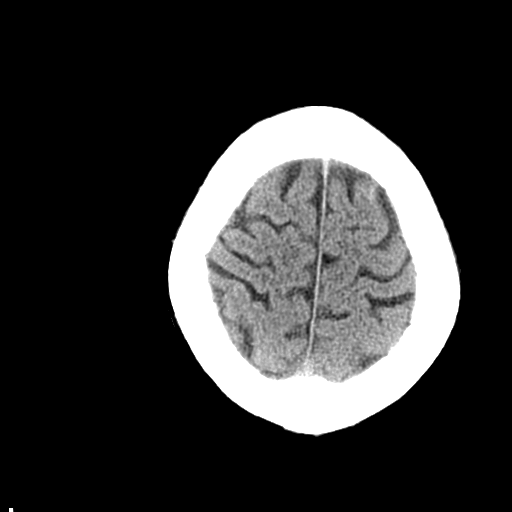
[im 27/30  brain]
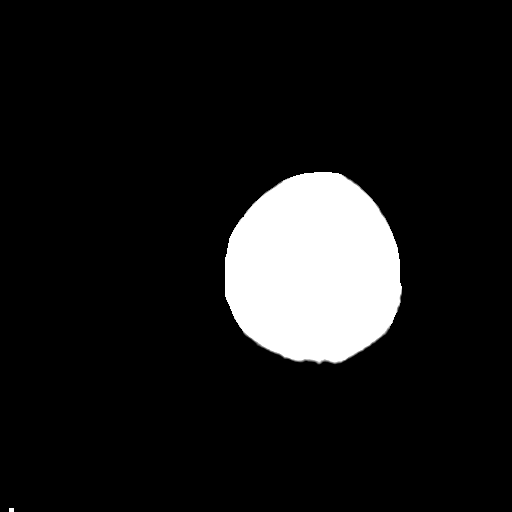
[im 27/30  bone]
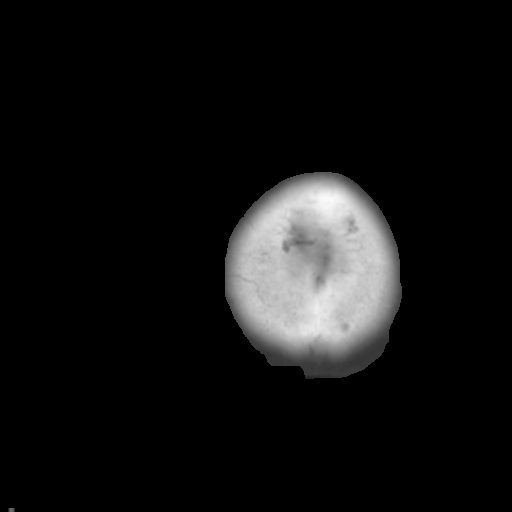

[Series 5: coronal · coronal · 0.28mm/px · 3 of 67 slices shown]
[im 23/67  brain]
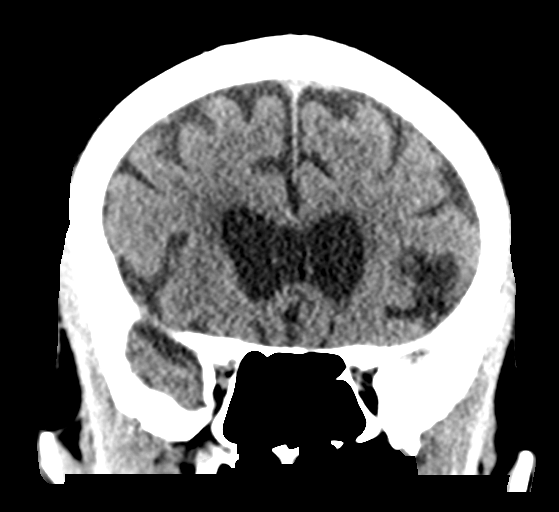
[im 30/67  brain]
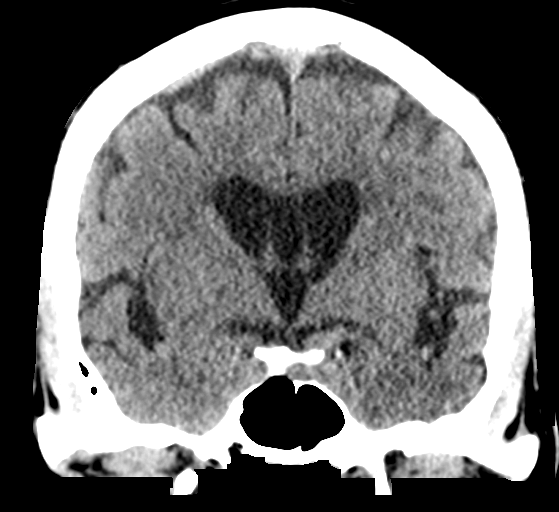
[im 37/67  brain]
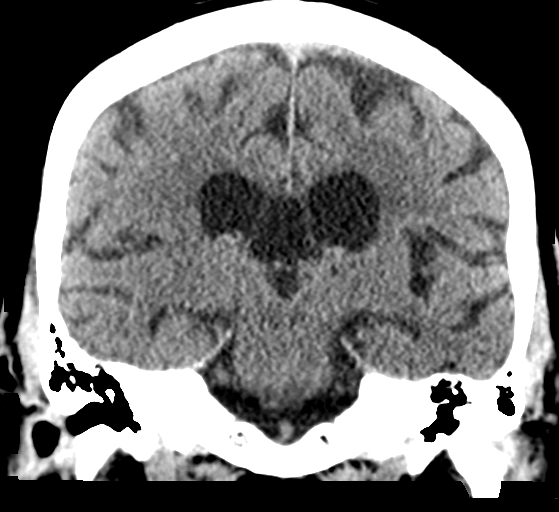

[Series 6: sagittal · sagittal · 0.27mm/px · 3 of 49 slices shown]
[im 17/49  brain]
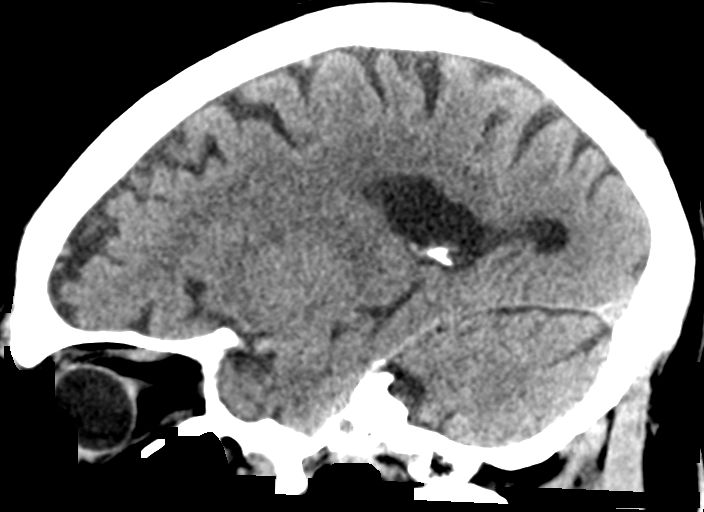
[im 25/49  brain]
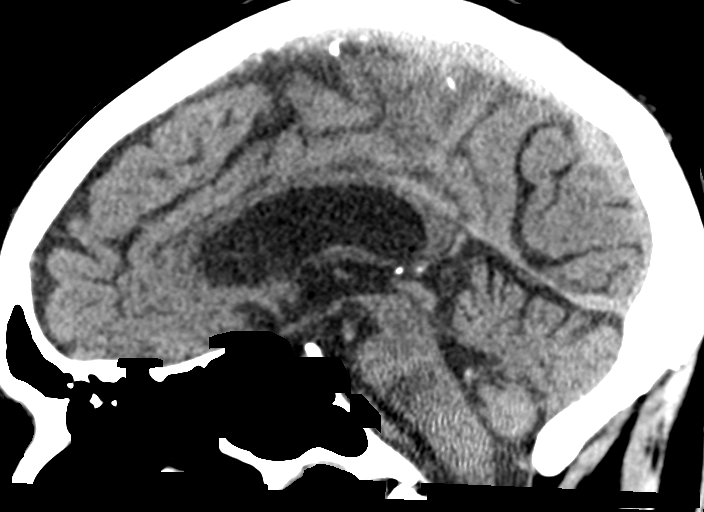
[im 33/49  brain]
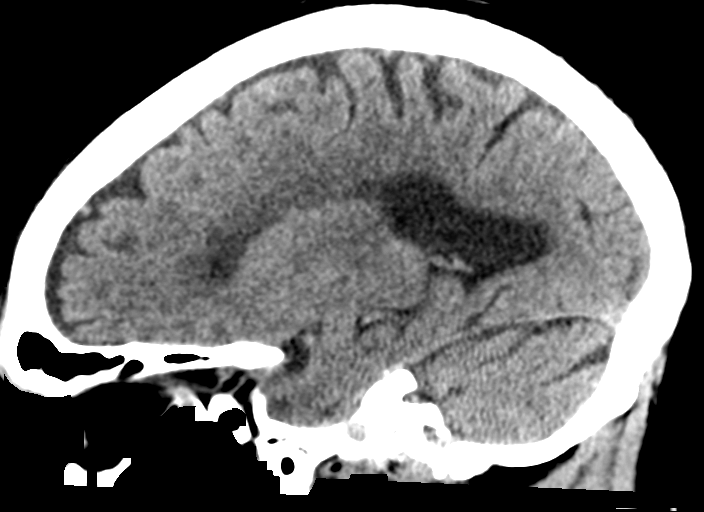

[15 of 47 positions shown; findings below may reference images not displayed]

FINDINGS: Brain: Mild generalized parenchymal atrophy with commensurate
dilatation of the ventricles and sulci. Mild chronic small vessel
ischemic changes within the bilateral periventricular white matter.
Small old lacunar infarcts within the basal ganglia regions
bilaterally.

There is no mass, hemorrhage, edema or other evidence of acute
parenchymal abnormality. No extra-axial hemorrhage.

Vascular: No hyperdense vessel or unexpected calcification.

Skull: Normal. Negative for fracture or focal lesion.

Sinuses/Orbits: No acute finding.

Other: None.
IMPRESSION: 1. No acute findings.  No intracranial mass, hemorrhage or edema.
2. Chronic ischemic changes, as detailed above.

## 2018-11-09 IMAGING — CR DG CHEST 2V
3 series · 3 of 3 positions shown · non-contrast
Comparison: 04/14/2016

CLINICAL DATA: Altered mental status

EXAM:
CHEST  2 VIEW

[w chest lat]
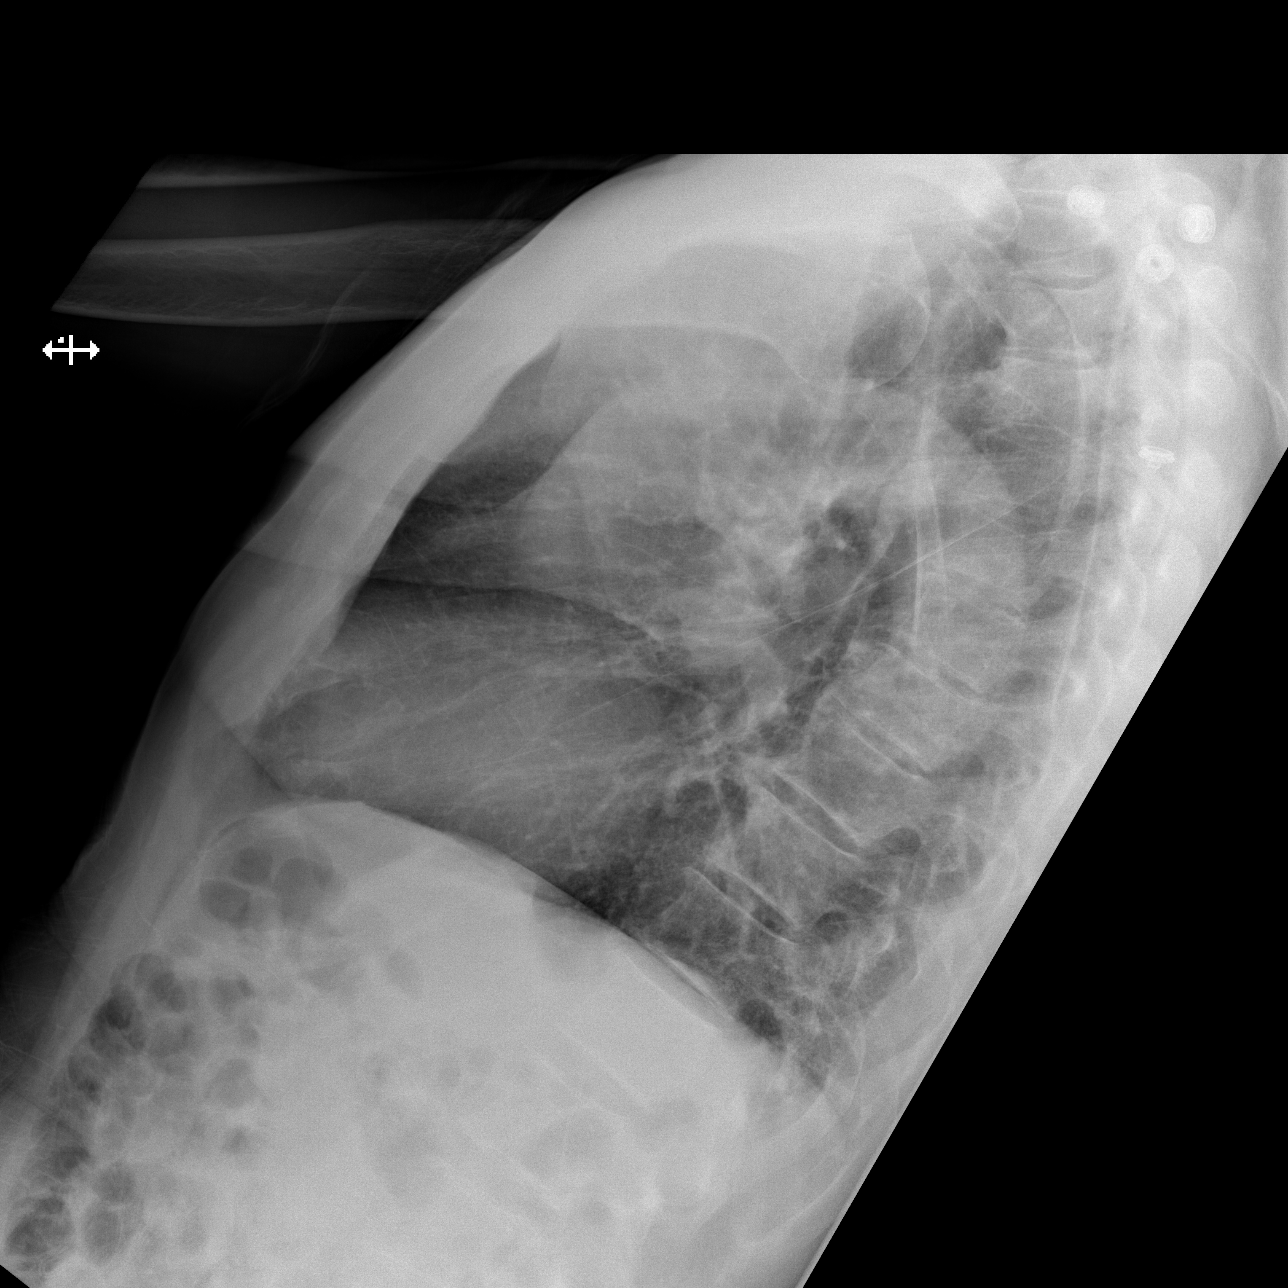

[x chest ap (1 of 2)]
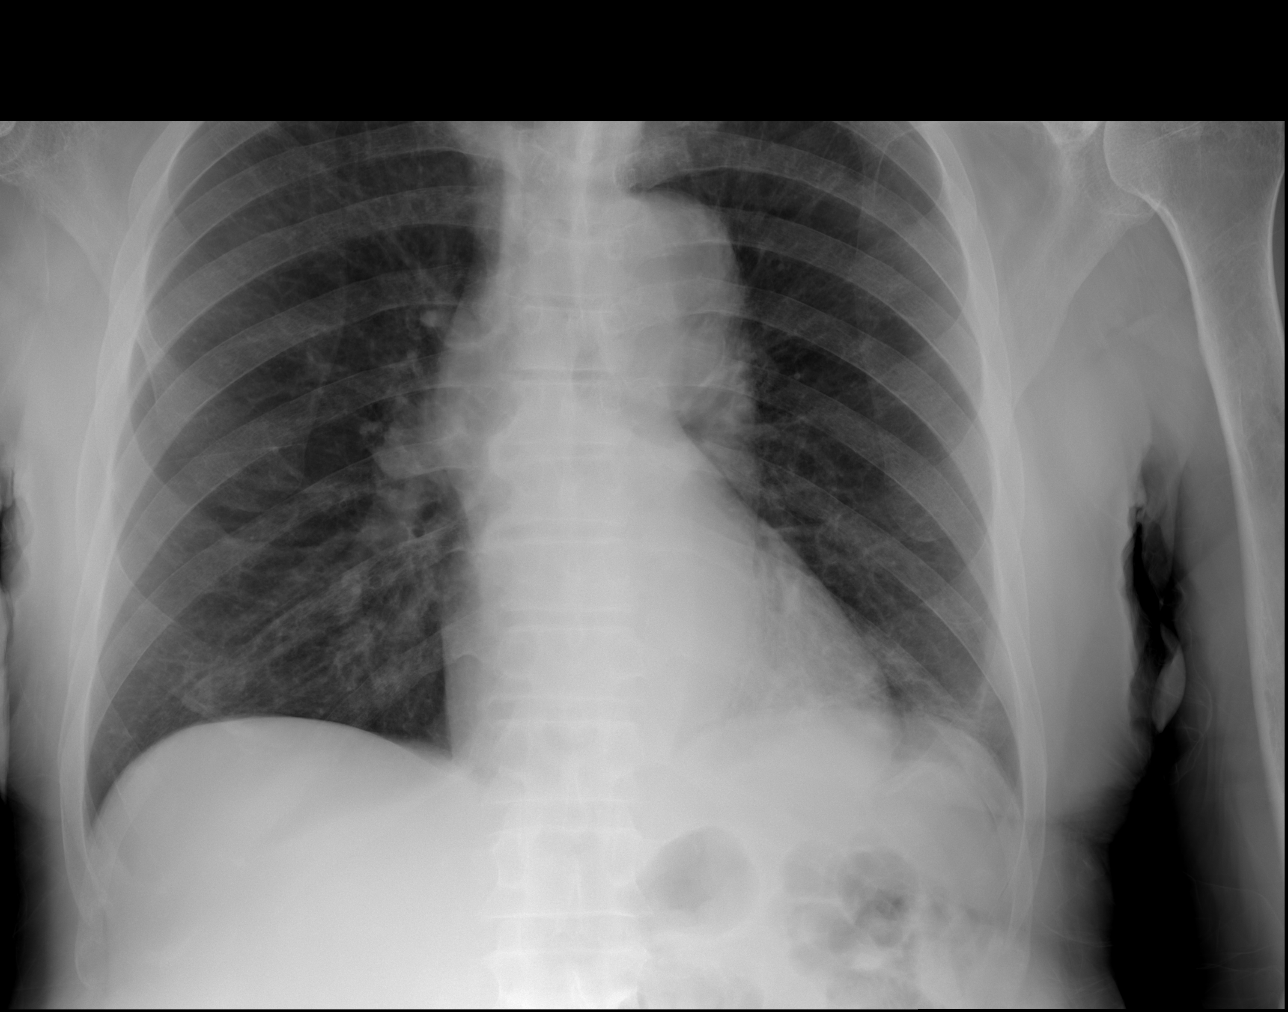

[x chest ap (2 of 2)]
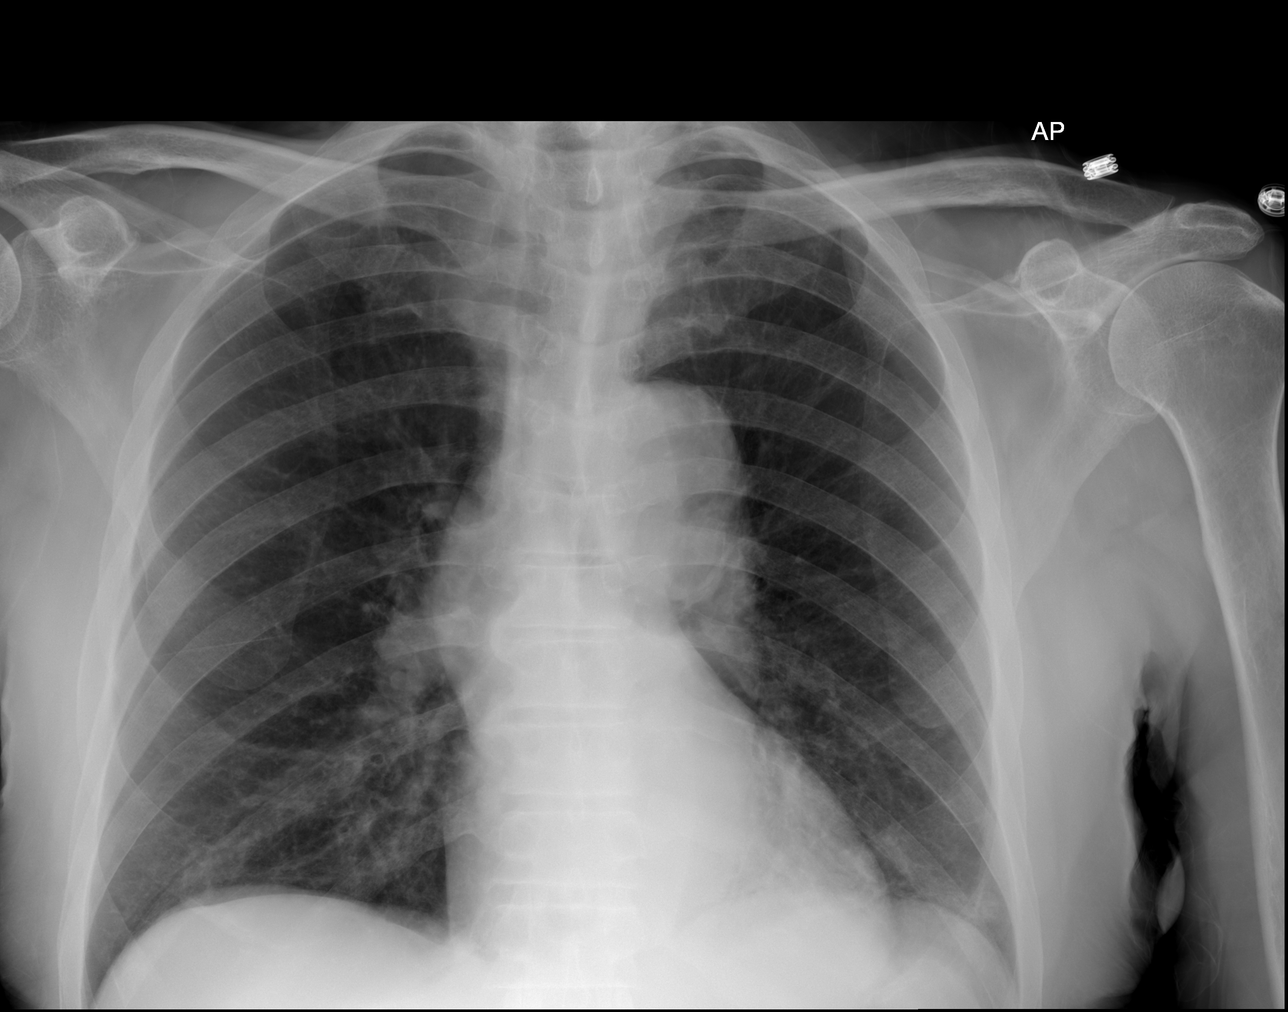

[3 of 3 positions shown; findings below may reference images not displayed]

FINDINGS: Normal heart size. Stable aortic tortuosity. Mild streaky density at
the left base. No edema, air bronchogram, effusion, or pneumothorax.
IMPRESSION: Mild atelectasis at the left base.

## 2020-03-08 DIAGNOSIS — R351 Nocturia: Secondary | ICD-10-CM | POA: Insufficient documentation

## 2020-03-08 DIAGNOSIS — R3129 Other microscopic hematuria: Secondary | ICD-10-CM | POA: Insufficient documentation

## 2020-08-01 ENCOUNTER — Ambulatory Visit (HOSPITAL_COMMUNITY)
Admission: EM | Admit: 2020-08-01 | Discharge: 2020-08-01 | Disposition: A | Discharge status: Home / Self Care | Payer: Medicare PPO | Attending: Emergency Medicine | Admitting: Emergency Medicine

## 2020-08-01 ENCOUNTER — Ambulatory Visit (INDEPENDENT_AMBULATORY_CARE_PROVIDER_SITE_OTHER): Payer: Medicare PPO

## 2020-08-01 ENCOUNTER — Other Ambulatory Visit: Payer: Self-pay

## 2020-08-01 ENCOUNTER — Encounter (HOSPITAL_COMMUNITY): Payer: Self-pay | Admitting: Emergency Medicine

## 2020-08-01 ENCOUNTER — Telehealth: Payer: Self-pay | Admitting: Family Medicine

## 2020-08-01 DIAGNOSIS — I509 Heart failure, unspecified: Secondary | ICD-10-CM | POA: Insufficient documentation

## 2020-08-01 DIAGNOSIS — R600 Localized edema: Secondary | ICD-10-CM | POA: Diagnosis not present

## 2020-08-01 DIAGNOSIS — R609 Edema, unspecified: Secondary | ICD-10-CM

## 2020-08-01 LAB — COMPREHENSIVE METABOLIC PANEL
ALT: 16 U/L (ref 0–44)
AST: 20 U/L (ref 15–41)
Albumin: 4.2 g/dL (ref 3.5–5.0)
Alkaline Phosphatase: 58 U/L (ref 38–126)
Anion gap: 6 (ref 5–15)
BUN: 17 mg/dL (ref 8–23)
CO2: 23 mmol/L (ref 22–32)
Calcium: 9.2 mg/dL (ref 8.9–10.3)
Chloride: 109 mmol/L (ref 98–111)
Creatinine, Ser: 1.59 mg/dL — ABNORMAL HIGH (ref 0.61–1.24)
GFR, Estimated: 44 mL/min — ABNORMAL LOW (ref >60)
Glucose, Bld: 99 mg/dL (ref 70–99)
Potassium: 4.6 mmol/L (ref 3.5–5.1)
Sodium: 138 mmol/L (ref 135–145)
Total Bilirubin: 0.7 mg/dL (ref 0.3–1.2)
Total Protein: 8.1 g/dL (ref 6.5–8.1)

## 2020-08-01 LAB — CBC WITH DIFFERENTIAL/PLATELET
Abs Immature Granulocytes: 0.01 10*3/uL (ref 0.00–0.07)
Basophils Absolute: 0 10*3/uL (ref 0.0–0.1)
Basophils Relative: 1 %
Eosinophils Absolute: 0.1 10*3/uL (ref 0.0–0.5)
Eosinophils Relative: 1 %
HCT: 33.7 % — ABNORMAL LOW (ref 39.0–52.0)
Hemoglobin: 10.1 g/dL — ABNORMAL LOW (ref 13.0–17.0)
Immature Granulocytes: 0 %
Lymphocytes Relative: 49 %
Lymphs Abs: 2 10*3/uL (ref 0.7–4.0)
MCH: 24.2 pg — ABNORMAL LOW (ref 26.0–34.0)
MCHC: 30 g/dL (ref 30.0–36.0)
MCV: 80.8 fL (ref 80.0–100.0)
Monocytes Absolute: 0.5 10*3/uL (ref 0.1–1.0)
Monocytes Relative: 12 %
Neutro Abs: 1.6 10*3/uL — ABNORMAL LOW (ref 1.7–7.7)
Neutrophils Relative %: 37 %
Platelets: 385 10*3/uL (ref 150–400)
RBC: 4.17 MIL/uL — ABNORMAL LOW (ref 4.22–5.81)
RDW: 17 % — ABNORMAL HIGH (ref 11.5–15.5)
WBC: 4.2 10*3/uL (ref 4.0–10.5)
nRBC: 0 % (ref 0.0–0.2)

## 2020-08-01 LAB — BRAIN NATRIURETIC PEPTIDE: B Natriuretic Peptide: 19.8 pg/mL (ref 0.0–100.0)

## 2020-08-01 MED ORDER — FUROSEMIDE 20 MG PO TABS: 40.0000 mg | ORAL_TABLET | Freq: Every day | ORAL | 1 refills | Status: DC

## 2020-08-01 NOTE — ED Triage Notes (Signed)
Bilateral lower leg edema for 2 weeks.  Denies swelling in the past.  Denies medicine changes

## 2020-08-01 NOTE — ED Notes (Signed)
Andrew Cuevas [phone].  Number is listed as mobile number in patient demographics

## 2020-08-01 NOTE — ED Provider Notes (Signed)
[facility]    CSN: [id number] Arrival date & time: 08/01/20  1053      History   Chief Complaint Chief Complaint  Patient presents with  . Leg Swelling    HPI Andrew Cuevas is a 75 y.o. male.   Pt states that he has had increase swelling to bil lower feet getting worse over the past week. Denies any sob, no chest pain, no abd pain, no n/v/d.no cough. Alert x4 pt has a hx of kidney, heart failure and hep c. Has not had labs for a "while". Pt is voiding. Elevated feet with no relief. Denies any bloody  Stools that he knows of.       Past Medical History:  Diagnosis Date  . Chronic headaches   . CVA (cerebral vascular accident) (HCC)   . HTN (hypertension), malignant     Patient Active Problem List   Diagnosis Date Noted  . Special screening for malignant neoplasms, colon 08/18/2016  . Failure to thrive in adult 07/15/2016  . Acute renal failure (ARF) (HCC) 07/14/2016  . Reactive depression   . Infiltrate noted on imaging study   . Cough   . Abnormal chest x-ray   . Stage 3 chronic kidney disease (HCC)   . Nontraumatic acute hemorrhage of basal ganglia (HCC) 04/08/2016  . Vitamin B 12 deficiency   . Cocaine abuse (HCC)   . Right hemiparesis (HCC)   . Hemorrhagic stroke (HCC)   . Left hydrocele   . Tobacco abuse   . Diastolic dysfunction   . Stage 2 chronic kidney disease   . Benign essential HTN   . Chronic nonintractable headache   . History of CVA with residual deficit   . Acute blood loss anemia   . Intractable hiccups   . ICH (intracerebral hemorrhage) (HCC)   . Stroke (cerebrum) (HCC) 04/02/2016  . Hypertensive heart disease without heart failure 03/28/2016    Past Surgical History:  Procedure Laterality Date  . HERNIA REPAIR Bilateral   . HYDROCELE EXCISION Left 07/02/2016   Procedure: HYDROCELECTOMY ADULT;  Surgeon: Bjorn Pippin, MD;  Location: [facility];  Service: Urology;  Laterality: Left;       Home Medications    Prior to Admission  medications   Medication Sig Start Date End Date Taking? Authorizing Provider  amLODipine (NORVASC) 5 MG tablet TAKE 1 TABLET BY MOUTH EVERY DAY 07/12/17  Yes Sharlene Dory, DO  aspirin EC 81 MG tablet Take 81 mg by mouth daily.   Yes [provider]  cloNIDine (CATAPRES) 0.1 MG tablet Take 1 tablet (0.1 mg total) by mouth 2 (two) times daily. 04/17/16  Yes Love, Evlyn Kanner, PA-C  furosemide (LASIX) 20 MG tablet Take 2 tablets (40 mg total) by mouth daily. 08/01/20  Yes Maple Mirza L, NP  HARVONI 90-400 MG TABS daily. 03/15/17 X 12 weeks 03/09/17  Yes [provider]  losartan (COZAAR) 100 MG tablet TAKE 1 TABLET BY MOUTH EVERY DAY 06/13/18  Yes Sharlene Dory, DO  Magnesium 400 MG CAPS Take by mouth.   Yes [provider]  feeding supplement, ENSURE ENLIVE, (ENSURE ENLIVE) LIQD Take 237 mLs by mouth 2 (two) times daily between meals. 07/17/16   Mikhail, Nita Sells, DO  LOSARTAN POTASSIUM PO Take 100 mg by mouth daily.    [provider]  omeprazole (PRILOSEC) 20 MG capsule Take 1 capsule (20 mg total) by mouth 2 (two) times daily before a meal. Patient not taking: Reported on 03/15/2017  06/29/16   Sharlene Dory, DO    Family History Family History  Problem Relation Age of Onset  . Hypertension Mother   . Arthritis Mother   . Alzheimer's disease Father   . Stroke Sister     Social History Social History   Tobacco Use  . Smoking status: Former Smoker    Packs/day: 0.50    Types: Cigarettes    Quit date: 04/27/2016    Years since quitting: 4.2  . Smokeless tobacco: Never Used  Vaping Use  . Vaping Use: Never used  Substance Use Topics  . Alcohol use: No  . Drug use: No    Types: "Crack" cocaine     Allergies   Patient has no known allergies.   Review of Systems Review of Systems  Constitutional: Negative for activity change and fever.  Respiratory: Negative for cough and shortness of breath.   Cardiovascular:  Positive for leg swelling. Negative for chest pain and palpitations.  Gastrointestinal: Negative for abdominal pain, anal bleeding, blood in stool, constipation, diarrhea, nausea and vomiting.  Genitourinary: Negative.   Neurological: Negative.      Physical Exam Triage Vital Signs ED Triage Vitals  Enc Vitals Group     BP 08/01/20 1132 (!) 147/86     Pulse Rate 08/01/20 1132 86     Resp 08/01/20 1132 20     Temp 08/01/20 1132 (!) 97.5 F (36.4 C)     Temp Source 08/01/20 1132 Oral     SpO2 08/01/20 1132 96 %     Weight --      Height --      Head Circumference --      Peak Flow --      Pain Score 08/01/20 1129 6     Pain Loc --      Pain Edu? --      Excl. in GC? --    No data found.  Updated Vital Signs BP (!) 147/86 (BP Location: Left Arm)   Pulse 86   Temp (!) 97.5 F (36.4 C) (Oral)   Resp 20   SpO2 96%   Visual Acuity     Physical Exam Constitutional:      Appearance: Normal appearance.  Cardiovascular:     Rate and Rhythm: Normal rate.  Pulmonary:     Effort: Pulmonary effort is normal.  Abdominal:     General: Bowel sounds are normal.  Musculoskeletal:        General: Swelling present.     Comments: +3 pitting edema to bil lower feet. None noted to lower leg or calf area. Warm to palpation.   Skin:    Coloration: Skin is jaundiced.     Comments: Slight yellow appearance to conjunctiva and skin tone. Hx of hep c,   Neurological:     Mental Status: He is alert and oriented to person, place, and time.      UC Treatments / Results  Labs (all labs ordered are listed, but only abnormal results are displayed) Labs Reviewed  CBC WITH DIFFERENTIAL/PLATELET - Abnormal; Notable for the following components:      Result Value   RBC 4.17 (*)    Hemoglobin 10.1 (*)    HCT 33.7 (*)    MCH 24.2 (*)    RDW 17.0 (*)    Neutro Abs 1.6 (*)    All other components within normal limits  COMPREHENSIVE METABOLIC PANEL - Abnormal; Notable for the following  components:   Creatinine, Ser  1.59 (*)    GFR, Estimated 44 (*)    All other components within normal limits  BRAIN NATRIURETIC PEPTIDE    EKG   Radiology DG Chest 2 View  Result Date: 08/01/2020 CLINICAL DATA:  Bilateral lower extremity edema for 2 weeks. History of congestive heart failure and renal failure. EXAM: CHEST - 2 VIEW COMPARISON:  PA and lateral chest 07/14/2016. FINDINGS: The chest is hyperexpanded. There is mild coarsening of the pulmonary interstitium in the bases. There is no evidence of pulmonary edema, consolidative process, pneumothorax or effusion. No acute or focal bony abnormality. IMPRESSION: No acute disease. The lungs appear emphysematous. Electronically Signed   By: Drusilla Kanner M.D.   On: 08/01/2020 12:09    Procedures Procedures (including critical care time)  Medications Ordered in UC Medications - No data to display  Initial Impression / Assessment and Plan / UC Course  I have reviewed the triage vital signs and the nursing notes.  Pertinent labs & imaging results that were available during my care of the patient were reviewed by me and considered in my medical decision making (see chart for details).     Discussed with pt that we could draw labs and if elevated he will need to go to the er or if he begins to have chest pain or sob.  Reviewed previous labs last in chart in 2018.  Chest x ray does not show effusion  Pt understands the plan of care.  Needs to elevate   Spoke to md lamptey about pt ok to give lasix 40 mg  Pt will need to follow up with pcp  Called pt and his brother discussed labs and that he should follow up with pcp within the week   Final Clinical Impressions(s) / UC Diagnoses   Final diagnoses:  Peripheral edema     Discharge Instructions     Discussed with pt that we could draw labs and if elevated he will need to go to the er or if he begins to have chest pain or sob.  Staff will call pt with results  Chest x ray  does not show effusion  Pt understands the plan of care.  Needs to elevate     ED Prescriptions    Medication Sig Dispense Auth. Provider   furosemide (LASIX) 20 MG tablet Take 2 tablets (40 mg total) by mouth daily. 30 tablet Coralyn Mark, NP     PDMP not reviewed this encounter.   Coralyn Mark, NP 08/01/20 1354

## 2020-08-01 NOTE — Discharge Instructions (Addendum)
Discussed with pt that we could draw labs and if elevated he will need to go to the er or if he begins to have chest pain or sob.  Staff will call pt with results  Chest x ray does not show effusion  Pt understands the plan of care.  Needs to elevate

## 2020-08-01 NOTE — Telephone Encounter (Signed)
Patient has moved back to town and would like to re-establish care with you.

## 2020-08-05 NOTE — Telephone Encounter (Signed)
Appt schedule with pt 

## 2020-08-19 ENCOUNTER — Encounter: Payer: Self-pay | Admitting: Family Medicine

## 2020-08-19 ENCOUNTER — Ambulatory Visit (INDEPENDENT_AMBULATORY_CARE_PROVIDER_SITE_OTHER): Payer: Medicare PPO | Admitting: Family Medicine

## 2020-08-19 ENCOUNTER — Other Ambulatory Visit: Payer: Self-pay

## 2020-08-19 VITALS — BP 112/72 | HR 78 | Temp 98.2°F | Ht 69.0 in | Wt 230.0 lb

## 2020-08-19 DIAGNOSIS — I6930 Unspecified sequelae of cerebral infarction: Secondary | ICD-10-CM | POA: Diagnosis not present

## 2020-08-19 DIAGNOSIS — I69951 Hemiplegia and hemiparesis following unspecified cerebrovascular disease affecting right dominant side: Secondary | ICD-10-CM

## 2020-08-19 DIAGNOSIS — R5381 Other malaise: Secondary | ICD-10-CM

## 2020-08-19 DIAGNOSIS — B182 Chronic viral hepatitis C: Secondary | ICD-10-CM | POA: Diagnosis not present

## 2020-08-19 DIAGNOSIS — I10 Essential (primary) hypertension: Secondary | ICD-10-CM

## 2020-08-19 LAB — COMPREHENSIVE METABOLIC PANEL
ALT: 10 U/L (ref 0–53)
AST: 15 U/L (ref 0–37)
Albumin: 4.6 g/dL (ref 3.5–5.2)
Alkaline Phosphatase: 63 U/L (ref 39–117)
BUN: 20 mg/dL (ref 6–23)
CO2: 21 mEq/L (ref 19–32)
Calcium: 9.3 mg/dL (ref 8.4–10.5)
Chloride: 106 mEq/L (ref 96–112)
Creatinine, Ser: 1.5 mg/dL (ref 0.40–1.50)
GFR: 44.77 mL/min — ABNORMAL LOW (ref >60.00)
Glucose, Bld: 88 mg/dL (ref 70–99)
Potassium: 4.3 mEq/L (ref 3.5–5.1)
Sodium: 138 mEq/L (ref 135–145)
Total Bilirubin: 0.5 mg/dL (ref 0.2–1.2)
Total Protein: 8 g/dL (ref 6.0–8.3)

## 2020-08-19 LAB — LIPID PANEL
Cholesterol: 168 mg/dL (ref 0–200)
HDL: 35 mg/dL — ABNORMAL LOW (ref >39.00)
LDL Cholesterol: 106 mg/dL — ABNORMAL HIGH (ref 0–99)
NonHDL: 132.68
Total CHOL/HDL Ratio: 5
Triglycerides: 132 mg/dL (ref 0.0–149.0)
VLDL: 26.4 mg/dL (ref 0.0–40.0)

## 2020-08-19 MED ORDER — FUROSEMIDE 40 MG PO TABS: 40.0000 mg | ORAL_TABLET | Freq: Every day | ORAL | 2 refills | Status: AC

## 2020-08-19 MED ORDER — LISINOPRIL 20 MG PO TABS: 20.0000 mg | ORAL_TABLET | Freq: Every day | ORAL | 2 refills | Status: DC

## 2020-08-19 MED ORDER — AMLODIPINE BESYLATE 10 MG PO TABS: 10.0000 mg | ORAL_TABLET | Freq: Every day | ORAL | 2 refills | Status: DC

## 2020-08-19 MED ORDER — ROSUVASTATIN CALCIUM 20 MG PO TABS: 20.0000 mg | ORAL_TABLET | Freq: Every day | ORAL | 3 refills | Status: AC

## 2020-08-19 NOTE — Progress Notes (Signed)
Chief Complaint  Patient presents with   re-establish care    Subjective Andrew Cuevas is a 75 y.o. male who presents to reestablish care.  He moved back to the area not too long ago.  Hypertension He does not monitor home blood pressures. He is compliant with medications- Norvasc 10 mg/d, lisinopril 20 mg/d. Patient has these side effects of medication: none He is adhering to a healthy diet overall. Current exercise: none No Cp or SOB.  Hyperlipidemia Patient has a history of CVA. Currently not on a statin, is taking baby ASA. Diet/exercise as above.  The patient is not known to have coexisting coronary artery disease.   Past Medical History:  Diagnosis Date   Chronic headaches    CVA (cerebral vascular accident) (HCC)    HTN (hypertension), malignant     Exam BP 112/72   Pulse 78   Temp 98.2 F (36.8 C) (Oral)   Ht 5\' 9"  (1.753 m)   Wt 230 lb (104.3 kg)   SpO2 96%   BMI 33.97 kg/m  General:  well developed, well nourished, in no apparent distress Heart: RRR, no bruits, 2+ pitting lower extremity edema of the feet and ankles bilaterally Lungs: clear to auscultation, no accessory muscle use Neuro: Unsteady gait, very slow. Psych: Normal affect and mood  Essential hypertension - Plan: amLODipine (NORVASC) 10 MG tablet, lisinopril (ZESTRIL) 20 MG tablet  History of CVA with residual deficit - Plan: Comprehensive metabolic panel, Lipid panel  Hep C w/o coma, chronic (HCC)  Hemiplegia of right dominant side as late effect of cerebrovascular disease, unspecified cerebrovascular disease type, unspecified hemiplegia type (HCC), Chronic  Physical deconditioning  Chronic, stable.  Continue Norvasc 10 mg daily, lisinopril 20 mg daily. Counseled on diet and exercise. Chronic, overall stable.  He is not walking very well, we will get him set up with some home physical therapy as it is safer given ambulatory issues.  I will start a statin.  Continue aspirin daily.  Recheck  labs in 6 weeks. Reportedly finished Harvoni.  We will check LFTs. 4/5.  Home physical therapy. F/u in 6 weeks. The patient and his son voiced understanding and agreement to the plan.  [place], DO 08/19/20  10:55 AM

## 2020-08-19 NOTE — Patient Instructions (Addendum)
Give [place] 2-3 business days to get the results of your labs back.   Keep the diet clean and stay active.  The new Shingrix vaccine (for shingles) is a 2 shot series. It can make people feel low energy, achy and almost like they have the flu for 48 hours after injection. Please plan accordingly when deciding on when to get this shot. Call your pharmacy for an appointment to get this. The second shot of the series is less severe regarding the side effects, but it still lasts 48 hours.   I recommend getting the covid booster.   For the swelling in your lower extremities, be sure to elevate your legs when able, mind the salt intake, stay physically active and consider wearing compression stockings.  Let us know if you need anything.

## 2020-08-20 ENCOUNTER — Other Ambulatory Visit: Payer: Self-pay | Admitting: Family Medicine

## 2020-08-20 DIAGNOSIS — I6930 Unspecified sequelae of cerebral infarction: Secondary | ICD-10-CM

## 2020-08-20 DIAGNOSIS — R5381 Other malaise: Secondary | ICD-10-CM

## 2020-08-20 DIAGNOSIS — I10 Essential (primary) hypertension: Secondary | ICD-10-CM

## 2020-08-20 DIAGNOSIS — B182 Chronic viral hepatitis C: Secondary | ICD-10-CM

## 2020-08-23 ENCOUNTER — Telehealth: Payer: Self-pay | Admitting: Family Medicine

## 2020-08-23 NOTE — Telephone Encounter (Signed)
[facility] Call back # [phone]  Verbal order - PT  1 time a week for 1 week 2 times a week for 3 week  For Straightening, walking, stairs  OK to leave MSG

## 2020-08-23 NOTE — Telephone Encounter (Signed)
HHRN informed of verbal ok per PCP 

## 2020-09-30 ENCOUNTER — Encounter: Payer: Self-pay | Admitting: Family Medicine

## 2020-09-30 ENCOUNTER — Other Ambulatory Visit: Payer: Self-pay

## 2020-09-30 ENCOUNTER — Ambulatory Visit (INDEPENDENT_AMBULATORY_CARE_PROVIDER_SITE_OTHER): Payer: Medicare PPO | Admitting: Family Medicine

## 2020-09-30 VITALS — BP 146/98 | HR 84 | Temp 98.1°F | Ht 70.0 in | Wt 231.4 lb

## 2020-09-30 DIAGNOSIS — I10 Essential (primary) hypertension: Secondary | ICD-10-CM | POA: Diagnosis not present

## 2020-09-30 DIAGNOSIS — I6930 Unspecified sequelae of cerebral infarction: Secondary | ICD-10-CM | POA: Diagnosis not present

## 2020-09-30 DIAGNOSIS — Z23 Encounter for immunization: Secondary | ICD-10-CM

## 2020-09-30 DIAGNOSIS — Z0000 Encounter for general adult medical examination without abnormal findings: Secondary | ICD-10-CM

## 2020-09-30 LAB — COMPREHENSIVE METABOLIC PANEL
ALT: 12 U/L (ref 0–53)
AST: 16 U/L (ref 0–37)
Albumin: 4.5 g/dL (ref 3.5–5.2)
Alkaline Phosphatase: 54 U/L (ref 39–117)
BUN: 14 mg/dL (ref 6–23)
CO2: 22 mEq/L (ref 19–32)
Calcium: 9.2 mg/dL (ref 8.4–10.5)
Chloride: 106 mEq/L (ref 96–112)
Creatinine, Ser: 1.43 mg/dL (ref 0.40–1.50)
GFR: 47.37 mL/min — ABNORMAL LOW (ref >60.00)
Glucose, Bld: 87 mg/dL (ref 70–99)
Potassium: 3.9 mEq/L (ref 3.5–5.1)
Sodium: 137 mEq/L (ref 135–145)
Total Bilirubin: 0.8 mg/dL (ref 0.2–1.2)
Total Protein: 8.1 g/dL (ref 6.0–8.3)

## 2020-09-30 LAB — LIPID PANEL
Cholesterol: 113 mg/dL (ref 0–200)
HDL: 38.5 mg/dL — ABNORMAL LOW (ref >39.00)
LDL Cholesterol: 53 mg/dL (ref 0–99)
NonHDL: 74.55
Total CHOL/HDL Ratio: 3
Triglycerides: 106 mg/dL (ref 0.0–149.0)
VLDL: 21.2 mg/dL (ref 0.0–40.0)

## 2020-09-30 MED ORDER — LISINOPRIL-HYDROCHLOROTHIAZIDE 20-25 MG PO TABS: 1.0000 | ORAL_TABLET | Freq: Every day | ORAL | 2 refills | Status: DC

## 2020-09-30 MED ORDER — AMLODIPINE BESYLATE 5 MG PO TABS: 5.0000 mg | ORAL_TABLET | Freq: Every day | ORAL | 2 refills | Status: DC

## 2020-09-30 NOTE — Progress Notes (Signed)
Chief Complaint  Patient presents with   Annual Exam    Well Male Maribel Luis is here for a complete physical.  Here w family member.  His last physical was >1 year ago.  Current diet: in general, a "healthy" diet.   Current exercise: none Weight trend: stable Fatigue out of ordinary? No. Seat belt? Yes.    Hypertension Patient presents for hypertension follow up. He does monitor home blood pressures. Blood pressures ranging on average from 120-130's/70-80's. He is compliant with medications- amlodipine 10 mg/d, lisinopril 20 mg/d. Patient has these side effects of medication: none Diet/exercise as above.   Health maintenance Shingrix- No Tetanus- Yes Hep C- Yes Pneumonia vaccine- Has never had pcv20  Past Medical History:  Diagnosis Date   Chronic headaches    CVA (cerebral vascular accident) (HCC)    HTN (hypertension), malignant      Past Surgical History:  Procedure Laterality Date   HERNIA REPAIR Bilateral    HYDROCELE EXCISION Left 07/02/2016   Procedure: HYDROCELECTOMY ADULT;  Surgeon: Bjorn Pippin, MD;  Location: [facility];  Service: Urology;  Laterality: Left;    Medications  Current Outpatient Medications on File Prior to Visit  Medication Sig Dispense Refill   amLODipine (NORVASC) 10 MG tablet Take 1 tablet (10 mg total) by mouth daily. 90 tablet 2   aspirin EC 81 MG tablet Take 81 mg by mouth daily.     furosemide (LASIX) 40 MG tablet Take 1 tablet (40 mg total) by mouth daily. 90 tablet 2   lisinopril (ZESTRIL) 20 MG tablet Take 1 tablet (20 mg total) by mouth daily. 90 tablet 2   rosuvastatin (CRESTOR) 20 MG tablet Take 1 tablet (20 mg total) by mouth daily. 90 tablet 3   Allergies No Known Allergies  Family History Family History  Problem Relation Age of Onset   Hypertension Mother    Arthritis Mother    Alzheimer's disease Father    Stroke Sister     Review of Systems: Constitutional:  no fevers Eye:  no recent significant change in  vision Ears:  No changes in hearing Nose/Mouth/Throat:  no complaints of nasal congestion, no sore throat Cardiovascular: no chest pain Respiratory:  No shortness of breath Gastrointestinal:  No change in bowel habits GU:  No frequency Integumentary:  no abnormal skin lesions reported Neurologic:  no headaches Endocrine:  denies unexplained weight changes  Exam BP (!) 146/98   Pulse 84   Temp 98.1 F (36.7 C) (Oral)   Ht 5\' 10"  (1.778 m)   Wt 231 lb 6 oz (105 kg)   SpO2 96%   BMI 33.20 kg/m  General:  well developed, well nourished, in no apparent distress Skin:  no significant moles, warts, or growths Head:  no masses, lesions, or tenderness Eyes:  pupils equal and round, sclera anicteric without injection Ears:  canals without lesions, TMs shiny without retraction, no obvious effusion, no erythema Nose:  nares patent, septum midline, mucosa normal Throat/Pharynx:  lips and gingiva without lesion; tongue and uvula midline; non-inflamed pharynx; no exudates or postnasal drainage Lungs:  clear to auscultation, breath sounds equal bilaterally, no respiratory distress Cardio:  regular rate and rhythm, no LE edema or bruits Rectal: Deferred GI: BS+, S, NT, ND, no masses or organomegaly Musculoskeletal:  symmetrical muscle groups noted without atrophy or deformity Neuro:  gait normal; deep tendon reflexes normal and symmetric Psych: Nml affect and mood  Assessment and Plan  Well adult exam  History of CVA  with residual deficit - Plan: Comprehensive metabolic panel, Lipid panel  Essential hypertension - Plan: amLODipine (NORVASC) 5 MG tablet, lisinopril-hydrochlorothiazide (ZESTORETIC) 20-25 MG tablet   Well 75 y.o. male. Counseled on diet and exercise. Other orders as above.'HTN- chronic, uncontrolled. Decrease Norvasc to 5 mg/d from 10 mg/d due to swelling. Likely dependent edema but would feel better with this change. Add HCTZ 25 mg to regimen, will combine with lisinopril  in combo pill.  Follow up in 1 mo to reck BP.  The patient and his family member voiced understanding and agreement to the plan.  Jilda Roche [place], DO 09/30/20 11:05 AM

## 2020-09-30 NOTE — Addendum Note (Signed)
Addended by: Scharlene Gloss B on: 09/30/2020 11:33 AM   Modules accepted: Orders

## 2020-09-30 NOTE — Patient Instructions (Addendum)
Give [place] 2-3 business days to get the results of your labs back.   Keep the diet clean and stay active.  Aim to do some physical exertion for 150 minutes per week. This is typically divided into 5 days per week, 30 minutes per day. The activity should be enough to get your heart rate up. Anything is better than nothing if you have time constraints.  I recommend getting the flu shot in mid October. This suggestion would change if the CDC comes out with a different recommendation.   The new Shingrix vaccine (for shingles) is a 2 shot series. It can make people feel low energy, achy and almost like they have the flu for 48 hours after injection. Please plan accordingly when deciding on when to get this shot. Call our office for a nurse visit appointment to get this. The second shot of the series is less severe regarding the side effects, but it still lasts 48 hours.   I recommend getting the covid vaccine booster.  Take 1/2 tab of your amlodipine until you get the new dosage.  Stop the lisinopril once you get your new prescriptions.   Let us know if you need anything.

## 2020-11-05 ENCOUNTER — Ambulatory Visit: Payer: Medicare PPO

## 2020-11-06 ENCOUNTER — Ambulatory Visit (INDEPENDENT_AMBULATORY_CARE_PROVIDER_SITE_OTHER): Payer: Medicare PPO | Admitting: Family Medicine

## 2020-11-06 ENCOUNTER — Other Ambulatory Visit: Payer: Self-pay

## 2020-11-06 VITALS — BP 170/94 | HR 89

## 2020-11-06 DIAGNOSIS — I119 Hypertensive heart disease without heart failure: Secondary | ICD-10-CM | POA: Diagnosis not present

## 2020-11-06 NOTE — Progress Notes (Signed)
Pt here for Blood pressure check per PCP  Pt currently takes: amlodipine 5mg , lisinopril/hctz 20/25mg    Pt reports compliance with medication.  BP Readings from Last 3 Encounters:  09/30/20 (!) 146/98  08/19/20 112/72  08/01/20 (!) 147/86   Has not taken medication today yet.  Right BP today @ = 180/120 HR = 99  Left  BP today @ = 170/120 HR 91  Right Recheck BP-179/105 HR- 91  Left Recheck BP- 170/94 HR- 89  Pt advised per Dr. 10/01/20 to take 2 amlodipine and recheck in 1 week.  Patient has follow up next week and will recheck at that time.

## 2020-11-06 NOTE — Patient Instructions (Signed)
Per Dr. Marlene Bast take 2 amlodipine daily and 1 lisinopril/hctz daily.

## 2020-11-11 ENCOUNTER — Ambulatory Visit: Payer: Medicare PPO | Admitting: Family Medicine

## 2020-11-12 ENCOUNTER — Ambulatory Visit: Payer: Medicare PPO | Admitting: Family Medicine

## 2020-11-12 ENCOUNTER — Other Ambulatory Visit: Payer: Self-pay

## 2020-11-12 ENCOUNTER — Encounter: Payer: Self-pay | Admitting: Family Medicine

## 2020-11-12 VITALS — BP 154/96 | HR 84 | Temp 98.7°F | Ht 70.0 in | Wt 226.1 lb

## 2020-11-12 DIAGNOSIS — I119 Hypertensive heart disease without heart failure: Secondary | ICD-10-CM | POA: Diagnosis not present

## 2020-11-12 MED ORDER — SPIRONOLACTONE 25 MG PO TABS: 25.0000 mg | ORAL_TABLET | Freq: Every day | ORAL | 2 refills | Status: DC

## 2020-11-12 MED ORDER — AMLODIPINE BESYLATE 10 MG PO TABS: 10.0000 mg | ORAL_TABLET | Freq: Every day | ORAL | 2 refills | Status: DC

## 2020-11-12 NOTE — Patient Instructions (Signed)
Keep the diet clean and stay active.  Please continue to check blood pressure at home.   Let us know if you need anything.

## 2020-11-12 NOTE — Progress Notes (Signed)
Chief Complaint  Patient presents with   Follow-up    Subjective Andrew Cuevas is a 75 y.o. male who presents for hypertension follow up. Here w brother.  He does monitor home blood pressures. Blood pressures ranging from 160-180's/90's on average. He is compliant with medications- Norvasc 10 mg/d, Zestoretic 20-25 mg/d. Patient has these side effects of medication: none He is adhering to a healthy diet overall. Current exercise: none No CP or SOB.    Past Medical History:  Diagnosis Date   Chronic headaches    CVA (cerebral vascular accident) (HCC)    HTN (hypertension), malignant     Exam BP (!) 154/96   Pulse 84   Temp 98.7 F (37.1 C) (Oral)   Ht 5\' 10"  (1.778 m)   Wt 226 lb 2 oz (102.6 kg)   SpO2 98%   BMI 32.45 kg/m  General:  well developed, well nourished, in no apparent distress Heart: RRR, no bruits, no LE edema Lungs: clear to auscultation, no accessory muscle use Psych: Nml affect and mood.   Hypertensive heart disease without heart failure - Plan: spironolactone (ALDACTONE) 25 MG tablet, amLODipine (NORVASC) 10 MG tablet, Basic metabolic panel  Chronic, uncontrolled. Cont Norvasc 10 mg/d. Zestoretic 20-25 mg/d. Add spironolactone 25 mg/d. Counseled on diet and exercise. F/u in 1 week for labs, 2 weeks to reck. The patient and his brother voiced understanding and agreement to the plan.  [place], DO 11/12/20  10:32 AM

## 2020-11-19 ENCOUNTER — Ambulatory Visit: Payer: Medicare PPO | Attending: Internal Medicine

## 2020-11-19 ENCOUNTER — Other Ambulatory Visit (INDEPENDENT_AMBULATORY_CARE_PROVIDER_SITE_OTHER): Payer: Medicare PPO

## 2020-11-19 ENCOUNTER — Other Ambulatory Visit: Payer: Self-pay

## 2020-11-19 DIAGNOSIS — I119 Hypertensive heart disease without heart failure: Secondary | ICD-10-CM

## 2020-11-19 DIAGNOSIS — Z23 Encounter for immunization: Secondary | ICD-10-CM

## 2020-11-19 LAB — BASIC METABOLIC PANEL
BUN: 17 mg/dL (ref 6–23)
CO2: 22 mEq/L (ref 19–32)
Calcium: 9.5 mg/dL (ref 8.4–10.5)
Chloride: 106 mEq/L (ref 96–112)
Creatinine, Ser: 1.44 mg/dL (ref 0.40–1.50)
GFR: 46.93 mL/min — ABNORMAL LOW (ref >60.00)
Glucose, Bld: 85 mg/dL (ref 70–99)
Potassium: 3.8 mEq/L (ref 3.5–5.1)
Sodium: 137 mEq/L (ref 135–145)

## 2020-11-19 NOTE — Progress Notes (Signed)
   Covid-19 Vaccination Clinic  Name:  Tejuan Gholson    MRN: [id number] DOB: Apr 22, 1945  11/19/2020  Mr. Landress was observed post Covid-19 immunization for 15 minutes without incident. He was provided with Vaccine Information Sheet and instruction to access the V-Safe system.   Mr. Wisenbaker was instructed to call 911 with any severe reactions post vaccine: Difficulty breathing  Swelling of face and throat  A fast heartbeat  A bad rash all over body  Dizziness and weakness

## 2020-11-26 ENCOUNTER — Ambulatory Visit: Payer: Medicare PPO | Admitting: Family Medicine

## 2020-11-26 ENCOUNTER — Other Ambulatory Visit (HOSPITAL_BASED_OUTPATIENT_CLINIC_OR_DEPARTMENT_OTHER): Payer: Self-pay

## 2020-11-26 ENCOUNTER — Encounter: Payer: Self-pay | Admitting: Family Medicine

## 2020-11-26 ENCOUNTER — Other Ambulatory Visit: Payer: Self-pay

## 2020-11-26 VITALS — BP 144/84 | HR 78 | Temp 98.0°F | Ht 70.0 in | Wt 232.1 lb

## 2020-11-26 DIAGNOSIS — Z23 Encounter for immunization: Secondary | ICD-10-CM

## 2020-11-26 DIAGNOSIS — I10 Essential (primary) hypertension: Secondary | ICD-10-CM | POA: Diagnosis not present

## 2020-11-26 MED ORDER — COVID-19MRNA BIVAL VACC PFIZER 30 MCG/0.3ML IM SUSP
INTRAMUSCULAR | 0 refills | Status: AC
  Filled 2020-11-26: qty 0.3, 1d supply, fill #0

## 2020-11-26 MED ORDER — SPIRONOLACTONE 25 MG PO TABS: 25.0000 mg | ORAL_TABLET | Freq: Every day | ORAL | 2 refills | Status: AC

## 2020-11-26 NOTE — Progress Notes (Signed)
Chief Complaint  Patient presents with   Follow-up    Subjective Andrew Cuevas is a 75 y.o. male who presents for hypertension follow up. Here w brother.  He does not monitor home blood pressures. He is compliant with medications- Norvasc 10 mg/d, spironolactone 25 mg/d, Zestoretic 20-25 mg/d.  Patient has these side effects of medication: none He is adhering to a healthy diet overall. Current exercise: some walking No CP or SOB.    Past Medical History:  Diagnosis Date   Chronic headaches    CVA (cerebral vascular accident) (HCC)    HTN (hypertension), malignant     Exam BP (!) 144/84   Pulse 78   Temp 98 F (36.7 C) (Oral)   Ht 5\' 10"  (1.778 m)   Wt 232 lb 2 oz (105.3 kg)   SpO2 99%   BMI 33.31 kg/m  General:  well developed, well nourished, in no apparent distress Heart: RRR, no bruits, no LE edema Lungs: clear to auscultation, no accessory muscle use Psych: nml mood  Essential hypertension - Plan: spironolactone (ALDACTONE) 25 MG tablet  Chronic, stable. Cont Aldactone 25 mg/d, Norvasc 10 mg/d, Zestoretic 20-25 mg/d. Counseled on diet and exercise. OK to stop checking BP at home.  F/u in 6 mo or prn. The patient and his brother voiced understanding and agreement to the plan.  [place], DO 11/26/20  10:54 AM

## 2020-11-26 NOTE — Patient Instructions (Addendum)
Keep the diet clean and stay active.  Because your blood pressure is well-controlled, you no longer have to check your blood pressure at home anymore unless you wish. Some people check it twice daily every day and some people stop altogether. Either or anything in between is fine. Strong work! Goal is <150/90 if you do check your blood pressure.   Let us know if you need anything.

## 2020-11-26 NOTE — Addendum Note (Signed)
Addended by: Scharlene Gloss B on: 11/26/2020 10:59 AM   Modules accepted: Orders

## 2021-01-03 ENCOUNTER — Telehealth: Payer: Self-pay | Admitting: Family Medicine

## 2021-01-03 NOTE — Telephone Encounter (Signed)
Left message for patient to call back and schedule Medicare Annual Wellness Visit (AWV) in office.  ° °If not able to come in office, please offer to do virtually or by telephone.  Left office number and my jabber #[phone]. ° °Due for AWVI ° °Please schedule at anytime with Nurse Health Advisor. °  °

## 2021-05-30 ENCOUNTER — Ambulatory Visit (INDEPENDENT_AMBULATORY_CARE_PROVIDER_SITE_OTHER): Payer: Medicare PPO

## 2021-05-30 VITALS — Ht 70.0 in | Wt 232.0 lb

## 2021-05-30 DIAGNOSIS — Z0000 Encounter for general adult medical examination without abnormal findings: Secondary | ICD-10-CM | POA: Diagnosis not present

## 2021-05-30 NOTE — Patient Instructions (Signed)
Andrew Cuevas , ?Thank you for taking time to come for your Medicare Wellness Visit. I appreciate your ongoing commitment to your health goals. Please review the following plan we discussed and let me know if I can assist you in the future.  ? ?Screening recommendations/referrals: ?Colonoscopy: No longer required due to age.  ? ?Recommended yearly ophthalmology/optometry visit for glaucoma screening and checkup ?Recommended yearly dental visit for hygiene and checkup ? ?Vaccinations: ?Influenza vaccine: Done 11/26/2020. Repeat annually ? ?Pneumococcal vaccine: Done 09/30/2020 and 08/15/2018 ?Tdap vaccine: Due Repeat in 10 years ? ?Shingles vaccine: Discussed.   ?Covid-19: Done 11/19/2020, 04/19/2019, 03/20/2019 ? ?Advanced directives: Please bring a copy of your health care power of attorney and living will to the office to be added to your chart at your convenience. ? ? ?Conditions/risks identified: Aim for 30 minutes of exercise or brisk walking, 6-8 glasses of water, and 5 servings of fruits and vegetables each day. ? ? ?Next appointment: Follow up in one year for your annual wellness visit. 2024 ? ?Preventive Care 36 Years and Older, Male ? ?Preventive care refers to lifestyle choices and visits with your health care provider that can promote health and wellness. ?What does preventive care include? ?A yearly physical exam. This is also called an annual well check. ?Dental exams once or twice a year. ?Routine eye exams. Ask your health care provider how often you should have your eyes checked. ?Personal lifestyle choices, including: ?Daily care of your teeth and gums. ?Regular physical activity. ?Eating a healthy diet. ?Avoiding tobacco and drug use. ?Limiting alcohol use. ?Practicing safe sex. ?Taking low doses of aspirin every day. ?Taking vitamin and mineral supplements as recommended by your health care provider. ?What happens during an annual well check? ?The services and screenings done by your health care provider  during your annual well check will depend on your age, overall health, lifestyle risk factors, and family history of disease. ?Counseling  ?Your health care provider may ask you questions about your: ?Alcohol use. ?Tobacco use. ?Drug use. ?Emotional well-being. ?Home and relationship well-being. ?Sexual activity. ?Eating habits. ?History of falls. ?Memory and ability to understand (cognition). ?Work and work Astronomer. ?Screening  ?You may have the following tests or measurements: ?Height, weight, and BMI. ?Blood pressure. ?Lipid and cholesterol levels. These may be checked every 5 years, or more frequently if you are over 40 years old. ?Skin check. ?Lung cancer screening. You may have this screening every year starting at age 77 if you have a 30-pack-year history of smoking and currently smoke or have quit within the past 15 years. ?Fecal occult blood test (FOBT) of the stool. You may have this test every year starting at age 27. ?Flexible sigmoidoscopy or colonoscopy. You may have a sigmoidoscopy every 5 years or a colonoscopy every 10 years starting at age 72. ?Prostate cancer screening. Recommendations will vary depending on your family history and other risks. ?Hepatitis C blood test. ?Hepatitis B blood test. ?Sexually transmitted disease (STD) testing. ?Diabetes screening. This is done by checking your blood sugar (glucose) after you have not eaten for a while (fasting). You may have this done every 1-3 years. ?Abdominal aortic aneurysm (AAA) screening. You may need this if you are a current or former smoker. ?Osteoporosis. You may be screened starting at age 41 if you are at high risk. ?Talk with your health care provider about your test results, treatment options, and if necessary, the need for more tests. ?Vaccines  ?Your health care provider may recommend certain  vaccines, such as: ?Influenza vaccine. This is recommended every year. ?Tetanus, diphtheria, and acellular pertussis (Tdap, Td) vaccine. You  may need a Td booster every 10 years. ?Zoster vaccine. You may need this after age 61. ?Pneumococcal 13-valent conjugate (PCV13) vaccine. One dose is recommended after age 1. ?Pneumococcal polysaccharide (PPSV23) vaccine. One dose is recommended after age 61. ?Talk to your health care provider about which screenings and vaccines you need and how often you need them. ?This information is not intended to replace advice given to you by your health care provider. Make sure you discuss any questions you have with your health care provider. ?Document Released: 03/15/2015 Document Revised: 11/06/2015 Document Reviewed: 12/18/2014 ?Elsevier Interactive Patient Education ? 2017 [place]. ? ?Fall Prevention in the Home ?Falls can cause injuries. They can happen to people of all ages. There are many things you can do to make your home safe and to help prevent falls. ?What can I do on the outside of my home? ?Regularly fix the edges of walkways and driveways and fix any cracks. ?Remove anything that might make you trip as you walk through a door, such as a raised step or threshold. ?Trim any bushes or trees on the path to your home. ?Use bright outdoor lighting. ?Clear any walking paths of anything that might make someone trip, such as rocks or tools. ?Regularly check to see if handrails are loose or broken. Make sure that both sides of any steps have handrails. ?Any raised decks and porches should have guardrails on the edges. ?Have any leaves, snow, or ice cleared regularly. ?Use sand or salt on walking paths during winter. ?Clean up any spills in your garage right away. This includes oil or grease spills. ?What can I do in the bathroom? ?Use night lights. ?Install grab bars by the toilet and in the tub and shower. Do not use towel bars as grab bars. ?Use non-skid mats or decals in the tub or shower. ?If you need to sit down in the shower, use a plastic, non-slip stool. ?Keep the floor dry. Clean up any water that spills  on the floor as soon as it happens. ?Remove soap buildup in the tub or shower regularly. ?Attach bath mats securely with double-sided non-slip rug tape. ?Do not have throw rugs and other things on the floor that can make you trip. ?What can I do in the bedroom? ?Use night lights. ?Make sure that you have a light by your bed that is easy to reach. ?Do not use any sheets or blankets that are too big for your bed. They should not hang down onto the floor. ?Have a firm chair that has side arms. You can use this for support while you get dressed. ?Do not have throw rugs and other things on the floor that can make you trip. ?What can I do in the kitchen? ?Clean up any spills right away. ?Avoid walking on wet floors. ?Keep items that you use a lot in easy-to-reach places. ?If you need to reach something above you, use a strong step stool that has a grab bar. ?Keep electrical cords out of the way. ?Do not use floor polish or wax that makes floors slippery. If you must use wax, use non-skid floor wax. ?Do not have throw rugs and other things on the floor that can make you trip. ?What can I do with my stairs? ?Do not leave any items on the stairs. ?Make sure that there are handrails on both sides of the stairs  and use them. Fix handrails that are broken or loose. Make sure that handrails are as long as the stairways. ?Check any carpeting to make sure that it is firmly attached to the stairs. Fix any carpet that is loose or worn. ?Avoid having throw rugs at the top or bottom of the stairs. If you do have throw rugs, attach them to the floor with carpet tape. ?Make sure that you have a light switch at the top of the stairs and the bottom of the stairs. If you do not have them, ask someone to add them for you. ?What else can I do to help prevent falls? ?Wear shoes that: ?Do not have high heels. ?Have rubber bottoms. ?Are comfortable and fit you well. ?Are closed at the toe. Do not wear sandals. ?If you use a stepladder: ?Make  sure that it is fully opened. Do not climb a closed stepladder. ?Make sure that both sides of the stepladder are locked into place. ?Ask someone to hold it for you, if possible. ?Clearly mark and make sur

## 2021-05-30 NOTE — Progress Notes (Signed)
? ?Subjective:  ? Andrew Cuevas is a 76 y.o. male who presents for an Initial Medicare Annual Wellness Visit. ?Virtual Visit via Telephone Note ? ?I connected with  Andrew Cuevas on 05/30/21 at  2:30 PM EDT by telephone and verified that I am speaking with the correct person using two identifiers. ? ?Location: ?Patient: HOME ?Provider: [facility] ?Persons participating in the virtual visit: patient/Nurse Health Advisor ?  ?I discussed the limitations, risks, security and privacy concerns of performing an evaluation and management service by telephone and the availability of in person appointments. The patient expressed understanding and agreed to proceed. ? ?Interactive audio and video telecommunications were attempted between this nurse and patient, however failed, due to patient having technical difficulties OR patient did not have access to video capability.  We continued and completed visit with audio only. ? ?Some vital signs may be absent or patient reported.  ? ?Andrew Driver, LPN ? ?Review of Systems    ? ?Cardiac Risk Factors include: advanced age (>25men, >91 women);hypertension;male gender;sedentary lifestyle;obesity (BMI >30kg/m2) ? ?   ?Objective:  ?  ?Today's Vitals  ? 05/30/21 1430  ?Weight: 232 lb (105.2 kg)  ?Height: 5\' 10"  (1.778 m)  ? ?Body mass index is 33.29 kg/m?. ? ? ?  05/30/2021  ?  2:36 PM 07/15/2016  ?  3:07 AM 07/14/2016  ?  3:09 PM 07/02/2016  ?  8:01 AM 04/08/2016  ?  3:52 PM 04/02/2016  ? 10:00 PM 04/02/2016  ?  1:04 PM  ?Advanced Directives  ?Does Patient Have a Medical Advance Directive? Yes No No No Yes No No  ?Type of Paramedic of [place];Living will    [place];Living will    ?Copy of [place] in Chart? No - copy requested    No - copy requested    ?Would patient like information on creating a medical advance directive? No - Patient declined No - Patient declined No - Patient declined  No - Patient declined No - Patient  declined No - Patient declined  ? ? ?Current Medications (verified) ?Outpatient Encounter Medications as of 05/30/2021  ?Medication Sig  ? lisinopril (ZESTRIL) 20 MG tablet Take 1 tablet by mouth daily.  ? mirabegron ER (MYRBETRIQ) 50 MG TB24 tablet Take 1 tablet by mouth daily.  ? acetaminophen (TYLENOL) 650 MG CR tablet Take by mouth.  ? amLODipine (NORVASC) 10 MG tablet Take 1 tablet (10 mg total) by mouth daily.  ? aspirin EC 81 MG tablet Take 81 mg by mouth daily.  ? Cholecalciferol 125 MCG (5000 UT) TABS Take 1 tablet by mouth daily.  ? COVID-19 mRNA bivalent vaccine, Pfizer, injection Inject into the muscle.  ? furosemide (LASIX) 40 MG tablet Take 1 tablet (40 mg total) by mouth daily.  ? rosuvastatin (CRESTOR) 20 MG tablet Take 1 tablet (20 mg total) by mouth daily.  ? spironolactone (ALDACTONE) 25 MG tablet Take 1 tablet (25 mg total) by mouth daily.  ? [DISCONTINUED] lisinopril-hydrochlorothiazide (ZESTORETIC) 20-25 MG tablet Take 1 tablet by mouth daily.  ? ?No facility-administered encounter medications on file as of 05/30/2021.  ? ? ?Allergies (verified) ?Patient has no known allergies.  ? ?History: ?Past Medical History:  ?Diagnosis Date  ? Chronic headaches   ? CVA (cerebral vascular accident) [facility])   ? HTN (hypertension), malignant   ? ?Past Surgical History:  ?Procedure Laterality Date  ? HERNIA REPAIR Bilateral   ? HYDROCELE EXCISION Left 07/02/2016  ?  Procedure: HYDROCELECTOMY ADULT;  Surgeon: Irine Seal, MD;  Location: [facility];  Service: Urology;  Laterality: Left;  ? ?Family History  ?Problem Relation Age of Onset  ? Hypertension Mother   ? Arthritis Mother   ? Alzheimer's disease Father   ? Stroke Sister   ? ?Social History  ? ?Socioeconomic History  ? Marital status: Widowed  ?  Spouse name: Not on file  ? Number of children: Not on file  ? Years of education: Not on file  ? Highest education level: Not on file  ?Occupational History  ? Not on file  ?Tobacco Use  ? Smoking status: Former  ?   Packs/day: 0.50  ?  Types: Cigarettes  ?  Quit date: 04/27/2016  ?  Years since quitting: 5.0  ? Smokeless tobacco: Never  ?Vaping Use  ? Vaping Use: Never used  ?Substance and Sexual Activity  ? Alcohol use: No  ? Drug use: No  ?  Types: "Crack" cocaine  ? Sexual activity: Not Currently  ?Other Topics Concern  ? Not on file  ?Social History Narrative  ? Lives with sister.   ? ?Social Determinants of Health  ? ?Financial Resource Strain: Low Risk   ? Difficulty of Paying Living Expenses: Not hard at all  ?Food Insecurity: No Food Insecurity  ? Worried About Charity fundraiser in the Last Year: Never true  ? Ran Out of Food in the Last Year: Never true  ?Transportation Needs: No Transportation Needs  ? Lack of Transportation (Medical): No  ? Lack of Transportation (Non-Medical): No  ?Physical Activity: Insufficiently Active  ? Days of Exercise per Week: 3 days  ? Minutes of Exercise per Session: 30 min  ?Stress: No Stress Concern Present  ? Feeling of Stress : Not at all  ?Social Connections: Socially Isolated  ? Frequency of Communication with Friends and Family: More than three times a week  ? Frequency of Social Gatherings with Friends and Family: More than three times a week  ? Attends Religious Services: Never  ? Active Member of Clubs or Organizations: No  ? Attends Archivist Meetings: Never  ? Marital Status: Widowed  ? ? ?Tobacco Counseling ?Counseling given: Not Answered ? ? ?Clinical Intake: ? ?Pre-visit preparation completed: Yes ? ?Pain : No/denies pain ? ?  ? ?BMI - recorded: 33.29 ?Nutritional Status: BMI > 30  Obese ?Nutritional Risks: None ?Diabetes: No ? ?How often do you need to have someone help you when you read instructions, pamphlets, or other written materials from your doctor or pharmacy?: 1 - Never ? ?Diabetic?No ? ?Interpreter Needed?: No ? ?Information entered by :: mj Duvall Comes, lpn ? ? ?Activities of Daily Living ? ?  05/30/2021  ?  2:38 PM 08/19/2020  ? 10:31 AM  ?In your present  state of health, do you have any difficulty performing the following activities:  ?Hearing? 0 0  ?Vision? 0 1  ?Comment  has glasses  ?Difficulty concentrating or making decisions? 0 0  ?Walking or climbing stairs? 0 1  ?Dressing or bathing? 0 1  ?Doing errands, shopping? 0 1  ?Preparing Food and eating ? N   ?Using the Toilet? N   ?In the past six months, have you accidently leaked urine? Y   ?Do you have problems with loss of bowel control? N   ?Managing your Medications? N   ?Managing your Finances? N   ?Housekeeping or managing your Housekeeping? N   ? ? ?Patient Care Team: ?  Shelda Pal, DO as PCP - General (Family Medicine) ? ?Indicate any recent Medical Services you may have received from other than [facility] providers in the past year (date may be approximate). ? ?   ?Assessment:  ? This is a routine wellness examination for Rey. ? ?Hearing/Vision screen ?Hearing Screening - Comments:: No hearing issues.  ?Vision Screening - Comments:: Glasses. [facility]. 2022. ? ?Dietary issues and exercise activities discussed: ?Current Exercise Habits: Home exercise routine, Type of exercise: walking, Time (Minutes): 20, Frequency (Times/Week): 3, Weekly Exercise (Minutes/Week): 60, Intensity: Mild, Exercise limited by: cardiac condition(s) ? ? Goals Addressed   ? ?  ?  ?  ?  ? This Visit's Progress  ?  Exercise 3x per week (30 min per time)     ?  Try to move more.  ?  ? ?  ? ?Depression Screen ? ?  05/30/2021  ?  2:34 PM 09/30/2020  ? 10:41 AM 08/19/2020  ? 10:30 AM 06/29/2016  ?  2:42 PM  ?PHQ 2/9 Scores  ?PHQ - 2 Score 0 0 0 1  ?PHQ- 9 Score    7  ?  ?Fall Risk ? ?  05/30/2021  ?  2:36 PM 08/19/2020  ? 10:31 AM 03/15/2017  ? 10:52 AM 06/29/2016  ?  2:41 PM 06/08/2016  ?  3:04 PM  ?Fall Risk   ?Falls in the past year? 0 0 No No No  ?Number falls in past yr: 0 0     ?Injury with Fall? 0 0     ?Risk for fall due to : Impaired balance/gait;Impaired mobility      ?Follow up Falls prevention discussed Falls evaluation  completed     ? ? ?FALL RISK PREVENTION PERTAINING TO THE HOME: ? ?Any stairs in or around the home? Yes  ?If so, are there any without handrails? No  ?Home free of loose throw rugs in walkways, pet beds, electrical

## 2021-11-27 ENCOUNTER — Other Ambulatory Visit: Payer: Self-pay | Admitting: Family Medicine

## 2021-11-27 MED ORDER — LISINOPRIL 20 MG PO TABS: 20.0000 mg | ORAL_TABLET | Freq: Every day | ORAL | 0 refills | Status: DC

## 2021-12-01 ENCOUNTER — Other Ambulatory Visit: Payer: Self-pay | Admitting: Family Medicine

## 2021-12-01 DIAGNOSIS — I119 Hypertensive heart disease without heart failure: Secondary | ICD-10-CM

## 2021-12-16 ENCOUNTER — Ambulatory Visit: Payer: Medicare PPO | Admitting: Family Medicine

## 2021-12-25 ENCOUNTER — Other Ambulatory Visit: Payer: Self-pay | Admitting: Family Medicine

## 2021-12-29 ENCOUNTER — Ambulatory Visit: Payer: Medicare PPO | Admitting: Family Medicine

## 2022-01-05 ENCOUNTER — Ambulatory Visit: Payer: Medicare PPO | Admitting: Family Medicine

## 2022-01-08 ENCOUNTER — Other Ambulatory Visit: Payer: Self-pay | Admitting: Family Medicine

## 2022-06-02 ENCOUNTER — Telehealth: Payer: Self-pay | Admitting: Family Medicine

## 2022-06-02 NOTE — Telephone Encounter (Signed)
Contacted Chipper Balke to schedule their annual wellness visit. Appointment made for 06/11/2022.  Sherol Dade; Care Guide Ambulatory Clinical Support [facility] Direct Dial: [phone]

## 2022-06-22 ENCOUNTER — Ambulatory Visit (INDEPENDENT_AMBULATORY_CARE_PROVIDER_SITE_OTHER): Payer: Medicare Other | Admitting: *Deleted

## 2022-06-22 DIAGNOSIS — Z0000 Encounter for general adult medical examination without abnormal findings: Secondary | ICD-10-CM

## 2022-06-22 NOTE — Patient Instructions (Signed)
Andrew Cuevas , Thank you for taking time to come for your Medicare Wellness Visit. I appreciate your ongoing commitment to your health goals. Please review the following plan we discussed and let me know if I can assist you in the future.      This is a list of the screening recommended for you and due dates:  Health Maintenance  Topic Date Due   DTaP/Tdap/Td vaccine (1 - Tdap) Never done   Screening for Lung Cancer  Never done   Zoster (Shingles) Vaccine (1 of 2) Never done   COVID-19 Vaccine (5 - 2023-24 season) 10/31/2021   Flu Shot  10/01/2022   Medicare Annual Wellness Visit  06/22/2023   Pneumonia Vaccine  Completed   Hepatitis C Screening: USPSTF Recommendation to screen - Ages 18-79 yo.  Completed   HPV Vaccine  Aged Out    Next appointment: Follow up in one year for your annual wellness visit.   Preventive Care 79 Years and Older, Male Preventive care refers to lifestyle choices and visits with your health care provider that can promote health and wellness. What does preventive care include? A yearly physical exam. This is also called an annual well check. Dental exams once or twice a year. Routine eye exams. Ask your health care provider how often you should have your eyes checked. Personal lifestyle choices, including: Daily care of your teeth and gums. Regular physical activity. Eating a healthy diet. Avoiding tobacco and drug use. Limiting alcohol use. Practicing safe sex. Taking low doses of aspirin every day. Taking vitamin and mineral supplements as recommended by your health care provider. What happens during an annual well check? The services and screenings done by your health care provider during your annual well check will depend on your age, overall health, lifestyle risk factors, and family history of disease. Counseling  Your health care provider may ask you questions about your: Alcohol use. Tobacco use. Drug use. Emotional well-being. Home and  relationship well-being. Sexual activity. Eating habits. History of falls. Memory and ability to understand (cognition). Work and work Astronomer. Screening  You may have the following tests or measurements: Height, weight, and BMI. Blood pressure. Lipid and cholesterol levels. These may be checked every 5 years, or more frequently if you are over 79 years old. Skin check. Lung cancer screening. You may have this screening every year starting at age 79 if you have a 30-pack-year history of smoking and currently smoke or have quit within the past 15 years. Fecal occult blood test (FOBT) of the stool. You may have this test every year starting at age 79. Flexible sigmoidoscopy or colonoscopy. You may have a sigmoidoscopy every 5 years or a colonoscopy every 10 years starting at age 79. Prostate cancer screening. Recommendations will vary depending on your family history and other risks. Hepatitis C blood test. Hepatitis B blood test. Sexually transmitted disease (STD) testing. Diabetes screening. This is done by checking your blood sugar (glucose) after you have not eaten for a while (fasting). You may have this done every 1-3 years. Abdominal aortic aneurysm (AAA) screening. You may need this if you are a current or former smoker. Osteoporosis. You may be screened starting at age 79 if you are at high risk. Talk with your health care provider about your test results, treatment options, and if necessary, the need for more tests. Vaccines  Your health care provider may recommend certain vaccines, such as: Influenza vaccine. This is recommended every year. Tetanus, diphtheria, and acellular  pertussis (Tdap, Td) vaccine. You may need a Td booster every 10 years. Zoster vaccine. You may need this after age 79. Pneumococcal 13-valent conjugate (PCV13) vaccine. One dose is recommended after age 46. Pneumococcal polysaccharide (PPSV23) vaccine. One dose is recommended after age 49. Talk to your  health care provider about which screenings and vaccines you need and how often you need them. This information is not intended to replace advice given to you by your health care provider. Make sure you discuss any questions you have with your health care provider. Document Released: 03/15/2015 Document Revised: 11/06/2015 Document Reviewed: 12/18/2014 Elsevier Interactive Patient Education  2017 [employer].  Fall Prevention in the Home Falls can cause injuries. They can happen to people of all ages. There are many things you can do to make your home safe and to help prevent falls. What can I do on the outside of my home? Regularly fix the edges of walkways and driveways and fix any cracks. Remove anything that might make you trip as you walk through a door, such as a raised step or threshold. Trim any bushes or trees on the path to your home. Use bright outdoor lighting. Clear any walking paths of anything that might make someone trip, such as rocks or tools. Regularly check to see if handrails are loose or broken. Make sure that both sides of any steps have handrails. Any raised decks and porches should have guardrails on the edges. Have any leaves, snow, or ice cleared regularly. Use sand or salt on walking paths during winter. Clean up any spills in your garage right away. This includes oil or grease spills. What can I do in the bathroom? Use night lights. Install grab bars by the toilet and in the tub and shower. Do not use towel bars as grab bars. Use non-skid mats or decals in the tub or shower. If you need to sit down in the shower, use a plastic, non-slip stool. Keep the floor dry. Clean up any water that spills on the floor as soon as it happens. Remove soap buildup in the tub or shower regularly. Attach bath mats securely with double-sided non-slip rug tape. Do not have throw rugs and other things on the floor that can make you trip. What can I do in the bedroom? Use night  lights. Make sure that you have a light by your bed that is easy to reach. Do not use any sheets or blankets that are too big for your bed. They should not hang down onto the floor. Have a firm chair that has side arms. You can use this for support while you get dressed. Do not have throw rugs and other things on the floor that can make you trip. What can I do in the kitchen? Clean up any spills right away. Avoid walking on wet floors. Keep items that you use a lot in easy-to-reach places. If you need to reach something above you, use a strong step stool that has a grab bar. Keep electrical cords out of the way. Do not use floor polish or wax that makes floors slippery. If you must use wax, use non-skid floor wax. Do not have throw rugs and other things on the floor that can make you trip. What can I do with my stairs? Do not leave any items on the stairs. Make sure that there are handrails on both sides of the stairs and use them. Fix handrails that are broken or loose. Make sure that handrails  are as long as the stairways. Check any carpeting to make sure that it is firmly attached to the stairs. Fix any carpet that is loose or worn. Avoid having throw rugs at the top or bottom of the stairs. If you do have throw rugs, attach them to the floor with carpet tape. Make sure that you have a light switch at the top of the stairs and the bottom of the stairs. If you do not have them, ask someone to add them for you. What else can I do to help prevent falls? Wear shoes that: Do not have high heels. Have rubber bottoms. Are comfortable and fit you well. Are closed at the toe. Do not wear sandals. If you use a stepladder: Make sure that it is fully opened. Do not climb a closed stepladder. Make sure that both sides of the stepladder are locked into place. Ask someone to hold it for you, if possible. Clearly mark and make sure that you can see: Any grab bars or handrails. First and last  steps. Where the edge of each step is. Use tools that help you move around (mobility aids) if they are needed. These include: Canes. Walkers. Scooters. Crutches. Turn on the lights when you go into a dark area. Replace any light bulbs as soon as they burn out. Set up your furniture so you have a clear path. Avoid moving your furniture around. If any of your floors are uneven, fix them. If there are any pets around you, be aware of where they are. Review your medicines with your doctor. Some medicines can make you feel dizzy. This can increase your chance of falling. Ask your doctor what other things that you can do to help prevent falls. This information is not intended to replace advice given to you by your health care provider. Make sure you discuss any questions you have with your health care provider. Document Released: 12/13/2008 Document Revised: 07/25/2015 Document Reviewed: 03/23/2014 Elsevier Interactive Patient Education  2017 [employer].

## 2022-06-22 NOTE — Progress Notes (Signed)
Subjective:   Andrew Cuevas is a 77 y.o. male who presents for Medicare Annual/Subsequent preventive examination.  I connected with  Andrew Cuevas on 06/22/22 by a audio enabled telemedicine application and verified that I am speaking with the correct person using two identifiers.  Patient Location: Home  Provider Location: Office/Clinic  I discussed the limitations of evaluation and management by telemedicine. The patient expressed understanding and agreed to proceed.   Review of Systems     Cardiac Risk Factors include: advanced age (>2men, >10 women);male gender;dyslipidemia;hypertension     Objective:    There were no vitals filed for this visit. There is no height or weight on file to calculate BMI.     06/22/2022    9:42 AM 05/30/2021    2:36 PM 07/15/2016    3:07 AM 07/14/2016    3:09 PM 07/02/2016    8:01 AM 04/08/2016    3:52 PM 04/02/2016   10:00 PM  Advanced Directives  Does Patient Have a Medical Advance Directive? Yes Yes No No No Yes No  Type of Estate agent of [place];Living will Healthcare Power of [place];Living will    Healthcare Power of [place];Living will   Copy of Healthcare Power of Attorney in Chart? No - copy requested No - copy requested    No - copy requested   Would patient like information on creating a medical advance directive?  No - Patient declined No - Patient declined No - Patient declined  No - Patient declined No - Patient declined    Current Medications (verified) Outpatient Encounter Medications as of 06/22/2022  Medication Sig   acetaminophen (TYLENOL) 650 MG CR tablet Take by mouth.   amLODipine (NORVASC) 10 MG tablet TAKE 1 TABLET BY MOUTH EVERY DAY   aspirin EC 81 MG tablet Take 81 mg by mouth daily.   Cholecalciferol 125 MCG (5000 UT) TABS Take 1 tablet by mouth daily.   COVID-19 mRNA bivalent vaccine, Pfizer, injection Inject into the muscle.   furosemide (LASIX) 40 MG tablet Take 1 tablet (40 mg total) by mouth  daily.   lisinopril (ZESTRIL) 20 MG tablet TAKE 1 TABLET BY MOUTH EVERY DAY   mirabegron ER (MYRBETRIQ) 50 MG TB24 tablet Take 1 tablet by mouth daily.   rosuvastatin (CRESTOR) 20 MG tablet Take 1 tablet (20 mg total) by mouth daily.   spironolactone (ALDACTONE) 25 MG tablet Take 1 tablet (25 mg total) by mouth daily.   No facility-administered encounter medications on file as of 06/22/2022.    Allergies (verified) Patient has no known allergies.   History: Past Medical History:  Diagnosis Date   Chronic headaches    CVA (cerebral vascular accident)    HTN (hypertension), malignant    Past Surgical History:  Procedure Laterality Date   HERNIA REPAIR Bilateral    HYDROCELE EXCISION Left 07/02/2016   Procedure: HYDROCELECTOMY ADULT;  Surgeon: Bjorn Pippin, MD;  Location: [facility];  Service: Urology;  Laterality: Left;   Family History  Problem Relation Age of Onset   Hypertension Mother    Arthritis Mother    Alzheimer's disease Father    Stroke Sister    Social History   Socioeconomic History   Marital status: Widowed    Spouse name: Not on file   Number of children: 1   Years of education: Not on file   Highest education level: Not on file  Occupational History   Not on file  Tobacco Use   Smoking status: Former  Packs/day: .5    Types: Cigarettes    Quit date: 04/27/2016    Years since quitting: 6.1   Smokeless tobacco: Never  Vaping Use   Vaping Use: Never used  Substance and Sexual Activity   Alcohol use: No   Drug use: No    Types: "Crack" cocaine   Sexual activity: Not Currently  Other Topics Concern   Not on file  Social History Narrative   Lives with daughter and grandson in [place].   Social Determinants of Health   Financial Resource Strain: Low Risk  (05/30/2021)   Overall Financial Resource Strain (CARDIA)    Difficulty of Paying Living Expenses: Not hard at all  Food Insecurity: No Food Insecurity (06/22/2022)   Hunger Vital Sign    Worried  About Running Out of Food in the Last Year: Never true    Ran Out of Food in the Last Year: Never true  Transportation Needs: No Transportation Needs (06/22/2022)   PRAPARE - Administrator, Civil Service (Medical): No    Lack of Transportation (Non-Medical): No  Physical Activity: Insufficiently Active (05/30/2021)   Exercise Vital Sign    Days of Exercise per Week: 3 days    Minutes of Exercise per Session: 30 min  Stress: No Stress Concern Present (05/30/2021)   [employer] of Occupational Health - Occupational Stress Questionnaire    Feeling of Stress : Not at all  Social Connections: Socially Isolated (05/30/2021)   Social Connection and Isolation Panel [NHANES]    Frequency of Communication with Friends and Family: More than three times a week    Frequency of Social Gatherings with Friends and Family: More than three times a week    Attends Religious Services: Never    Database administrator or Organizations: No    Attends Banker Meetings: Never    Marital Status: Widowed    Tobacco Counseling Counseling given: Not Answered   Clinical Intake:  Pre-visit preparation completed: Yes  Pain : No/denies pain  Nutritional Risks: None Diabetes: No  How often do you need to have someone help you when you read instructions, pamphlets, or other written materials from your doctor or pharmacy?: 1 - Never  Activities of Daily Living    06/22/2022    9:45 AM  In your present state of health, do you have any difficulty performing the following activities:  Hearing? 0  Vision? 0  Difficulty concentrating or making decisions? 0  Walking or climbing stairs? 0  Dressing or bathing? 0  Doing errands, shopping? 1  Comment son-in-law drives him  Quarry manager and eating ? N  Using the Toilet? N  In the past six months, have you accidently leaked urine? N  Do you have problems with loss of bowel control? N  Managing your Medications? N  Managing your  Finances? N  Housekeeping or managing your Housekeeping? N    Patient Care Team: Sharlene Dory, DO as PCP - General (Family Medicine)  Indicate any recent Medical Services you may have received from other than [facility] providers in the past year (date may be approximate).     Assessment:   This is a routine wellness examination for Andrew Cuevas.  Hearing/Vision screen No results found.  Dietary issues and exercise activities discussed: Current Exercise Habits: The patient does not participate in regular exercise at present, Exercise limited by: None identified   Goals Addressed   None    Depression Screen    06/22/2022  9:45 AM 05/30/2021    2:34 PM 09/30/2020   10:41 AM 08/19/2020   10:30 AM 06/29/2016    2:42 PM  PHQ 2/9 Scores  PHQ - 2 Score 0 0 0 0 1  PHQ- 9 Score     7    Fall Risk    06/22/2022    9:43 AM 05/30/2021    2:36 PM 08/19/2020   10:31 AM 03/15/2017   10:52 AM 06/29/2016    2:41 PM  Fall Risk   Falls in the past year? 0 0 0 No No  Number falls in past yr: 0 0 0    Injury with Fall? 0 0 0    Risk for fall due to : Impaired balance/gait Impaired balance/gait;Impaired mobility     Follow up Falls evaluation completed Falls prevention discussed Falls evaluation completed      FALL RISK PREVENTION PERTAINING TO THE HOME:  Any stairs in or around the home? Yes  If so, are there any without handrails? No  Home free of loose throw rugs in walkways, pet beds, electrical cords, etc? Yes  Adequate lighting in your home to reduce risk of falls? Yes   ASSISTIVE DEVICES UTILIZED TO PREVENT FALLS:  Life alert? No  Use of a cane, walker or w/c? Yes  Grab bars in the bathroom? No  Shower chair or bench in shower? No  Elevated toilet seat or a handicapped toilet? No   TIMED UP AND GO:  Was the test performed?  No, audio visit .   Cognitive Function:        06/22/2022    9:48 AM 05/30/2021    2:38 PM  6CIT Screen  What Year? 0 points 0 points  What  month? 3 points 3 points  What time? 0 points 0 points  Count back from 20 0 points 0 points  Months in reverse 4 points 4 points  Repeat phrase 8 points 4 points  Total Score 15 points 11 points    Immunizations Immunization History  Administered Date(s) Administered   Fluad Quad(high Dose 65+) 11/26/2020   Moderna Sars-Covid-2 Vaccination 03/20/2019, 04/19/2019   PNEUMOCOCCAL CONJUGATE-20 09/30/2020   Pfizer Covid-19 Vaccine Bivalent Booster 52yrs & up 11/19/2020, 11/19/2020   Pneumococcal Polysaccharide-23 08/15/2018    TDAP status: Due, Education has been provided regarding the importance of this vaccine. Advised may receive this vaccine at local pharmacy or Health Dept. Aware to provide a copy of the vaccination record if obtained from local pharmacy or Health Dept. Verbalized acceptance and understanding.  Flu Vaccine status: Up to date  Pneumococcal vaccine status: Up to date  Covid-19 vaccine status: Information provided on how to obtain vaccines.   Qualifies for Shingles Vaccine? Yes   Zostavax completed No   Shingrix Completed?: No.    Education has been provided regarding the importance of this vaccine. Patient has been advised to call insurance company to determine out of pocket expense if they have not yet received this vaccine. Advised may also receive vaccine at local pharmacy or Health Dept. Verbalized acceptance and understanding.  Screening Tests Health Maintenance  Topic Date Due   DTaP/Tdap/Td (1 - Tdap) Never done   Lung Cancer Screening  Never done   Zoster Vaccines- Shingrix (1 of 2) Never done   COVID-19 Vaccine (5 - 2023-24 season) 10/31/2021   Medicare Annual Wellness (AWV)  05/31/2022   INFLUENZA VACCINE  10/01/2022   Pneumonia Vaccine 68+ Years old  Completed   Hepatitis C Screening  Completed   HPV VACCINES  Aged Out    Health Maintenance  Health Maintenance Due  Topic Date Due   DTaP/Tdap/Td (1 - Tdap) Never done   Lung Cancer Screening   Never done   Zoster Vaccines- Shingrix (1 of 2) Never done   COVID-19 Vaccine (5 - 2023-24 season) 10/31/2021   Medicare Annual Wellness (AWV)  05/31/2022    Colorectal cancer screening: No longer required.   Lung Cancer Screening: (Low Dose CT Chest recommended if Age 63-80 years, 30 pack-year currently smoking OR have quit w/in 15years.) does not qualify.   Additional Screening:  Hepatitis C Screening: does qualify; Completed 08/20/20  Vision Screening: Recommended annual ophthalmology exams for early detection of glaucoma and other disorders of the eye. Is the patient up to date with their annual eye exam?  Yes  Who is the provider or what is the name of the office in which the patient attends annual eye exams? Can't remember name at this time If pt is not established with a provider, would they like to be referred to a provider to establish care? No .   Dental Screening: Recommended annual dental exams for proper oral hygiene  Community Resource Referral / Chronic Care Management: CRR required this visit?  No   CCM required this visit?  No      Plan:     I have personally reviewed and noted the following in the patient's chart:   Medical and social history Use of alcohol, tobacco or illicit drugs  Current medications and supplements including opioid prescriptions. Patient is not currently taking opioid prescriptions. Functional ability and status Nutritional status Physical activity Advanced directives List of other physicians Hospitalizations, surgeries, and ER visits in previous 12 months Vitals Screenings to include cognitive, depression, and falls Referrals and appointments  In addition, I have reviewed and discussed with patient certain preventive protocols, quality metrics, and best practice recommendations. A written personalized care plan for preventive services as well as general preventive health recommendations were provided to patient.   Due to this  being a telephonic visit, the after visit summary with patients personalized plan was offered to patient via mail or my-chart. Patient would like to access on my-chart.  Andrew Cuevas, [place]   06/22/2022   Nurse Notes: None

## 2022-07-13 ENCOUNTER — Other Ambulatory Visit: Payer: Self-pay | Admitting: Family Medicine

## 2022-08-14 ENCOUNTER — Other Ambulatory Visit: Payer: Self-pay | Admitting: Family Medicine

## 2022-08-14 NOTE — Telephone Encounter (Signed)
Last OV with PCP was on 11/06/2020 Will need to schedule an OV to continue refills. Will call to schedule appointment,

## 2022-12-31 ENCOUNTER — Other Ambulatory Visit: Payer: Self-pay | Admitting: Family Medicine

## 2022-12-31 DIAGNOSIS — I119 Hypertensive heart disease without heart failure: Secondary | ICD-10-CM
